# Patient Record
Sex: Female | Born: 1965 | Race: White | Hispanic: No | Marital: Married | Smoking: Never smoker
Health system: Southern US, Community
[De-identification: ages and names within clinical notes are randomized; demographics above are authoritative.]

## PROBLEM LIST (undated history)

## (undated) DIAGNOSIS — R55 Syncope and collapse: Secondary | ICD-10-CM

## (undated) DIAGNOSIS — J309 Allergic rhinitis, unspecified: Secondary | ICD-10-CM

## (undated) DIAGNOSIS — I10 Essential (primary) hypertension: Secondary | ICD-10-CM

## (undated) DIAGNOSIS — I5189 Other ill-defined heart diseases: Secondary | ICD-10-CM

## (undated) DIAGNOSIS — E119 Type 2 diabetes mellitus without complications: Secondary | ICD-10-CM

## (undated) DIAGNOSIS — K219 Gastro-esophageal reflux disease without esophagitis: Secondary | ICD-10-CM

## (undated) DIAGNOSIS — Z86718 Personal history of other venous thrombosis and embolism: Secondary | ICD-10-CM

## (undated) DIAGNOSIS — F419 Anxiety disorder, unspecified: Secondary | ICD-10-CM

## (undated) DIAGNOSIS — M4800 Spinal stenosis, site unspecified: Secondary | ICD-10-CM

## (undated) DIAGNOSIS — E890 Postprocedural hypothyroidism: Secondary | ICD-10-CM

## (undated) DIAGNOSIS — E0590 Thyrotoxicosis, unspecified without thyrotoxic crisis or storm: Secondary | ICD-10-CM

## (undated) DIAGNOSIS — R600 Localized edema: Secondary | ICD-10-CM

## (undated) DIAGNOSIS — M51379 Other intervertebral disc degeneration, lumbosacral region without mention of lumbar back pain or lower extremity pain: Secondary | ICD-10-CM

## (undated) DIAGNOSIS — E042 Nontoxic multinodular goiter: Secondary | ICD-10-CM

## (undated) DIAGNOSIS — M5137 Other intervertebral disc degeneration, lumbosacral region: Secondary | ICD-10-CM

## (undated) DIAGNOSIS — E7800 Pure hypercholesterolemia, unspecified: Secondary | ICD-10-CM

## (undated) DIAGNOSIS — W57XXXA Bitten or stung by nonvenomous insect and other nonvenomous arthropods, initial encounter: Secondary | ICD-10-CM

## (undated) DIAGNOSIS — M19019 Primary osteoarthritis, unspecified shoulder: Secondary | ICD-10-CM

## (undated) DIAGNOSIS — T7840XA Allergy, unspecified, initial encounter: Secondary | ICD-10-CM

## (undated) HISTORY — PX: EXPLORATORY LAPAROTOMY: SUR591

## (undated) HISTORY — PX: ROTATOR CUFF REPAIR: SHX139

## (undated) HISTORY — PX: BONE MARROW BIOPSY: SHX199

## (undated) HISTORY — PX: ENDOMETRIAL ABLATION: SHX621

## (undated) HISTORY — PX: CHOLECYSTECTOMY: SHX55

## (undated) HISTORY — DX: Allergy, unspecified, initial encounter: T78.40XA

## (undated) HISTORY — PX: BACK SURGERY: SHX140

## (undated) HISTORY — PX: MOUTH SURGERY: SHX715

## (undated) HISTORY — PX: DILATION AND CURETTAGE OF UTERUS: SHX78

## (undated) HISTORY — PX: ABDOMINAL HYSTERECTOMY: SHX81

---

## 1998-11-18 ENCOUNTER — Other Ambulatory Visit: Admission: RE | Admit: 1998-11-18 | Discharge: 1998-11-18 | Payer: Self-pay | Admitting: Obstetrics and Gynecology

## 1999-01-27 ENCOUNTER — Ambulatory Visit (HOSPITAL_BASED_OUTPATIENT_CLINIC_OR_DEPARTMENT_OTHER): Admission: RE | Admit: 1999-01-27 | Discharge: 1999-01-27 | Payer: Self-pay | Admitting: Oral Surgery

## 1999-03-16 ENCOUNTER — Encounter: Payer: Self-pay | Admitting: Family Medicine

## 1999-03-16 ENCOUNTER — Ambulatory Visit (HOSPITAL_COMMUNITY): Admission: RE | Admit: 1999-03-16 | Discharge: 1999-03-16 | Payer: Self-pay | Admitting: Family Medicine

## 1999-05-27 ENCOUNTER — Encounter: Admission: RE | Admit: 1999-05-27 | Discharge: 1999-08-25 | Payer: Self-pay | Admitting: Obstetrics and Gynecology

## 1999-11-30 ENCOUNTER — Inpatient Hospital Stay (HOSPITAL_COMMUNITY): Admission: AD | Admit: 1999-11-30 | Discharge: 1999-12-02 | Payer: Self-pay | Admitting: Obstetrics and Gynecology

## 1999-12-28 ENCOUNTER — Other Ambulatory Visit: Admission: RE | Admit: 1999-12-28 | Discharge: 1999-12-28 | Payer: Self-pay | Admitting: Gynecology

## 2001-03-26 ENCOUNTER — Other Ambulatory Visit: Admission: RE | Admit: 2001-03-26 | Discharge: 2001-03-26 | Payer: Self-pay | Admitting: Obstetrics and Gynecology

## 2001-09-02 ENCOUNTER — Encounter: Payer: Self-pay | Admitting: Family Medicine

## 2001-09-02 ENCOUNTER — Ambulatory Visit (HOSPITAL_COMMUNITY): Admission: RE | Admit: 2001-09-02 | Discharge: 2001-09-02 | Payer: Self-pay | Admitting: Family Medicine

## 2002-07-04 ENCOUNTER — Encounter: Payer: Self-pay | Admitting: Family Medicine

## 2002-07-04 ENCOUNTER — Ambulatory Visit (HOSPITAL_COMMUNITY): Admission: RE | Admit: 2002-07-04 | Discharge: 2002-07-04 | Payer: Self-pay | Admitting: Family Medicine

## 2002-07-11 ENCOUNTER — Other Ambulatory Visit: Admission: RE | Admit: 2002-07-11 | Discharge: 2002-07-11 | Payer: Self-pay | Admitting: Obstetrics and Gynecology

## 2003-03-10 ENCOUNTER — Inpatient Hospital Stay (HOSPITAL_COMMUNITY): Admission: RE | Admit: 2003-03-10 | Discharge: 2003-03-13 | Payer: Self-pay | Admitting: Obstetrics and Gynecology

## 2003-03-10 ENCOUNTER — Encounter (INDEPENDENT_AMBULATORY_CARE_PROVIDER_SITE_OTHER): Payer: Self-pay | Admitting: *Deleted

## 2003-03-15 ENCOUNTER — Inpatient Hospital Stay (HOSPITAL_COMMUNITY): Admission: AD | Admit: 2003-03-15 | Discharge: 2003-03-15 | Payer: Self-pay | Admitting: Obstetrics and Gynecology

## 2003-03-18 ENCOUNTER — Inpatient Hospital Stay (HOSPITAL_COMMUNITY): Admission: AD | Admit: 2003-03-18 | Discharge: 2003-03-20 | Payer: Self-pay | Admitting: Obstetrics and Gynecology

## 2003-03-22 ENCOUNTER — Inpatient Hospital Stay (HOSPITAL_COMMUNITY): Admission: AD | Admit: 2003-03-22 | Discharge: 2003-03-24 | Payer: Self-pay | Admitting: Obstetrics and Gynecology

## 2003-03-23 ENCOUNTER — Encounter: Payer: Self-pay | Admitting: Obstetrics and Gynecology

## 2003-04-05 ENCOUNTER — Inpatient Hospital Stay (HOSPITAL_COMMUNITY): Admission: AD | Admit: 2003-04-05 | Discharge: 2003-04-05 | Payer: Self-pay | Admitting: *Deleted

## 2003-04-06 ENCOUNTER — Ambulatory Visit (HOSPITAL_COMMUNITY): Admission: RE | Admit: 2003-04-06 | Discharge: 2003-04-06 | Payer: Self-pay | Admitting: Obstetrics and Gynecology

## 2003-04-06 ENCOUNTER — Encounter: Payer: Self-pay | Admitting: Obstetrics and Gynecology

## 2003-04-07 ENCOUNTER — Encounter: Payer: Self-pay | Admitting: Obstetrics and Gynecology

## 2003-04-07 ENCOUNTER — Ambulatory Visit (HOSPITAL_COMMUNITY): Admission: RE | Admit: 2003-04-07 | Discharge: 2003-04-07 | Payer: Self-pay | Admitting: Obstetrics and Gynecology

## 2003-04-08 ENCOUNTER — Encounter: Payer: Self-pay | Admitting: Gastroenterology

## 2003-04-08 ENCOUNTER — Ambulatory Visit (HOSPITAL_COMMUNITY): Admission: RE | Admit: 2003-04-08 | Discharge: 2003-04-08 | Payer: Self-pay | Admitting: Gastroenterology

## 2003-04-28 ENCOUNTER — Observation Stay (HOSPITAL_COMMUNITY): Admission: RE | Admit: 2003-04-28 | Discharge: 2003-04-29 | Payer: Self-pay | Admitting: General Surgery

## 2003-04-28 ENCOUNTER — Encounter: Payer: Self-pay | Admitting: General Surgery

## 2003-04-28 ENCOUNTER — Encounter (INDEPENDENT_AMBULATORY_CARE_PROVIDER_SITE_OTHER): Payer: Self-pay

## 2003-07-06 ENCOUNTER — Ambulatory Visit (HOSPITAL_COMMUNITY): Admission: RE | Admit: 2003-07-06 | Discharge: 2003-07-06 | Payer: Self-pay | Admitting: Oncology

## 2003-07-06 ENCOUNTER — Encounter (INDEPENDENT_AMBULATORY_CARE_PROVIDER_SITE_OTHER): Payer: Self-pay | Admitting: *Deleted

## 2003-09-02 ENCOUNTER — Encounter: Admission: RE | Admit: 2003-09-02 | Discharge: 2003-09-02 | Payer: Self-pay | Admitting: Infectious Diseases

## 2003-10-02 ENCOUNTER — Ambulatory Visit (HOSPITAL_COMMUNITY): Admission: RE | Admit: 2003-10-02 | Discharge: 2003-10-02 | Payer: Self-pay | Admitting: Obstetrics and Gynecology

## 2003-10-28 ENCOUNTER — Encounter (INDEPENDENT_AMBULATORY_CARE_PROVIDER_SITE_OTHER): Payer: Self-pay | Admitting: Specialist

## 2003-10-28 ENCOUNTER — Inpatient Hospital Stay (HOSPITAL_COMMUNITY): Admission: AD | Admit: 2003-10-28 | Discharge: 2003-11-02 | Payer: Self-pay | Admitting: Obstetrics and Gynecology

## 2003-11-10 ENCOUNTER — Inpatient Hospital Stay (HOSPITAL_COMMUNITY): Admission: AD | Admit: 2003-11-10 | Discharge: 2003-11-10 | Payer: Self-pay | Admitting: Obstetrics and Gynecology

## 2003-11-19 ENCOUNTER — Ambulatory Visit (HOSPITAL_COMMUNITY): Admission: RE | Admit: 2003-11-19 | Discharge: 2003-11-19 | Payer: Self-pay | Admitting: Obstetrics and Gynecology

## 2003-12-03 ENCOUNTER — Ambulatory Visit (HOSPITAL_COMMUNITY): Admission: RE | Admit: 2003-12-03 | Discharge: 2003-12-03 | Payer: Self-pay | Admitting: Obstetrics and Gynecology

## 2004-03-22 ENCOUNTER — Other Ambulatory Visit: Admission: RE | Admit: 2004-03-22 | Discharge: 2004-03-22 | Payer: Self-pay | Admitting: Obstetrics and Gynecology

## 2005-05-11 ENCOUNTER — Other Ambulatory Visit: Admission: RE | Admit: 2005-05-11 | Discharge: 2005-05-11 | Payer: Self-pay | Admitting: Obstetrics and Gynecology

## 2005-09-24 ENCOUNTER — Emergency Department (HOSPITAL_COMMUNITY): Admission: EM | Admit: 2005-09-24 | Discharge: 2005-09-24 | Payer: Self-pay | Admitting: Emergency Medicine

## 2007-06-07 ENCOUNTER — Ambulatory Visit (HOSPITAL_COMMUNITY): Admission: RE | Admit: 2007-06-07 | Discharge: 2007-06-07 | Payer: Self-pay | Admitting: Obstetrics and Gynecology

## 2008-12-03 ENCOUNTER — Encounter: Admission: RE | Admit: 2008-12-03 | Discharge: 2008-12-03 | Payer: Self-pay | Admitting: Gastroenterology

## 2009-12-02 ENCOUNTER — Encounter: Admission: RE | Admit: 2009-12-02 | Discharge: 2009-12-02 | Payer: Self-pay | Admitting: Obstetrics and Gynecology

## 2010-03-16 ENCOUNTER — Encounter (INDEPENDENT_AMBULATORY_CARE_PROVIDER_SITE_OTHER): Payer: Self-pay | Admitting: Specialist

## 2010-03-17 ENCOUNTER — Inpatient Hospital Stay (HOSPITAL_COMMUNITY): Admission: RE | Admit: 2010-03-17 | Discharge: 2010-03-20 | Payer: Self-pay | Admitting: Specialist

## 2010-03-23 ENCOUNTER — Ambulatory Visit (HOSPITAL_COMMUNITY): Admission: RE | Admit: 2010-03-23 | Discharge: 2010-03-23 | Payer: Self-pay | Admitting: Specialist

## 2010-03-23 ENCOUNTER — Ambulatory Visit: Payer: Self-pay | Admitting: Surgery

## 2010-03-23 ENCOUNTER — Encounter (INDEPENDENT_AMBULATORY_CARE_PROVIDER_SITE_OTHER): Payer: Self-pay | Admitting: Specialist

## 2010-10-02 ENCOUNTER — Encounter: Payer: Self-pay | Admitting: Obstetrics and Gynecology

## 2010-11-27 LAB — URINALYSIS, ROUTINE W REFLEX MICROSCOPIC
Bilirubin Urine: NEGATIVE
Glucose, UA: >1000 mg/dL — AB
Hgb urine dipstick: NEGATIVE
Ketones, ur: NEGATIVE mg/dL
Nitrite: NEGATIVE
Protein, ur: NEGATIVE mg/dL
Specific Gravity, Urine: 1.027 (ref 1.005–1.030)
Urobilinogen, UA: 1 mg/dL (ref 0.0–1.0)
pH: 5.5 (ref 5.0–8.0)

## 2010-11-27 LAB — GLUCOSE, CAPILLARY
Glucose-Capillary: 117 mg/dL — ABNORMAL HIGH (ref 70–99)
Glucose-Capillary: 132 mg/dL — ABNORMAL HIGH (ref 70–99)
Glucose-Capillary: 135 mg/dL — ABNORMAL HIGH (ref 70–99)
Glucose-Capillary: 141 mg/dL — ABNORMAL HIGH (ref 70–99)
Glucose-Capillary: 151 mg/dL — ABNORMAL HIGH (ref 70–99)
Glucose-Capillary: 158 mg/dL — ABNORMAL HIGH (ref 70–99)
Glucose-Capillary: 165 mg/dL — ABNORMAL HIGH (ref 70–99)
Glucose-Capillary: 167 mg/dL — ABNORMAL HIGH (ref 70–99)
Glucose-Capillary: 172 mg/dL — ABNORMAL HIGH (ref 70–99)
Glucose-Capillary: 179 mg/dL — ABNORMAL HIGH (ref 70–99)
Glucose-Capillary: 180 mg/dL — ABNORMAL HIGH (ref 70–99)
Glucose-Capillary: 183 mg/dL — ABNORMAL HIGH (ref 70–99)
Glucose-Capillary: 215 mg/dL — ABNORMAL HIGH (ref 70–99)
Glucose-Capillary: 226 mg/dL — ABNORMAL HIGH (ref 70–99)
Glucose-Capillary: 228 mg/dL — ABNORMAL HIGH (ref 70–99)
Glucose-Capillary: 236 mg/dL — ABNORMAL HIGH (ref 70–99)
Glucose-Capillary: 243 mg/dL — ABNORMAL HIGH (ref 70–99)
Glucose-Capillary: 253 mg/dL — ABNORMAL HIGH (ref 70–99)
Glucose-Capillary: 253 mg/dL — ABNORMAL HIGH (ref 70–99)

## 2010-11-27 LAB — PROTIME-INR
INR: 0.92 (ref 0.00–1.49)
Prothrombin Time: 12.3 seconds (ref 11.6–15.2)

## 2010-11-27 LAB — COMPREHENSIVE METABOLIC PANEL
ALT: 35 U/L (ref 0–35)
AST: 20 U/L (ref 0–37)
Albumin: 3.9 g/dL (ref 3.5–5.2)
Alkaline Phosphatase: 112 U/L (ref 39–117)
BUN: 8 mg/dL (ref 6–23)
CO2: 34 mEq/L — ABNORMAL HIGH (ref 19–32)
Calcium: 9.3 mg/dL (ref 8.4–10.5)
Chloride: 99 mEq/L (ref 96–112)
Creatinine, Ser: 0.67 mg/dL (ref 0.4–1.2)
GFR calc Af Amer: >60 mL/min (ref >60)
GFR calc non Af Amer: >60 mL/min (ref >60)
Glucose, Bld: 269 mg/dL — ABNORMAL HIGH (ref 70–99)
Potassium: 3.4 mEq/L — ABNORMAL LOW (ref 3.5–5.1)
Sodium: 140 mEq/L (ref 135–145)
Total Bilirubin: 0.5 mg/dL (ref 0.3–1.2)
Total Protein: 6.9 g/dL (ref 6.0–8.3)

## 2010-11-27 LAB — CBC
HCT: 41.2 % (ref 36.0–46.0)
Hemoglobin: 14.6 g/dL (ref 12.0–15.0)
MCH: 30.8 pg (ref 26.0–34.0)
MCHC: 35.5 g/dL (ref 30.0–36.0)
MCV: 86.8 fL (ref 78.0–100.0)
Platelets: 233 10*3/uL (ref 150–400)
RBC: 4.74 MIL/uL (ref 3.87–5.11)
RDW: 12.6 % (ref 11.5–15.5)
WBC: 7 10*3/uL (ref 4.0–10.5)

## 2010-11-27 LAB — APTT: aPTT: 29 seconds (ref 24–37)

## 2010-11-27 LAB — HEMOGLOBIN A1C
Hgb A1c MFr Bld: 11.7 % — ABNORMAL HIGH (ref <5.7)
Mean Plasma Glucose: 289 mg/dL — ABNORMAL HIGH (ref <117)

## 2010-11-27 LAB — URINE MICROSCOPIC-ADD ON

## 2010-11-27 LAB — SURGICAL PCR SCREEN

## 2010-11-27 LAB — MRSA CULTURE

## 2011-01-27 NOTE — Op Note (Signed)
NAME:  Nicole Crane, Nicole Crane                      ACCOUNT NO.:  [id number]   MEDICAL RECORD NO.:  [id number]                   PATIENT TYPE:  AMB   LOCATION:  DAY                                  FACILITY:  [facility]   PHYSICIAN:  Timothy E. Earlene Plater, M.D.              DATE OF BIRTH:  Jul 23, 1966   DATE OF PROCEDURE:  04/28/2003  DATE OF DISCHARGE:                                 OPERATIVE REPORT   PREOPERATIVE DIAGNOSIS:  Acute and chronic acalculus cholecystitis.   POSTOPERATIVE DIAGNOSIS:  Acute and chronic acalculus cholecystitis.   PROCEDURE:  Laparoscopic cholecystectomy and operative cholangiogram.   SURGEON:  Timothy E. Earlene Plater, M.D.   ASSISTANT:  Lebron Conners, M.D.   ANESTHESIA:  General.   INDICATIONS:  Ms. Padgett is 67, has acute onset of right upper quadrant  pain (food related), with radiation to the back.  She had been in the  emergency room, has visited her primary care physician, her  gastroenterologist and the gynecologist, including me -- we all agree that  cholecystectomy is in order.  She is in agreement and is anxious to proceed.  The procedure has been carefully explained to her and her husband, as the  expectations and potential complications.  She was identified and the permit  signed.   DESCRIPTION OF PROCEDURE:  She was taken to the operating room, placed  supine, general endotracheal anesthesia administered.  The abdomen was  scrubbed, prepped and draped in the usual fashion.  A supraumbilical  vertical incision was made.  The fascia was very deep in the subcutaneous  fat; it was clearly identified, opened in the midline, grasped with  Kocher's, drawn up and the peritoneum entered without complication.  A #1  Vicryl stitch was placed across the fascia.  The Hasson catheter placed  through the suture, and it was tied.  The abdomen was insufflated.  The  patient was placed in the head-down position.  There was some inflammatory  change to the right lower  quadrant, but nothing acute or complicating.  One  picture was made.  She was then placed in the routine head-up position.  A  10 mm trocar placed in the mid epigastrium under direct vision.  Two 5 mm  trocars placed in the right upper quadrant under direct vision.  The  gallbladder was tense; had a few omental adhesions, and was at least 50%  intrahepatic.  It was grasped, carefully placed on tension.  Very careful  dissection at the base of gallbladder revealed an anterior cystic artery,  which was triply clipped and divided.  We then identified the cystic duct,  which was inferior.  This was very carefully dissected out and a complete  window was made posteriorly.  No other structures were seen, except a second  small artery.  A clip was placed at the junction of the cystic duct and  gallbladder.  A small incision was made in the cystic  duct and with a  percutaneously placed Reddick catheter, this was placed into the cystic duct  remnant; real-time fluoroscopy was used to complete a cholangiogram, which  showed smooth and rapid filling of the biliary system into the duodenum,  with further dye and in the head-down position the hepatic radicals also  filled.  They all seemed normal and there was no abnormality.  With this,  the clip was removed.  The cystic duct catheter removed, and the short  cystic duct remnant carefully doubly clipped.  It was fully divided.  The  artery was dissected posterior to this, and was triply clipped and divided.  The gallbladder was then removed from the gallbladder bed, with some  difficulty.  It was very intensely imbedded in the liver.  The gallbladder  was opened in two locations, with minor spillage of bile.  The gallbladder  was removed without further difficulty.  The gallbladder bed was carefully  inspected and was dry.  Copious irrigation was used.  The gallbladder was  placed in an Endo catch bag, and further irrigation carried out both in the   pelvis and in the upper abdomen.  The gallbladder was removed through the  supraumbilical incision, in the Endo catch bag, and submitted to pathology.  Further irrigation was carried out until completely clear.  Then, under  direct vision, all irrigation, CO2, instruments and trocars were removed.  Counts were correct.  She tolerated it well.  The skin incisions were  inspected and then closed with  4-0 Monocryl sutures.  Steri-Strips and dry sterile dressing applied.  Final  counts were correct.  She was removed to the recovery room in good  condition, after extubation.                                               Timothy E. Earlene Plater, M.D.    TED/MEDQ  D:  04/28/2003  T:  04/28/2003  Job:  [id number]   cc:   Marcelino Duster L. Vincente Poli, M.D.  [address]  [place]  [place] [address]  Fax: [phone]   Anselmo Rod, M.D.  [address]  [address]  [place]  [place] [address]  Fax: [phone]   Holley Bouche, M.D.  [address]  [place], [place] [address]  Fax: [phone]

## 2011-01-27 NOTE — Discharge Summary (Signed)
NAME:  Nicole Crane, Nicole Crane                      ACCOUNT NO.:  [id number]   MEDICAL RECORD NO.:  [id number]                   PATIENT TYPE:  INP   LOCATION:  9309                                 FACILITY:  [facility]   PHYSICIAN:  Michelle L. Vincente Poli, M.D.            DATE OF BIRTH:  [birth year]   DATE OF ADMISSION:  10/28/2003  DATE OF DISCHARGE:  11/02/2003                                 DISCHARGE SUMMARY   ADMISSION DIAGNOSIS:  Complex right adnexal mass.   DISCHARGE DIAGNOSIS:  Complex right adnexal mass.   HOSPITAL COURSE:  The patient is a 45 year old female who was admitted for  exploratory laparotomy for an adnexal mass.  The day of the surgery the  patient underwent exploratory laparotomy with BSO, extensive lysis of  adhesions, and pelvic exploration.  Because of the bowel adhesions involved  we did have Dr. Lurene Shadow scrub in to investigate the bowel and to examine the  appendix.  EBL was approximately 300 mL.  On the day after surgery the  patient's white count was 16.2, her hemoglobin was 11.2.  She was doing very  well, complaining only of some burning around her incision.  We slowly  advanced her diet because of the extensive lysis of adhesions.  She did  develop some thrush because of the antibiotics that she was on  postoperatively, which is not unusual for this patient, and this was treated  with Diflucan and Magic Mouthwash.  The patient did have some problems in  postoperative day #3 and postoperative day  #4 where she felt very anxious, especially at night.  She did have a small  temperature on postoperative day #3 in the evening of 101.7.  Her white  blood cell count was 11.2 on postoperative day #4 and hemoglobin was 8.4.  We continued the IV Cipro and watched her for an additional day.  She  remained afebrile for an additional 24 hours.  She was discharged home in  good condition on postoperative day #5.  She was given Cipro to take 250  b.i.d., Magic Mouthwash, and  estrogen patch.  She was advised to call if she  has any temperature greater than 100.1, nausea, vomiting, abdominal pain, or  any discomfort.  She was advised no driving for 1 week and she will follow  up on Wednesday to remove staples.                                               Michelle L. Vincente Poli, M.D.    Florestine Avers  D:  11/25/2003  T:  11/27/2003  Job:  [id number]

## 2011-01-27 NOTE — Op Note (Signed)
NAME:  Nicole Crane, Nicole Crane                      ACCOUNT NO.:  [id number]   MEDICAL RECORD NO.:  [id number]                   PATIENT TYPE:  INP   LOCATION:  9309                                 FACILITY:  [facility]   PHYSICIAN:  Leonie Man, M.D.                DATE OF BIRTH:  [birth year]   DATE OF PROCEDURE:  10/28/2003  DATE OF DISCHARGE:                                 OPERATIVE REPORT   PREOPERATIVE DIAGNOSES:  Pelvic adhesions and tubo-ovarian abscess with  right pelvic pain.   POSTOPERATIVE DIAGNOSES:  Pelvic adhesions and tubo-ovarian abscess with  right pelvic pain.   SURGEON:  Leonie Man, M.D.   REASON FOR CONSULT/PROCEDURE:  Note the patient is a 45 year old woman with  a history of chronic pelvic pain primarily on the right side who was being  operated on by Dr. Marcelle Overlie for this.  At the time I entered the  operating room she had already done a left salpingo-oophorectomy and was in  the process of doing a right salpingo-oophorectomy.  Dr. Vic Blackbird  and Dr. Phyllis Ginger were already at the operating room table.  Dr. Vic Blackbird was called in for better positive identification of the right  ureter which was scarred down into the retroperitoneum and around what  looked to be a chronic, but sterile tubo-ovarian abscess.  The distal ileum  and portions of the right ovary and tube were densely adherent to the  pelvis.  I assisted in identification of the right ureter and as well freed  adhesions in the pelvis that were adhering the distal ileum.  This was freed  and the distal ileum was run for a distance of approximately one and a half  to two feet.  There were not noted to be any abnormalities in the bowel or  in the mesentery.  Having assured myself that there was not any other  pathology, I left the operative suite and Dr. Vincente Poli and Dr. Rosalia Hammers continued  in the closure of the patient.                                               Leonie Man,  M.D.    PB/MEDQ  D:  10/28/2003  T:  10/28/2003  Job:  [id number]   cc:   Marcelino Duster L. Vincente Poli, M.D.  [address], [address] [place]  [place] [address]  Fax: [phone]

## 2011-01-27 NOTE — H&P (Signed)
   NAME:  Nicole Crane, Nicole Crane                      ACCOUNT NO.:  [id number]   MEDICAL RECORD NO.:  [id number]                   PATIENT TYPE:  INP   LOCATION:  9139                                 [facility]ACILITY[facility]   PHYSICIAN:  Michelle L. Vincente Poli, M.D.            DATE OF BIRTH:  10-May-1966   DATE OF ADMISSION:  03/18/2003  DATE OF DISCHARGE:                                HISTORY & PHYSICAL   HISTORY OF PRESENT ILLNESS:  This patient is a 45 year old gravida 4, para  3, status post LAVH on March 10, 2003 who presents for admission for cuff  cellulitis.  She reports developing over the past few days a low-grade  temperature with some chills and some nausea and lower abdominal pain.  Last  night she noticed a copious amount of brownish discharge per vagina.  She  was seen in the emergency room on Sunday, was diagnosed with a urinary tract  infection, had a normal white count and a hemoglobin and was discharged home  with Keflex 500 mg b.i.d.  She reports she has been taking her Keflex.   ALLERGIES:  1. PENICILLIN.  2. ALCOHOL.  3. ANTIHISTAMINES.  4. PREDNISONE.   MEDICATIONS:  Include HCTZ.  She is also on Keflex and Tylox and ibuprofen.   REVIEW OF SYSTEMS:  As above.   PHYSICAL EXAMINATION:  VITAL SIGNS:  On admission temperature is 100.8, BP  is 126/80.  GENERAL:  She is alert and oriented in moderate distress.  LUNGS:  Clear to auscultation bilaterally.  CARDIAC:  Regular, rate and rhythm.  ABDOMEN:  Soft.  She has some tenderness, mild, minimal tenderness in the  lower abdomen but no rebound or guarding.  Incisions are clean, dry and  intact.  PELVIC:  Speculum exam is performed no discharge is noted but she does have  a moderate amount of pelvic tenderness with no masses.   IMPRESSION:  Status post laparoscopic-assisted vaginal hysterectomy with  probable cuff cellulitis.   PLAN:  Admit to the [facility] for IV fluids, IV gentamicin and  clindamycin and Toradol and  will follow temperature curve closely and we  will obtain a urine culture.                                                Michelle L. Vincente Poli, M.D.    Florestine Avers  D:  03/18/2003  T:  03/18/2003  Job:  [id number]

## 2011-01-27 NOTE — Discharge Summary (Signed)
   NAME:  Nicole Crane, Nicole Crane                      ACCOUNT NO.:  [id number]   MEDICAL RECORD NO.:  [id number]                   PATIENT TYPE:  INP   LOCATION:  9139                                 [facility]ILITY[facility]   PHYSICIAN:  Michelle L. Vincente Poli, M.D.            DATE OF BIRTH:  Jan 16, 1966   DATE OF ADMISSION:  03/18/2003  DATE OF DISCHARGE:  03/20/2003                                 DISCHARGE SUMMARY   ADMISSION DIAGNOSES:  Status post vaginal hysterectomy with cuff cellulitis.   DISCHARGE DIAGNOSES:  Status post vaginal hysterectomy with cuff cellulitis.   HOSPITAL COURSE:  The patient is a 45 year old gravida 4, para 3 who  underwent a vaginal hysterectomy on June 29.  The patient presents with a  low grade temperature with some chills, some nausea, and lower abdominal  pain.  The patient also has had a urinary tract infection.  Was currently on  Keflex 500 mg t.i.d.  On admission VITAL SIGNS:  Temperature 100.8, blood  pressure 126/80.  White blood cell count is normal at 9.5.  Hemoglobin is  10.9.  She is noted to have a large amount of hemoglobin and a moderate  amount of leukocyte esterase in her urine.  The patient is admitted and  placed on IV antibiotics as well as Toradol.  The patient's temperature is  followed closely and by hospital day #3 is doing much better.  She has  remained afebrile with stable vital signs and she is discharged home with  ciprofloxacin for seven days and to follow up in the office and to call if  she has any temperature greater than 100.5, nausea, vomiting, severe  abdominal pain, or vaginal drainage.                                               Michelle L. Vincente Poli, M.D.    Florestine Avers  D:  04/13/2003  T:  04/13/2003  Job:  [id number]

## 2011-01-27 NOTE — Discharge Summary (Signed)
   NAME:  Nicole Crane, ARPIN                      ACCOUNT NO.:  [id number]   MEDICAL RECORD NO.:  [id number]                   PATIENT TYPE:  INP   LOCATION:  9302                                 [facility]ILITY[facility]   PHYSICIAN:  Michelle L. Vincente Poli, M.D.            DATE OF BIRTH:  1966/01/16   DATE OF ADMISSION:  03/10/2003  DATE OF DISCHARGE:  03/13/2003                                 DISCHARGE SUMMARY   ADMISSION DIAGNOSES:  1. Menorrhagia.  2. Pelvic pain.   DISCHARGE DIAGNOSES:  1. Menorrhagia.  2. Pelvic pain.   HOSPITAL COURSE:  A 45 year old female who is admitted for a total vaginal  hysterectomy.  At the time of surgery EBL 200 mL and surgery was  uncomplicated.  The patient did very well postoperatively.  She had some  lower abdominal pain and cramping postoperative day one.  Her white blood  cell count was 12.3, hemoglobin 10.9.  She remained afebrile with stable  vital signs.  She was discharged home on postoperative day #3 in good  condition.  She was given Tylox and ibuprofen to take as needed for pain.  She was advised to call if she has any fever greater than 100.5, nausea,  vomiting, severe abdominal pain.  She was also given a Z-pack.  She had some  nasal stuffiness afterwards as well and she will follow up with me in the  office in one week.                                               Michelle L. Vincente Poli, M.D.    Florestine Avers  D:  04/22/2003  T:  04/22/2003  Job:  [id number]

## 2011-01-27 NOTE — Op Note (Signed)
NAME:  Nicole Crane, SLAVICK                      ACCOUNT NO.:  [id number]   MEDICAL RECORD NO.:  [id number]                   PATIENT TYPE:  INP   LOCATION:  9309                                 [facility]ACILITY[facility]   PHYSICIAN:  Rozanna Boer., M.D.      DATE OF BIRTH:  07/28/1966   DATE OF PROCEDURE:  10/28/2003  DATE OF DISCHARGE:                                 OPERATIVE REPORT   PREOPERATIVE DIAGNOSIS:  Tubo-ovarian abscess, right.   POSTOPERATIVE DIAGNOSIS:  Tubo-ovarian abscess, right.   OPERATION PERFORMED:  Exploration of the right retroperitoneum.   SURGEON:  Courtney Paris, M.D.   ASSISTANT:  Stann Mainland. Vincente Poli, M.D.   ANESTHESIA:  General.   INDICATIONS FOR PROCEDURE:  This 45 year old patient was being explored by  Dr. Vincente Poli for tubo-ovarian abscess about a year post hysterectomy.  The  right tubo-ovarian abscess seemed to be quite inflammatory and possibly  wrapped around her ureter and Dr. Vincente Poli had a difficult time finding this.  I was called to the operating room for intraoperative consult.   DESCRIPTION OF PROCEDURE:  The patient's belly was opened and retractors  were in place.  The cecum was retracted anteriorly and the retroperitoneum  over the iliac vessel was then incised.  Sharp and blunt dissection revealed  the ureter just below this well away from the tubo-ovarian abscess. Dr.  Vincente Poli was then able to complete the resection of this TOA without fear of  injuring the right ureter.  The patient had previously been given some  indigo carmine intravenously.  She did have a blue colored urine.  The  ureter did seem to be slightly dilated but looked to be normal in appearance  otherwise.  The retroperitoneum was fairly inflammatory and edematous as  well.  The rest of the procedure will be dictated by Dr. Vincente Poli separately.                                               Rozanna Boer., M.D.    HMK/MEDQ  D:  10/28/2003  T:  10/29/2003   Job:  [id number]   cc:   Marcelino Duster L. Vincente Poli, M.D.  [address], [address] [place]  [place] [address]  Fax: [phone]

## 2011-01-27 NOTE — Op Note (Signed)
NAME:  Nicole Crane, Nicole Crane                      ACCOUNT NO.:  [id number]   MEDICAL RECORD NO.:  [id number]                   PATIENT TYPE:  INP   LOCATION:  9302                                 [facility]   PHYSICIAN:  Michelle L. Vincente Poli, M.D.            DATE OF BIRTH:  [birth year]   DATE OF PROCEDURE:  03/10/2003  DATE OF DISCHARGE:  03/13/2003                                 OPERATIVE REPORT   PREOPERATIVE DIAGNOSES:  1. Menorrhagia.  2. Pelvic pain.   POSTOPERATIVE DIAGNOSES:  1. Menorrhagia.  2. Pelvic pain.   PROCEDURE:  Total vaginal hysterectomy.   SURGEON:  Michelle L. Vincente Poli, M.D.   ASSISTANT:  Raynald Kemp, M.D.   ANESTHESIA:  General.   DESCRIPTION OF PROCEDURE:  The patient was taken to the operating room after  informed consent was obtained.  She was intubated in standard fashion.  We  had originally consented for laparoscopically-assisted vaginal hysterectomy.  The patient was prepped and draped in the usual sterile fashion and uterine  manipulator was inserted into the uterus.  A Foley catheter was inserted  into the bladder after sterile drapes were applied.  A small infraumbilical  incision was made and the patient was noted to have significant ventral  obesity, and the Veress needle was not long enough to reach the peritoneal  cavity.  I tried two times to insert the Veress needle but was not able to  get intraperitoneal placement.  At this point we decided to just proceed  with vaginal hysterectomy and the Veress needle was removed and the incision  was closed with 3-0 Vicryl interrupted.  The patient's legs were placed in  high lithotomy, the cervix was grasped with a tenaculum, and circumferential  incision was made of the cervix and overlying vaginal epithelium was  dissected free anteriorly, posteriorly, and laterally.  The posterior cul-de-  sac was entered without difficulty using Mayo scissors, and a weighted  speculum was inserted into the  cul-de-sac.  In a likewise fashion, the  anterior cul-de-sac was then entered sharply, and there were noted to be no  adhesions either anteriorly or posteriorly.  Using curved Heaney clamps, the  uterosacral ligaments were clamped on either side.  Each pedicle was cut and  suture ligated using 0 Vicryl suture.  We worked out way up to the uterus  along the broad ligaments using curved Heaney clamps with good hemostasis,  and each pedicle was cut and suture ligated using 0 Vicryl suture.  Once we  reached the level of the triple pedicle, the uterus was then retroflexed and  curved Heaney clamps were placed across each triple pedicle.  The specimen  was removed using Mayo scissors and the pedicles were then secured using  both a suture ligature of 0 Vicryl suture and a free tie of 0 Vicryl suture.  All pedicles were inspected, hemostasis was noted.  The ovaries were  inspected and noted  to be normal.  A cul-de-sac stitch was then placed using  0 Vicryl in standard fashion and the posterior cuff was closed in a  continuous running locked stitch using 0 Vicryl.  The remainder of the cuff  was closed from anterior to posterior in a continuous running locked stitch  using 2-0 Vicryl in a continuous running locked stitch.  At the end of the  procedure no vaginal bleeding was noted.  All sponge, lap, and instrument  counts were correct x2.  The patient tolerated the procedure well and went  to the recovery room in stable condition.                                               Michelle L. Vincente Poli, M.D.    Florestine Avers  D:  04/13/2003  T:  04/13/2003  Job:  [id number]

## 2011-01-27 NOTE — Op Note (Signed)
NAME:  Nicole Crane, Nicole Crane                      ACCOUNT NO.:  [id number]   MEDICAL RECORD NO.:  [id number]                   PATIENT TYPE:  INP   LOCATION:  9309                                 FACILITY:  [facility]   PHYSICIAN:  Michelle L. Vincente Poli, M.D.            DATE OF BIRTH:  24-Nov-1965   DATE OF PROCEDURE:  10/28/2003  DATE OF DISCHARGE:                                 OPERATIVE REPORT   PREOPERATIVE DIAGNOSIS:  Complex thickened adnexal mass.   POSTOPERATIVE DIAGNOSIS:  Complex thickened adnexal mass.   PROCEDURE:  1. Exploratory laparotomy.  2. Bilateral salpingo-oophorectomy.  3. Extensive lysis of adhesions.  4. Pelvic exploration.   SURGEON:  Michelle L. Vincente Poli, M.D.   ASSISTANT:  Raynald Kemp, M.D.   INTRAOPERATIVE CONSULTS:  Courtney Paris, M.D.  Leonie Man, M.D.   ESTIMATED BLOOD LOSS:  300 cc.   DESCRIPTION OF PROCEDURE:  The patient was taken to the operating room and  she was then sedated.  She was prepped and draped in the usual sterile  fashion.  A Foley catheter was entered into the bladder.  A sterile drape  was applied.  A low transverse incision was made and carried down to the  fascia.  The fascia was scored in the midline and extended laterally.  A  Pfannenstiel incision was developed.  The rectus muscles were separated in  the midline and the peritoneum was entered bluntly.  The peritoneal incision  was then stretched.  The self-retaining retractor was placed in the  abdominal cavity, and exploration of the pelvis revealed the following:  Normal left tube and ovary.  Multiple adhesions involving the bowel along  the right pelvic sidewall.   We then packed the bowel as best we could into the upper abdomen, and then  inspected the pelvis.  The left tip of the ovary was easily visualized and  it was grasped with a Babcock.  It was slightly adherent to the sidewall,  but it was released with blunt dissection.  We placed a Heaney clamp across  the  IP ligament.  The specimen was removed and suture-ligated using 0 Vicryl  suture; then further secured using a free-tie of 0 Vicryl. It was  hemostatic.   Attention was then turned to the right side.  There were multiple adhesions  involving the cecum, the ileus and the small bowel to the right adnexa.  We  tried blunt dissection to release as many adhesions as possible.  Upon  release of some of the adhesions, we noted that there was a large, what  appeared to be, TOA deep in the pelvis.  We then drained part of the TOA,  and it appeared that part of it has fibrium in the middle. There was also a  sterile pocket of pus up around the cecum.  At that point, we decided to  proceed with a general surgical consultation to inspect the bowel as well as  the appendix.  We then lifted the TOA up as best we could, and attempted to  visualize the ureter.  However, the peritoneum was extremely thickened and  inflamed around this, and we were unable to visualize the ureter completely,  despite giving indigo carmine IV.  There was no spillage of blue dye noted.   We then called Dr. Aldean Ast of urology to come in.  At this point he and  Dr. Lurene Shadow both arrived at the same time and scrubbed in.  Dr. Aldean Ast  then dissected the ureter and identified it well below our TOA.  Part of the  surgery will be dictated by him.  In his presence, I then placed Heaney  clamps just beneath the TOA, and ________ we had identified the ureter.  Heaney clamps were placed across each pedicle, cut and suture ligated using  0 Vicryl suture.   After the [facility] was removed and Dr. Aldean Ast scrubbed out, then Dr. Lurene Shadow  proceeded with a bowel exploration.  This will be dictated by Dr. Lurene Shadow.  Briefly, he inspected the appendix and found it was retrocecal and found the  cecum to be normal.  At this point irrigation was performed.  Hemostasis was  noted.  We placed an Interceed along the right pelvic sidewall where the TOA   was removed.  All instruments and laparotomy pads removed from the abdominal  cavity.  The peritoneum was closed using 0 Vicryl continuous running stitch.  The rectus muscles were reapproximated using the same 0 Vicryl.  The fascia  was closed using #0 PDS suture x2, using a continuous running stitch.  The  subcutaneous tissue was noted to be hemostatic.  It was closed using  interrupted plain gut suture.  The skin was closed with staples.  All  sponge, lap and instrument counts were correct x2.  The patient tolerated  the procedure well and returned to the recovery room in stable condition.                                               Michelle L. Vincente Poli, M.D.    Florestine Avers  D:  10/28/2003  T:  10/29/2003  Job:  [id number]

## 2011-05-16 IMAGING — CR DG OR PORTABLE SPINE
1 series · 1 of 1 positions shown · non-contrast
Comparison: 03/16/2010

CLINICAL DATA: L5 S1 herniated disc, intraoperative localization

PORTABLE SPINE

[series 1]
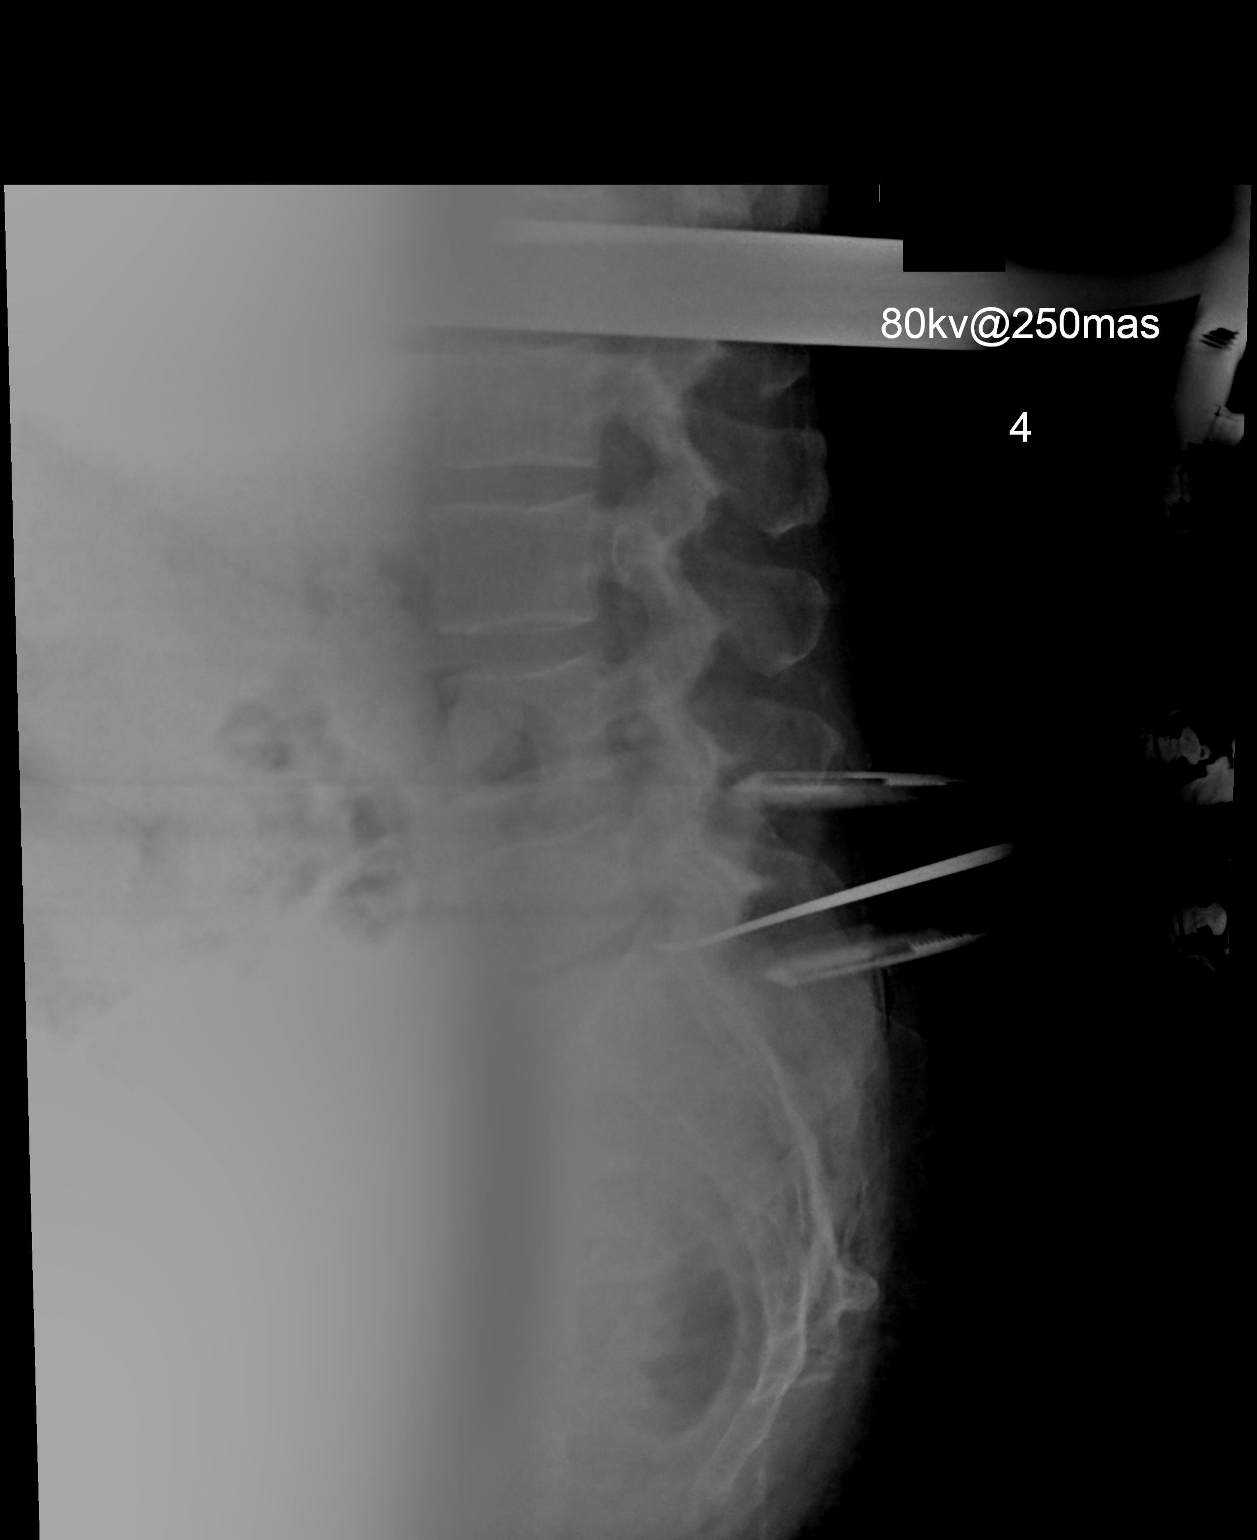

[1 of 1 positions shown; findings below may reference images not displayed]

FINDINGS: Portable intraoperative lateral lumbar spine obtained
03/16/2010 at 6989 hours demonstrates a radiopaque localizer
posterior to the L5 S1 disc space.  Degenerative disc disease noted
at L5 S1.
IMPRESSION: Intraoperative L5 S1 localization.

## 2012-03-20 ENCOUNTER — Other Ambulatory Visit: Payer: Self-pay | Admitting: Obstetrics and Gynecology

## 2012-06-20 ENCOUNTER — Other Ambulatory Visit (HOSPITAL_COMMUNITY): Payer: Self-pay | Admitting: Neurosurgery

## 2012-06-20 DIAGNOSIS — M545 Low back pain, unspecified: Secondary | ICD-10-CM

## 2012-06-20 DIAGNOSIS — M5410 Radiculopathy, site unspecified: Secondary | ICD-10-CM

## 2012-06-24 ENCOUNTER — Other Ambulatory Visit (HOSPITAL_COMMUNITY): Payer: Self-pay

## 2012-06-26 ENCOUNTER — Ambulatory Visit (HOSPITAL_COMMUNITY)
Admission: RE | Admit: 2012-06-26 | Discharge: 2012-06-26 | Disposition: A | Discharge status: Home / Self Care | Payer: Worker's Compensation | Source: Ambulatory Visit | Attending: Neurosurgery | Admitting: Neurosurgery

## 2012-06-26 DIAGNOSIS — M545 Low back pain, unspecified: Secondary | ICD-10-CM

## 2012-06-26 DIAGNOSIS — M5410 Radiculopathy, site unspecified: Secondary | ICD-10-CM

## 2013-04-09 ENCOUNTER — Other Ambulatory Visit: Payer: Self-pay | Admitting: Obstetrics and Gynecology

## 2014-04-22 ENCOUNTER — Other Ambulatory Visit: Payer: Self-pay | Admitting: Obstetrics and Gynecology

## 2014-04-23 LAB — CYTOLOGY - PAP

## 2015-02-20 ENCOUNTER — Encounter (HOSPITAL_COMMUNITY): Payer: Self-pay

## 2015-02-20 ENCOUNTER — Emergency Department (INDEPENDENT_AMBULATORY_CARE_PROVIDER_SITE_OTHER): Payer: Worker's Compensation

## 2015-02-20 ENCOUNTER — Emergency Department (INDEPENDENT_AMBULATORY_CARE_PROVIDER_SITE_OTHER)
Admission: EM | Admit: 2015-02-20 | Discharge: 2015-02-20 | Disposition: A | Discharge status: Home / Self Care | Payer: Worker's Compensation | Source: Home / Self Care | Attending: Family Medicine | Admitting: Family Medicine

## 2015-02-20 DIAGNOSIS — S39012A Strain of muscle, fascia and tendon of lower back, initial encounter: Secondary | ICD-10-CM

## 2015-02-20 MED ORDER — BACLOFEN 10 MG PO TABS: 10.0000 mg | ORAL_TABLET | Freq: Three times a day (TID) | ORAL | Status: DC

## 2015-02-20 NOTE — Discharge Instructions (Signed)
Thank you for coming in today. Come back or go to the emergency room if you notice new weakness new numbness problems walking or bowel or bladder problems. Follow up with Dr. Jillyn Hidden.    Back Exercises These exercises may help you when beginning to rehabilitate your injury. Your symptoms may resolve with or without further involvement from your physician, physical therapist or athletic trainer. While completing these exercises, remember:   Restoring tissue flexibility helps normal motion to return to the joints. This allows healthier, less painful movement and activity.  An effective stretch should be held for at least 30 seconds.  A stretch should never be painful. You should only feel a gentle lengthening or release in the stretched tissue. STRETCH - Extension, Prone on Elbows   Lie on your stomach on the floor, a bed will be too soft. Place your palms about shoulder width apart and at the height of your head.  Place your elbows under your shoulders. If this is too painful, stack pillows under your chest.  Allow your body to relax so that your hips drop lower and make contact more completely with the floor.  Hold this position for __________ seconds.  Slowly return to lying flat on the floor. Repeat __________ times. Complete this exercise __________ times per day.  RANGE OF MOTION - Extension, Prone Press Ups   Lie on your stomach on the floor, a bed will be too soft. Place your palms about shoulder width apart and at the height of your head.  Keeping your back as relaxed as possible, slowly straighten your elbows while keeping your hips on the floor. You may adjust the placement of your hands to maximize your comfort. As you gain motion, your hands will come more underneath your shoulders.  Hold this position __________ seconds.  Slowly return to lying flat on the floor. Repeat __________ times. Complete this exercise __________ times per day.  RANGE OF MOTION- Quadruped, Neutral  Spine   Assume a hands and knees position on a firm surface. Keep your hands under your shoulders and your knees under your hips. You may place padding under your knees for comfort.  Drop your head and point your tail bone toward the ground below you. This will round out your low back like an angry cat. Hold this position for __________ seconds.  Slowly lift your head and release your tail bone so that your back sags into a large arch, like an old horse.  Hold this position for __________ seconds.  Repeat this until you feel limber in your low back.  Now, find your "sweet spot." This will be the most comfortable position somewhere between the two previous positions. This is your neutral spine. Once you have found this position, tense your stomach muscles to support your low back.  Hold this position for __________ seconds. Repeat __________ times. Complete this exercise __________ times per day.  STRETCH - Flexion, Single Knee to Chest   Lie on a firm bed or floor with both legs extended in front of you.  Keeping one leg in contact with the floor, bring your opposite knee to your chest. Hold your leg in place by either grabbing behind your thigh or at your knee.  Pull until you feel a gentle stretch in your low back. Hold __________ seconds.  Slowly release your grasp and repeat the exercise with the opposite side. Repeat __________ times. Complete this exercise __________ times per day.  STRETCH - Hamstrings, Standing  Stand or sit  and extend your right / left leg, placing your foot on a chair or foot stool  Keeping a slight arch in your low back and your hips straight forward.  Lead with your chest and lean forward at the waist until you feel a gentle stretch in the back of your right / left knee or thigh. (When done correctly, this exercise requires leaning only a small distance.)  Hold this position for __________ seconds. Repeat __________ times. Complete this stretch  __________ times per day. STRENGTHENING - Deep Abdominals, Pelvic Tilt   Lie on a firm bed or floor. Keeping your legs in front of you, bend your knees so they are both pointed toward the ceiling and your feet are flat on the floor.  Tense your lower abdominal muscles to press your low back into the floor. This motion will rotate your pelvis so that your tail bone is scooping upwards rather than pointing at your feet or into the floor.  With a gentle tension and even breathing, hold this position for __________ seconds. Repeat __________ times. Complete this exercise __________ times per day.  STRENGTHENING - Abdominals, Crunches   Lie on a firm bed or floor. Keeping your legs in front of you, bend your knees so they are both pointed toward the ceiling and your feet are flat on the floor. Cross your arms over your chest.  Slightly tip your chin down without bending your neck.  Tense your abdominals and slowly lift your trunk high enough to just clear your shoulder blades. Lifting higher can put excessive stress on the low back and does not further strengthen your abdominal muscles.  Control your return to the starting position. Repeat __________ times. Complete this exercise __________ times per day.  STRENGTHENING - Quadruped, Opposite UE/LE Lift   Assume a hands and knees position on a firm surface. Keep your hands under your shoulders and your knees under your hips. You may place padding under your knees for comfort.  Find your neutral spine and gently tense your abdominal muscles so that you can maintain this position. Your shoulders and hips should form a rectangle that is parallel with the floor and is not twisted.  Keeping your trunk steady, lift your right hand no higher than your shoulder and then your left leg no higher than your hip. Make sure you are not holding your breath. Hold this position __________ seconds.  Continuing to keep your abdominal muscles tense and your back  steady, slowly return to your starting position. Repeat with the opposite arm and leg. Repeat __________ times. Complete this exercise __________ times per day. Document Released: 09/15/2005 Document Revised: 11/20/2011 Document Reviewed: 12/10/2008 [facility] Patient Information 2015 [place], [place]. This information is not intended to replace advice given to you by your health care provider. Make sure you discuss any questions you have with your health care provider.

## 2015-02-20 NOTE — ED Notes (Addendum)
Back pain and numbness in legs. Pt stepped off step ladder x 3 days ago and hurt back

## 2015-02-20 NOTE — ED Provider Notes (Signed)
Nicole Crane is a 49 y.o. female who presents to Urgent Care today for back pain. Patient was in her normal state of health at work on Wednesday June 8. She stepped down from a step ladder and fell against a shelving unit. She notes worsening low back pain. She notes pain intermittently radiating to both legs. She describes muscle spasms in her bilateral thighs and calves occurring at night since the injury. She denies any new weakness or numbness bowel bladder dysfunction difficulty walking. She's tried ibuprofen which helps a bit. She has a history of previous back pain and surgery. She had a ruptured disc and what seems to be a microdiscectomy at L5-S1. She notes she has chronic mild left leg weakness. She feels well otherwise.   History reviewed. No pertinent past medical history. History reviewed. No pertinent past surgical history. History  Substance Use Topics  . Smoking status: Not on file  . Smokeless tobacco: Not on file  . Alcohol Use: Not on file   ROS as above Medications: No current facility-administered medications for this encounter.   Current Outpatient Prescriptions  Medication Sig Dispense Refill  . baclofen (LIORESAL) 10 MG tablet Take 1 tablet (10 mg total) by mouth 3 (three) times daily. 30 each 0   Allergies  Allergen Reactions  . Antihistamines, Diphenhydramine-Type      Exam:  BP 95/60 mmHg  Pulse 83  Temp(Src) 99.1 F (37.3 C) (Oral)  Resp 16  SpO2 100% Gen: Well NAD HEENT: EOMI,  MMM Lungs: Normal work of breathing. CTABL Heart: RRR no MRG Abd: NABS, Soft. Nondistended, Nontender Exts: Brisk capillary refill, warm and well perfused.  Back: Nontender to midline. Normal back range of motion. Reflexes are intact bilateral lower extremities. Normal gait. Sensation is intact throughout.  No results found for this or any previous visit (from the past 24 hour(s)). Dg Lumbar Spine Complete  02/20/2015   CLINICAL DATA:  Initial encounter for fall off  ladder. Pain. Surgery 4 years ago for ruptured disc.  EXAM: LUMBAR SPINE - COMPLETE 4+ VIEW  COMPARISON:  MRI 05/30/2012.  Lumbar spine plain films 05/07/2012  FINDINGS: Five lumbar type vertebral bodies. Sacroiliac joints are symmetric. Maintenance of vertebral body height and alignment. Advanced degenerative disc disease at the lumbosacral junction, slightly progressive. Facet arthropathy at this level.  IMPRESSION: Progressive degenerative disc disease at the lumbosacral junction. No acute osseous abnormality.   Electronically Signed   By: Jeronimo Greaves M.D.   On: 02/20/2015 17:03    Assessment and Plan: 49 y.o. female with lumbar strain versus contusion. Patient may have some sciatica type symptoms. X-rays are relatively normal appearing with the exception of DDD at L5-S1. Discussed options. Plan for baclofen for muscle spasms. Continue ibuprofen. Follow-up with orthopedics. Discussed warning signs or symptoms. Please see discharge instructions. Patient expresses understanding.     Rodolph Bong, MD 02/20/15 [phone]

## 2015-05-27 ENCOUNTER — Other Ambulatory Visit: Payer: Self-pay | Admitting: Obstetrics and Gynecology

## 2015-05-28 LAB — CYTOLOGY - PAP

## 2016-03-27 ENCOUNTER — Ambulatory Visit: Payer: Self-pay

## 2016-03-27 ENCOUNTER — Other Ambulatory Visit: Payer: Self-pay | Admitting: Occupational Medicine

## 2016-03-27 DIAGNOSIS — M25512 Pain in left shoulder: Secondary | ICD-10-CM

## 2017-01-13 ENCOUNTER — Emergency Department (HOSPITAL_COMMUNITY)
Admission: EM | Admit: 2017-01-13 | Discharge: 2017-01-13 | Disposition: A | Discharge status: Home / Self Care | Payer: BC Managed Care – PPO | Attending: Emergency Medicine | Admitting: Emergency Medicine

## 2017-01-13 ENCOUNTER — Encounter (HOSPITAL_COMMUNITY): Payer: Self-pay | Admitting: *Deleted

## 2017-01-13 DIAGNOSIS — I10 Essential (primary) hypertension: Secondary | ICD-10-CM | POA: Diagnosis not present

## 2017-01-13 DIAGNOSIS — Y999 Unspecified external cause status: Secondary | ICD-10-CM | POA: Insufficient documentation

## 2017-01-13 DIAGNOSIS — W57XXXA Bitten or stung by nonvenomous insect and other nonvenomous arthropods, initial encounter: Secondary | ICD-10-CM | POA: Diagnosis not present

## 2017-01-13 DIAGNOSIS — E119 Type 2 diabetes mellitus without complications: Secondary | ICD-10-CM | POA: Diagnosis not present

## 2017-01-13 DIAGNOSIS — Y929 Unspecified place or not applicable: Secondary | ICD-10-CM | POA: Diagnosis not present

## 2017-01-13 DIAGNOSIS — Y939 Activity, unspecified: Secondary | ICD-10-CM | POA: Diagnosis not present

## 2017-01-13 DIAGNOSIS — S70361A Insect bite (nonvenomous), right thigh, initial encounter: Secondary | ICD-10-CM | POA: Insufficient documentation

## 2017-01-13 HISTORY — DX: Pure hypercholesterolemia, unspecified: E78.00

## 2017-01-13 HISTORY — DX: Type 2 diabetes mellitus without complications: E11.9

## 2017-01-13 HISTORY — DX: Essential (primary) hypertension: I10

## 2017-01-13 MED ORDER — DOXYCYCLINE HYCLATE 100 MG PO TABS
100.0000 mg | ORAL_TABLET | Freq: Once | ORAL | Status: AC
  Administered 2017-01-13: 100 mg via ORAL
  Filled 2017-01-13: qty 1

## 2017-01-13 MED ORDER — DOXYCYCLINE HYCLATE 100 MG PO CAPS: 100.0000 mg | ORAL_CAPSULE | Freq: Two times a day (BID) | ORAL | 0 refills | Status: DC

## 2017-01-13 MED ORDER — PREDNISONE 10 MG PO TABS: 40.0000 mg | ORAL_TABLET | Freq: Every day | ORAL | 0 refills | Status: DC

## 2017-01-13 MED ORDER — PREDNISONE 50 MG PO TABS
60.0000 mg | ORAL_TABLET | Freq: Once | ORAL | Status: AC
  Administered 2017-01-13: 60 mg via ORAL
  Filled 2017-01-13: qty 1

## 2017-01-13 NOTE — ED Triage Notes (Signed)
Pt reports pulling off a tick in her right inner thigh region. Pt reports redness, swelling and burning to the area.

## 2017-01-13 NOTE — Discharge Instructions (Signed)
Take the prednisone as directed for the next 5 days. Take the doxycycline antibiotic for the next 10 days. Return for any new or worse symptoms. Her follow-up with your doctor.

## 2017-01-13 NOTE — ED Provider Notes (Signed)
[facility]-EMERGENCY DEPT Provider Note   CSN: [id number] Arrival date & time: 01/13/17  2016     History   Chief Complaint Chief Complaint  Patient presents with  . Insect Bite    HPI Nicole Crane is a 51 y.o. female.  Patient status post tick bite to right inner thigh yesterday. Patient thinks it may have only been on for 6 hours. It was a small tic. Patient with redness and swelling to the inner thigh and itching. No trouble breathing no tongue swelling no lip swelling. No other rash.      Past Medical History:  Diagnosis Date  . Diabetes mellitus without complication (HCC)   . High cholesterol   . Hypertension     There are no active problems to display for this patient.   Past Surgical History:  Procedure Laterality Date  . ABDOMINAL HYSTERECTOMY     complete  . BACK SURGERY    . CHOLECYSTECTOMY    . ROTATOR CUFF REPAIR      OB History    No data available       Home Medications    Prior to Admission medications   Medication Sig Start Date End Date Taking? Authorizing Provider  baclofen (LIORESAL) 10 MG tablet Take 1 tablet (10 mg total) by mouth 3 (three) times daily. 02/20/15   Rodolph Bongorey, Evan S, MD  doxycycline (VIBRAMYCIN) 100 MG capsule Take 1 capsule (100 mg total) by mouth 2 (two) times daily. 01/13/17   Vanetta MuldersZackowski, Bryndan Bilyk, MD  predniSONE (DELTASONE) 10 MG tablet Take 4 tablets (40 mg total) by mouth daily. 01/13/17   Vanetta MuldersZackowski, Kaitlynd Phillips, MD    Family History History reviewed. No pertinent family history.  Social History Social History  Substance Use Topics  . Smoking status: Never Smoker  . Smokeless tobacco: Never Used  . Alcohol use No     Allergies   Antihistamines, diphenhydramine-type; Avelox [moxifloxacin hcl in nacl]; Dilaudid [hydromorphone hcl]; Levofloxacin; Morphine and related; Other; Penicillins; Sulfa antibiotics; and Victoza [liraglutide]   Review of Systems Review of Systems  Constitutional: Negative for fever.  HENT:  Negative for congestion and facial swelling.   Eyes: Negative for redness.  Respiratory: Negative for shortness of breath.   Cardiovascular: Negative for chest pain.  Gastrointestinal: Negative for abdominal pain.  Genitourinary: Negative for dysuria.  Musculoskeletal: Negative for arthralgias.  Skin: Positive for rash.  Neurological: Negative for headaches.  Hematological: Does not bruise/bleed easily.  Psychiatric/Behavioral: Negative for confusion.     Physical Exam Updated Vital Signs BP 133/68 (BP Location: Left Arm)   Pulse (!) 101   Temp 98.6 F (37 C) (Oral)   Resp 18   Ht 5\' 5"  (1.651 m)   Wt 88 kg   SpO2 100%   BMI 32.28 kg/m   Physical Exam  Constitutional: She is oriented to person, place, and time. She appears well-developed and well-nourished. No distress.  HENT:  Head: Normocephalic and atraumatic.  Mouth/Throat: Oropharynx is clear and moist.  Eyes: Conjunctivae and EOM are normal. Pupils are equal, round, and reactive to light.  Neck: Normal range of motion. Neck supple.  Cardiovascular: Normal rate, regular rhythm and normal heart sounds.   Pulmonary/Chest: Effort normal and breath sounds normal.  Abdominal: Bowel sounds are normal. There is no tenderness.  Musculoskeletal: Normal range of motion. She exhibits edema.  Right inner thigh region with large area of erythema measuring about 10 x 15 cm. Raised. Does blanch no blistering. In the center this is  what appears to be a 3 mm skin lesion suggestive of site of insect bite. No purulent discharge no induration around this area.  Neurological: She is alert and oriented to person, place, and time. No cranial nerve deficit or sensory deficit. She exhibits normal muscle tone. Coordination normal.  Skin: Skin is warm. There is erythema.  Nursing note and vitals reviewed.    ED Treatments / Results  Labs (all labs ordered are listed, but only abnormal results are displayed) Labs Reviewed - No data to  display  EKG  EKG Interpretation None       Radiology No results found.  Procedures Procedures (including critical care time)  Medications Ordered in ED Medications  doxycycline (VIBRA-TABS) tablet 100 mg (100 mg Oral Given 01/13/17 2108)  predniSONE (DELTASONE) tablet 60 mg (60 mg Oral Given 01/13/17 2108)     Initial Impression / Assessment and Plan / ED Course  I have reviewed the triage vital signs and the nursing notes.  Pertinent labs & imaging results that were available during my care of the patient were reviewed by me and considered in my medical decision making (see chart for details).     Patient status post tick bite believes it was only on for 6 hours certainly less than 24 hours. Making unlikely to be present long enough to transmit any disease [facility] spotted fever or Lyme's disease however patient is not certain of duration so will treat prophylactically with doxycycline. Otherwise the redness seems to be more consistent perhaps with a localized allergic response. Certainly no systemic allergic response. Patient has a history of allergies to Benadryl and an antihistamine blockers.  Patient states she is able to tolerate prednisone. But sometimes it gives her chest discomfort. No problem with doxycycline.  Patient nontoxic no acute distress. Will discharge home on prednisone first dose given here and first dose of doxycycline given here will continue doxycycline for the next 10 days.  Patient will return for any new or worse symptoms or follow-up with her doctor.  Final Clinical Impressions(s) / ED Diagnoses   Final diagnoses:  Tick bite, initial encounter    New Prescriptions New Prescriptions   DOXYCYCLINE (VIBRAMYCIN) 100 MG CAPSULE    Take 1 capsule (100 mg total) by mouth 2 (two) times daily.   PREDNISONE (DELTASONE) 10 MG TABLET    Take 4 tablets (40 mg total) by mouth daily.     Vanetta Mulders, MD 01/13/17 2144

## 2017-10-15 ENCOUNTER — Ambulatory Visit
Admission: RE | Admit: 2017-10-15 | Discharge: 2017-10-15 | Disposition: A | Discharge status: Home / Self Care | Payer: BC Managed Care – PPO | Source: Ambulatory Visit | Attending: Family Medicine | Admitting: Family Medicine

## 2017-10-15 ENCOUNTER — Other Ambulatory Visit: Payer: Self-pay | Admitting: Family Medicine

## 2017-10-15 DIAGNOSIS — J40 Bronchitis, not specified as acute or chronic: Secondary | ICD-10-CM

## 2017-10-31 ENCOUNTER — Other Ambulatory Visit (HOSPITAL_COMMUNITY): Payer: Self-pay | Admitting: Internal Medicine

## 2017-10-31 DIAGNOSIS — E0590 Thyrotoxicosis, unspecified without thyrotoxic crisis or storm: Secondary | ICD-10-CM

## 2017-11-12 ENCOUNTER — Encounter (HOSPITAL_COMMUNITY): Payer: BC Managed Care – PPO

## 2017-11-12 ENCOUNTER — Encounter (HOSPITAL_COMMUNITY)
Admission: RE | Admit: 2017-11-12 | Discharge: 2017-11-12 | Disposition: A | Discharge status: Home / Self Care | Payer: BC Managed Care – PPO | Source: Ambulatory Visit | Attending: Internal Medicine | Admitting: Internal Medicine

## 2017-11-12 DIAGNOSIS — E0590 Thyrotoxicosis, unspecified without thyrotoxic crisis or storm: Secondary | ICD-10-CM

## 2017-11-12 MED ORDER — SODIUM IODIDE I 131 CAPSULE
15.0000 | Freq: Once | INTRAVENOUS | Status: AC | PRN
  Administered 2017-11-12: 15 via ORAL

## 2017-11-13 ENCOUNTER — Encounter (HOSPITAL_COMMUNITY): Admission: RE | Admit: 2017-11-13 | Payer: BC Managed Care – PPO | Source: Ambulatory Visit

## 2017-11-13 ENCOUNTER — Encounter (HOSPITAL_COMMUNITY): Payer: Self-pay

## 2017-11-13 MED ORDER — SODIUM PERTECHNETATE TC 99M INJECTION
10.8000 | Freq: Once | INTRAVENOUS | Status: AC | PRN
  Administered 2017-11-13: 10.8 via INTRAVENOUS

## 2017-11-22 ENCOUNTER — Other Ambulatory Visit: Payer: Self-pay | Admitting: Internal Medicine

## 2017-11-22 ENCOUNTER — Other Ambulatory Visit: Payer: Self-pay | Admitting: Orthopedic Surgery

## 2017-11-22 DIAGNOSIS — E0520 Thyrotoxicosis with toxic multinodular goiter without thyrotoxic crisis or storm: Secondary | ICD-10-CM

## 2017-11-22 DIAGNOSIS — E0590 Thyrotoxicosis, unspecified without thyrotoxic crisis or storm: Secondary | ICD-10-CM

## 2017-11-22 DIAGNOSIS — Z96652 Presence of left artificial knee joint: Secondary | ICD-10-CM

## 2017-11-23 ENCOUNTER — Ambulatory Visit
Admission: RE | Admit: 2017-11-23 | Discharge: 2017-11-23 | Disposition: A | Discharge status: Home / Self Care | Payer: BC Managed Care – PPO | Source: Ambulatory Visit | Attending: Internal Medicine | Admitting: Internal Medicine

## 2017-11-23 DIAGNOSIS — E0590 Thyrotoxicosis, unspecified without thyrotoxic crisis or storm: Secondary | ICD-10-CM

## 2017-11-23 DIAGNOSIS — E0520 Thyrotoxicosis with toxic multinodular goiter without thyrotoxic crisis or storm: Secondary | ICD-10-CM

## 2017-11-27 ENCOUNTER — Other Ambulatory Visit: Payer: Self-pay | Admitting: Internal Medicine

## 2017-11-27 DIAGNOSIS — E0520 Thyrotoxicosis with toxic multinodular goiter without thyrotoxic crisis or storm: Secondary | ICD-10-CM

## 2017-11-27 DIAGNOSIS — E0590 Thyrotoxicosis, unspecified without thyrotoxic crisis or storm: Secondary | ICD-10-CM

## 2017-12-21 ENCOUNTER — Ambulatory Visit
Admission: RE | Admit: 2017-12-21 | Discharge: 2017-12-21 | Disposition: A | Discharge status: Home / Self Care | Payer: BC Managed Care – PPO | Source: Ambulatory Visit | Attending: Internal Medicine | Admitting: Internal Medicine

## 2017-12-21 ENCOUNTER — Other Ambulatory Visit (HOSPITAL_COMMUNITY)
Admission: RE | Admit: 2017-12-21 | Discharge: 2017-12-21 | Disposition: A | Discharge status: Home / Self Care | Payer: BC Managed Care – PPO | Source: Ambulatory Visit | Attending: Student | Admitting: Student

## 2017-12-21 DIAGNOSIS — E0520 Thyrotoxicosis with toxic multinodular goiter without thyrotoxic crisis or storm: Secondary | ICD-10-CM

## 2017-12-21 DIAGNOSIS — E0590 Thyrotoxicosis, unspecified without thyrotoxic crisis or storm: Secondary | ICD-10-CM

## 2017-12-21 DIAGNOSIS — E041 Nontoxic single thyroid nodule: Secondary | ICD-10-CM | POA: Insufficient documentation

## 2017-12-21 NOTE — Progress Notes (Signed)
Patient underwent thyroid biopsy today.  She was able to tolerate 5 passes on the right superior nodule, but could not tolerate 5 FNA passes needed to obtained sample to send for Afirma on the second right inferior nodule.   All collected sample sent to pathology.  Loyce DysKacie Matthews, MS RD PA-C 1:56 PM

## 2018-01-09 ENCOUNTER — Other Ambulatory Visit (HOSPITAL_COMMUNITY): Payer: Self-pay | Admitting: Internal Medicine

## 2018-01-09 DIAGNOSIS — E0590 Thyrotoxicosis, unspecified without thyrotoxic crisis or storm: Secondary | ICD-10-CM

## 2018-01-09 DIAGNOSIS — E0520 Thyrotoxicosis with toxic multinodular goiter without thyrotoxic crisis or storm: Secondary | ICD-10-CM

## 2018-01-11 ENCOUNTER — Ambulatory Visit: Payer: Self-pay | Admitting: Surgery

## 2018-02-14 DIAGNOSIS — W57XXXA Bitten or stung by nonvenomous insect and other nonvenomous arthropods, initial encounter: Secondary | ICD-10-CM

## 2018-02-14 HISTORY — DX: Bitten or stung by nonvenomous insect and other nonvenomous arthropods, initial encounter: W57.XXXA

## 2018-02-20 ENCOUNTER — Encounter (HOSPITAL_COMMUNITY): Payer: Self-pay

## 2018-02-20 NOTE — Pre-Procedure Instructions (Signed)
Last office visit note Dr. Tiburcio PeaHarris 11/29/2017 on chart.

## 2018-02-20 NOTE — Patient Instructions (Addendum)
Your procedure is scheduled on: Tuesday, February 26, 2018   Surgery Time:  7:30AM-9:30AM   Report to [facility] Main  Entrance    Report to admitting at 5:30 AM   Call this number if you have problems the morning of surgery [phone]   Do not eat food or drink liquids :After Midnight.   Do NOT smoke after Midnight   Take these medicines the morning of surgery with A SIP OF WATER: Methimazole   DO NOT TAKE ANY DIABETIC MEDICATIONS DAY OF YOUR SURGERY   THE DAY BEFORE SURGERY ONLY TAKE BREAKFAST AND/OR LUNCH DOSE OF GLIMEPIRIDE   BRING ASTHMA INHALER DAY OF SURGERY                               You may not have any metal on your body including hair pins, jewelry, and body piercings             Do not wear make-up, lotions, powders, perfumes/cologne, or deodorant             Do not wear nail polish.  Do not shave  48 hours prior to surgery.                Do not bring valuables to the hospital. [facility] IS NOT             RESPONSIBLE   FOR VALUABLES.   Contacts, dentures or bridgework may not be worn into surgery.   Leave suitcase in the car. After surgery it may be brought to your room.   Special Instructions: Bring a copy of your healthcare power of attorney and living will documents         the day of surgery if you haven't scanned them in before.              Please read over the following fact sheets you were given:  [facility] - Preparing for [facility] Before surgery, you can play an important role.  Because skin is not sterile, your skin needs to be as free of germs as possible.  You can reduce the number of germs on your skin by washing with CHG (chlorahexidine gluconate) soap before surgery.  CHG is an antiseptic cleaner which kills germs and bonds with the skin to continue killing germs even after washing. Please DO NOT use if you have an allergy to CHG or antibacterial soaps.  If your skin becomes reddened/irritated stop using the CHG and inform your  nurse when you arrive at Short Stay. Do not shave (including legs and underarms) for at least 48 hours prior to the first CHG shower.  You may shave your face/neck.  Please follow these instructions carefully:  1.  Shower with CHG Soap the night before surgery and the  morning of surgery.  2.  If you choose to wash your hair, wash your hair first as usual with your normal  shampoo.  3.  After you shampoo, rinse your hair and body thoroughly to remove the shampoo.                             4.  Use CHG as you would any other liquid soap.  You can apply chg directly to the skin and wash.  Gently with a scrungie or clean washcloth.  5.  Apply the CHG Soap to your  body ONLY FROM THE NECK DOWN.   Do   not use on face/ open                           Wound or open sores. Avoid contact with eyes, ears mouth and   genitals (private parts).                       Wash face,  Genitals (private parts) with your normal soap.             6.  Wash thoroughly, paying special attention to the area where your    surgery  will be performed.  7.  Thoroughly rinse your body with warm water from the neck down.  8.  DO NOT shower/wash with your normal soap after using and rinsing off the CHG Soap.                9.  Pat yourself dry with a clean towel.            10.  Wear clean pajamas.            11.  Place clean sheets on your bed the night of your first shower and do not  sleep with pets. Day of Surgery : Do not apply any lotions/deodorants the morning of surgery.  Please wear clean clothes to the hospital/surgery center.  FAILURE TO FOLLOW THESE INSTRUCTIONS MAY RESULT IN THE CANCELLATION OF YOUR SURGERY  PATIENT SIGNATURE_________________________________  NURSE SIGNATURE__________________________________  ________________________________________________________________________

## 2018-02-22 ENCOUNTER — Encounter (HOSPITAL_COMMUNITY)
Admission: RE | Admit: 2018-02-22 | Discharge: 2018-02-22 | Disposition: A | Discharge status: Home / Self Care | Payer: BC Managed Care – PPO | Source: Ambulatory Visit | Attending: Surgery | Admitting: Surgery

## 2018-02-22 ENCOUNTER — Ambulatory Visit (HOSPITAL_COMMUNITY)
Admission: RE | Admit: 2018-02-22 | Discharge: 2018-02-22 | Disposition: A | Discharge status: Home / Self Care | Payer: BC Managed Care – PPO | Source: Ambulatory Visit | Attending: Anesthesiology | Admitting: Anesthesiology

## 2018-02-22 ENCOUNTER — Encounter (HOSPITAL_COMMUNITY): Payer: Self-pay

## 2018-02-22 ENCOUNTER — Other Ambulatory Visit: Payer: Self-pay

## 2018-02-22 DIAGNOSIS — Z01810 Encounter for preprocedural cardiovascular examination: Secondary | ICD-10-CM | POA: Insufficient documentation

## 2018-02-22 DIAGNOSIS — Z01818 Encounter for other preprocedural examination: Secondary | ICD-10-CM

## 2018-02-22 DIAGNOSIS — Z01812 Encounter for preprocedural laboratory examination: Secondary | ICD-10-CM | POA: Insufficient documentation

## 2018-02-22 HISTORY — DX: Localized edema: R60.0

## 2018-02-22 HISTORY — DX: Anxiety disorder, unspecified: F41.9

## 2018-02-22 HISTORY — DX: Gastro-esophageal reflux disease without esophagitis: K21.9

## 2018-02-22 HISTORY — DX: Thyrotoxicosis, unspecified without thyrotoxic crisis or storm: E05.90

## 2018-02-22 HISTORY — DX: Nontoxic multinodular goiter: E04.2

## 2018-02-22 HISTORY — DX: Other intervertebral disc degeneration, lumbosacral region: M51.37

## 2018-02-22 HISTORY — DX: Other intervertebral disc degeneration, lumbosacral region without mention of lumbar back pain or lower extremity pain: M51.379

## 2018-02-22 HISTORY — DX: Bitten or stung by nonvenomous insect and other nonvenomous arthropods, initial encounter: W57.XXXA

## 2018-02-22 HISTORY — DX: Allergic rhinitis, unspecified: J30.9

## 2018-02-22 HISTORY — DX: Personal history of other venous thrombosis and embolism: Z86.718

## 2018-02-22 HISTORY — DX: Primary osteoarthritis, unspecified shoulder: M19.019

## 2018-02-22 HISTORY — DX: Spinal stenosis, site unspecified: M48.00

## 2018-02-22 LAB — BASIC METABOLIC PANEL
Anion gap: 8 (ref 5–15)
BUN: 20 mg/dL (ref 6–20)
CO2: 28 mmol/L (ref 22–32)
Calcium: 9.6 mg/dL (ref 8.9–10.3)
Chloride: 100 mmol/L — ABNORMAL LOW (ref 101–111)
Creatinine, Ser: 0.99 mg/dL (ref 0.44–1.00)
GFR calc Af Amer: >60 mL/min (ref >60)
GFR calc non Af Amer: >60 mL/min (ref >60)
Glucose, Bld: 242 mg/dL — ABNORMAL HIGH (ref 65–99)
Potassium: 4.2 mmol/L (ref 3.5–5.1)
Sodium: 136 mmol/L (ref 135–145)

## 2018-02-22 LAB — CBC
HCT: 36.3 % (ref 36.0–46.0)
Hemoglobin: 12.2 g/dL (ref 12.0–15.0)
MCH: 28.9 pg (ref 26.0–34.0)
MCHC: 33.6 g/dL (ref 30.0–36.0)
MCV: 86 fL (ref 78.0–100.0)
Platelets: 296 10*3/uL (ref 150–400)
RBC: 4.22 MIL/uL (ref 3.87–5.11)
RDW: 12.7 % (ref 11.5–15.5)
WBC: 9.2 10*3/uL (ref 4.0–10.5)

## 2018-02-22 LAB — HEMOGLOBIN A1C
Hgb A1c MFr Bld: 7.9 % — ABNORMAL HIGH (ref 4.8–5.6)
Mean Plasma Glucose: 180.03 mg/dL

## 2018-02-22 LAB — GLUCOSE, CAPILLARY: Glucose-Capillary: 254 mg/dL — ABNORMAL HIGH (ref 65–99)

## 2018-02-22 NOTE — Pre-Procedure Instructions (Signed)
Armen  at [facility] office made aware that Ms. Dulac started on February 14, 2018 taking Doxycycline 100mg  PO BID for 7 days due to a Long star tick bite.  Armen stated she would make Dr. Gerrit FriendsGerkin aware.

## 2018-02-22 NOTE — Pre-Procedure Instructions (Signed)
Bmet result 02/22/2018 from PAT visit faxed to Dr. Gerrit FriendsGerkin

## 2018-02-22 NOTE — Pre-Procedure Instructions (Signed)
EKG and Hgb A1C 02/22/2018 results still pending, left chart with Jasmine DecemberSharon, RN to follow-up.

## 2018-02-25 ENCOUNTER — Encounter (HOSPITAL_COMMUNITY): Payer: Self-pay | Admitting: Surgery

## 2018-02-25 ENCOUNTER — Ambulatory Visit (HOSPITAL_COMMUNITY): Payer: BC Managed Care – PPO

## 2018-02-25 DIAGNOSIS — E042 Nontoxic multinodular goiter: Secondary | ICD-10-CM | POA: Diagnosis present

## 2018-02-25 DIAGNOSIS — E0590 Thyrotoxicosis, unspecified without thyrotoxic crisis or storm: Secondary | ICD-10-CM | POA: Diagnosis present

## 2018-02-25 NOTE — H&P (Signed)
General Surgery [facility]  Halford DecampBeverly A Antonio DOB: 12/29/1965 Married / Language: English / Race: White Female   History of Present Illness  The patient is a 52 year old female who presents with a thyroid nodule.  CHIEF COMPLAINT: Hyperthyroidism, multiple thyroid nodules  Patient is referred by Dr. Carmon SailsMadhavi Avernini and Dr. Johny BlamerWilliam Harris for surgical evaluation and management of multiple thyroid nodules with hyperthyroidism. Patient had been relatively asymptomatic when she was noted on routine laboratory studies to have a markedly suppressed TSH level. Patient had noted fatigue. She had been having palpitations at night. She did have a mild tremor in the hand. Laboratory studies showed TSH level of 0.01. Patient has been started on methimazole by her endocrinologist. She underwent a nuclear medicine scan on November 13, 2017. This showed asymmetric activity in the right thyroid lobe greater than the left. Subsequent ultrasound examination was performed showing an enlarged right thyroid lobe measuring 6.5 cm. It contained a dominant nodule measuring 2.6 cm and a second inferior nodule measuring 1.8 cm. There was a 1.1 cm nodule present in the left thyroid lobe. Patient subsequently underwent fine-needle aspiration of the 2 nodules in the right thyroid lobe. This demonstrated benign cytopathology. Patient is now referred for consideration for thyroidectomy for management of multiple thyroid nodules and hyperthyroidism. Patient has also discussed radioactive iodine treatment with her endocrinologist as an alternative to surgery. Patient has had no prior head or neck surgery. She has never been on thyroid medication until this time when she started on anti-thyroid medication. 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   Past Surgical History Cesarean Section - Multiple  Gallbladder Surgery - Laparoscopic  Hysterectomy (not due to cancer) - Complete   Oral Surgery  Shoulder Surgery  Left.  Diagnostic Studies History Colonoscopy  never Mammogram  within last year Pap Smear  1-5 years ago  Allergies Penicillins  Morphine Derivatives  Lyrica *ANTICONVULSANTS*  Codeine/Codeine Derivatives  Ethanol *NUTRIENTS*  Simvastatin *ANTIHYPERLIPIDEMICS*  Naproxen Sodium *ANALGESICS - ANTI-INFLAMMATORY*  Oseltamivir Phosphate *ANTIVIRALS*  Liraglutide *ANTIDIABETICS*  Erythromycin *DERMATOLOGICALS*  LevoFLOXacin *CHEMICALS*  Pravastatin Sodium *ANTIHYPERLIPIDEMICS*  Moxifloxacin HCl *CHEMICALS*  Diphenydramine-PSE-APAP *COUGH/COLD/ALLERGY*  Amoxicillin *PENICILLINS*  Allergies Reconciled   Medication History MethIMAzole (10MG  Tablet, Oral) Active. Atorvastatin Calcium (40MG  Tablet, Oral) Active. ALPRAZolam (0.5MG  Tablet, Oral) Active. HydroCHLOROthiazide (25MG  Tablet, Oral) Active. Lisinopril (20MG  Tablet, Oral) Active. Pantoprazole Sodium (40MG  Tablet DR, Oral) Active. Hydrocodone-Homatropine (5-1.5MG /5ML Syrup, Oral) Active. Janumet XR (50-1000MG  Tablet ER 24HR, Oral) Active. Glimepiride (4MG  Tablet, Oral) Active. IBU (800MG  Tablet, Oral) Active. Doxycycline Hyclate (100MG  Tablet, Oral) Active. Flonase (50MCG/ACT Suspension, Nasal) Active. Medications Reconciled  Social History Caffeine use  Coffee. No alcohol use  No drug use  Tobacco use  Never smoker.  Family History Breast Cancer  Mother. Cancer  Father, Mother. Diabetes Mellitus  Brother. Heart Disease  Brother. Heart disease in female family member before age 53  Heart disease in female family member before age 53  Hypertension  Brother, Son. Respiratory Condition  Father, Mother.  Pregnancy / Birth History  Age at menarche  8 years. Age of menopause  <44 Contraceptive History  Oral contraceptives. Gravida  4 Length (months) of breastfeeding  7-12 Maternal age  53-30 Para  3  Other Problems Diabetes  Mellitus  Gastroesophageal Reflux Disease  High blood pressure  Oophorectomy  Bilateral. Thyroid Disease   Review of Systems General Present- Fatigue and Night Sweats. Not Present- Appetite Loss, Chills, Fever, Weight Gain and Weight Loss. HEENT  Present- Hoarseness and Sore Throat. Not Present- Earache, Hearing Loss, Nose Bleed, Oral Ulcers, Ringing in the Ears, Seasonal Allergies, Sinus Pain, Visual Disturbances, Wears glasses/contact lenses and Yellow Eyes. Respiratory Not Present- Bloody sputum, Chronic Cough, Difficulty Breathing, Snoring and Wheezing. Breast Not Present- Breast Mass, Breast Pain, Nipple Discharge and Skin Changes. Cardiovascular Present- Leg Cramps, Palpitations and Rapid Heart Rate. Not Present- Chest Pain, Difficulty Breathing Lying Down, Shortness of Breath and Swelling of Extremities. Gastrointestinal Present- Difficulty Swallowing. Not Present- Abdominal Pain, Bloating, Bloody Stool, Change in Bowel Habits, Chronic diarrhea, Constipation, Excessive gas, Gets full quickly at meals, Hemorrhoids, Indigestion, Nausea, Rectal Pain and Vomiting. Female Genitourinary Not Present- Frequency, Nocturia, Painful Urination, Pelvic Pain and Urgency. Musculoskeletal Not Present- Back Pain, Joint Pain, Joint Stiffness, Muscle Pain, Muscle Weakness and Swelling of Extremities. Neurological Not Present- Decreased Memory, Fainting, Headaches, Numbness, Seizures, Tingling, Tremor, Trouble walking and Weakness. Psychiatric Not Present- Anxiety, Bipolar, Change in Sleep Pattern, Depression, Fearful and Frequent crying. Endocrine Not Present- Cold Intolerance, Excessive Hunger, Hair Changes, Heat Intolerance, Hot flashes and New Diabetes. Hematology Not Present- Blood Thinners, Easy Bruising, Excessive bleeding, Gland problems, HIV and Persistent Infections.  Vitals Weight: 208.13 lb Height: 65in Body Surface Area: 2.01 m Body Mass Index: 34.63 kg/m  Temp.: 98.43F(Oral)   Pulse: 111 (Regular)  BP: 112/66 (Sitting, Right Arm, Standard)  Physical Exam   See vital signs recorded above  GENERAL APPEARANCE Development: normal Nutritional status: normal Gross deformities: none  SKIN Rash, lesions, ulcers: none Induration, erythema: none Nodules: none palpable  EYES Conjunctiva and lids: normal Pupils: equal and reactive Iris: normal bilaterally  EARS, NOSE, MOUTH, THROAT External ears: no lesion or deformity External nose: no lesion or deformity Hearing: grossly normal Lips: no lesion or deformity Dentition: normal for age Oral mucosa: moist  NECK Symmetric: yes Trachea: midline Thyroid: no palpable nodules in the thyroid bed; no dominant masses; right lobe does appear to be slightly larger than the left on examination.  CHEST Respiratory effort: normal Retraction or accessory muscle use: no Breath sounds: normal bilaterally Rales, rhonchi, wheeze: none  CARDIOVASCULAR Auscultation: regular rhythm, normal rate Murmurs: none Pulses: carotid and radial pulse 2+ palpable Lower extremity edema: none Lower extremity varicosities: none  MUSCULOSKELETAL Station and gait: normal Digits and nails: no clubbing or cyanosis Muscle strength: grossly normal all extremities Range of motion: grossly normal all extremities Deformity: none  LYMPHATIC Cervical: none palpable Supraclavicular: none palpable  PSYCHIATRIC Oriented to person, place, and time: yes Mood and affect: normal for situation Judgment and insight: appropriate for situation    Assessment & Plan  HYPERTHYROIDISM (E05.90) MULTIPLE THYROID NODULES (E04.2)  The patient is referred by her endocrinologist and primary care physician for evaluation of multiple thyroid nodules and hyperthyroidism. She is provided with written literature on thyroid surgery to review at home.  Patient has bilateral thyroid nodules. There is enlargement of the right thyroid lobe. Laboratory  studies show a markedly suppressed TSH level. Patient has been started on methimazole.  Patient has discussed the options for management with her endocrinologist. These include radioactive iodine ablation versus having thyroid surgery for removal of the thyroid gland. Today we discussed total thyroidectomy in detail. We discussed the location of the surgical incision. We discussed potential complications including recurrent laryngeal nerve injury and injury to parathyroid glands. We discussed the hospital stay to be anticipated. We discussed time out of work. We discussed the need for lifelong thyroid hormone replacement.  Patient understands the options for management. Patient  prefers to proceed with thyroidectomy. She does not want to continue to have ultrasound examinations or the possibility of fine-needle aspiration biopsy or surgery in the future.  The risks and benefits of the procedure have been discussed at length with the patient. The patient understands the proposed procedure, potential alternative treatments, and the course of recovery to be expected. All of the patient's questions have been answered at this time. The patient wishes to proceed with surgery.   Darnell Level, MD [facility] Office: [phone]

## 2018-02-26 ENCOUNTER — Ambulatory Visit (HOSPITAL_COMMUNITY): Payer: BC Managed Care – PPO | Admitting: Anesthesiology

## 2018-02-26 ENCOUNTER — Encounter (HOSPITAL_COMMUNITY): Payer: Self-pay | Admitting: Emergency Medicine

## 2018-02-26 ENCOUNTER — Encounter (HOSPITAL_COMMUNITY)
Admission: RE | Disposition: A | Discharge status: Home / Self Care | Payer: Self-pay | Source: Ambulatory Visit | Attending: Surgery

## 2018-02-26 ENCOUNTER — Observation Stay (HOSPITAL_COMMUNITY)
Admission: RE | Admit: 2018-02-26 | Discharge: 2018-02-27 | Disposition: A | Discharge status: Home / Self Care | Payer: BC Managed Care – PPO | Source: Ambulatory Visit | Attending: Surgery | Admitting: Surgery

## 2018-02-26 ENCOUNTER — Other Ambulatory Visit: Payer: Self-pay

## 2018-02-26 DIAGNOSIS — E0520 Thyrotoxicosis with toxic multinodular goiter without thyrotoxic crisis or storm: Principal | ICD-10-CM | POA: Insufficient documentation

## 2018-02-26 DIAGNOSIS — K219 Gastro-esophageal reflux disease without esophagitis: Secondary | ICD-10-CM | POA: Diagnosis not present

## 2018-02-26 DIAGNOSIS — E119 Type 2 diabetes mellitus without complications: Secondary | ICD-10-CM | POA: Insufficient documentation

## 2018-02-26 DIAGNOSIS — Z79899 Other long term (current) drug therapy: Secondary | ICD-10-CM | POA: Diagnosis not present

## 2018-02-26 DIAGNOSIS — Z7951 Long term (current) use of inhaled steroids: Secondary | ICD-10-CM | POA: Diagnosis not present

## 2018-02-26 DIAGNOSIS — I10 Essential (primary) hypertension: Secondary | ICD-10-CM | POA: Insufficient documentation

## 2018-02-26 DIAGNOSIS — Z7984 Long term (current) use of oral hypoglycemic drugs: Secondary | ICD-10-CM | POA: Diagnosis not present

## 2018-02-26 DIAGNOSIS — E0590 Thyrotoxicosis, unspecified without thyrotoxic crisis or storm: Secondary | ICD-10-CM | POA: Diagnosis present

## 2018-02-26 DIAGNOSIS — E042 Nontoxic multinodular goiter: Secondary | ICD-10-CM | POA: Diagnosis present

## 2018-02-26 HISTORY — PX: THYROIDECTOMY: SHX17

## 2018-02-26 LAB — GLUCOSE, CAPILLARY
Glucose-Capillary: 156 mg/dL — ABNORMAL HIGH (ref 65–99)
Glucose-Capillary: 232 mg/dL — ABNORMAL HIGH (ref 65–99)
Glucose-Capillary: 283 mg/dL — ABNORMAL HIGH (ref 65–99)
Glucose-Capillary: 206 mg/dL — ABNORMAL HIGH (ref 65–99)
Glucose-Capillary: 182 mg/dL — ABNORMAL HIGH (ref 65–99)

## 2018-02-26 SURGERY — THYROIDECTOMY
Anesthesia: General

## 2018-02-26 MED ORDER — ONDANSETRON HCL 4 MG/2ML IJ SOLN
INTRAMUSCULAR | Status: DC | PRN
  Administered 2018-02-26: 4 mg via INTRAVENOUS

## 2018-02-26 MED ORDER — MIDAZOLAM HCL 2 MG/2ML IJ SOLN
INTRAMUSCULAR | Status: AC
  Filled 2018-02-26: qty 2

## 2018-02-26 MED ORDER — KCL IN DEXTROSE-NACL 20-5-0.45 MEQ/L-%-% IV SOLN
INTRAVENOUS | Status: DC
  Administered 2018-02-26: 21:00:00 via INTRAVENOUS
  Filled 2018-02-26: qty 1000

## 2018-02-26 MED ORDER — PHENYLEPHRINE 40 MCG/ML (10ML) SYRINGE FOR IV PUSH (FOR BLOOD PRESSURE SUPPORT)
PREFILLED_SYRINGE | INTRAVENOUS | Status: DC | PRN
  Administered 2018-02-26 (×2): 80 ug via INTRAVENOUS

## 2018-02-26 MED ORDER — LINAGLIPTIN 5 MG PO TABS
5.0000 mg | ORAL_TABLET | Freq: Every day | ORAL | Status: DC
  Filled 2018-02-26: qty 1

## 2018-02-26 MED ORDER — PANTOPRAZOLE SODIUM 40 MG PO TBEC
40.0000 mg | DELAYED_RELEASE_TABLET | Freq: Every day | ORAL | Status: DC
  Administered 2018-02-26: 40 mg via ORAL
  Filled 2018-02-26: qty 1

## 2018-02-26 MED ORDER — TRAMADOL HCL 50 MG PO TABS
50.0000 mg | ORAL_TABLET | Freq: Four times a day (QID) | ORAL | Status: DC | PRN
  Administered 2018-02-26 – 2018-02-27 (×3): 50 mg via ORAL
  Filled 2018-02-26 (×3): qty 1

## 2018-02-26 MED ORDER — FENTANYL CITRATE (PF) 100 MCG/2ML IJ SOLN
INTRAMUSCULAR | Status: DC | PRN
  Administered 2018-02-26 (×2): 25 ug via INTRAVENOUS
  Administered 2018-02-26 (×2): 50 ug via INTRAVENOUS
  Administered 2018-02-26: 25 ug via INTRAVENOUS

## 2018-02-26 MED ORDER — FENTANYL CITRATE (PF) 100 MCG/2ML IJ SOLN
INTRAMUSCULAR | Status: AC
  Administered 2018-02-26: 25 ug via INTRAVENOUS
  Filled 2018-02-26: qty 2

## 2018-02-26 MED ORDER — SITAGLIPTIN PHOS-METFORMIN HCL 50-1000 MG PO TABS: 1.0000 | ORAL_TABLET | Freq: Two times a day (BID) | ORAL | Status: DC

## 2018-02-26 MED ORDER — ALPRAZOLAM 0.5 MG PO TABS
0.5000 mg | ORAL_TABLET | Freq: Every day | ORAL | Status: DC
  Administered 2018-02-26: 0.5 mg via ORAL
  Filled 2018-02-26: qty 1

## 2018-02-26 MED ORDER — FENTANYL CITRATE (PF) 100 MCG/2ML IJ SOLN: 25.0000 ug | INTRAMUSCULAR | Status: DC | PRN

## 2018-02-26 MED ORDER — 0.9 % SODIUM CHLORIDE (POUR BTL) OPTIME
TOPICAL | Status: DC | PRN
  Administered 2018-02-26: 1000 mL

## 2018-02-26 MED ORDER — PHENYLEPHRINE 40 MCG/ML (10ML) SYRINGE FOR IV PUSH (FOR BLOOD PRESSURE SUPPORT)
PREFILLED_SYRINGE | INTRAVENOUS | Status: AC
  Filled 2018-02-26: qty 10

## 2018-02-26 MED ORDER — VANCOMYCIN HCL IN DEXTROSE 1-5 GM/200ML-% IV SOLN
INTRAVENOUS | Status: AC
  Filled 2018-02-26: qty 200

## 2018-02-26 MED ORDER — PROPOFOL 10 MG/ML IV BOLUS
INTRAVENOUS | Status: DC | PRN
  Administered 2018-02-26: 150 mg via INTRAVENOUS

## 2018-02-26 MED ORDER — LISINOPRIL 20 MG PO TABS
20.0000 mg | ORAL_TABLET | Freq: Every day | ORAL | Status: DC
  Administered 2018-02-26 – 2018-02-27 (×2): 20 mg via ORAL
  Filled 2018-02-26 (×2): qty 1

## 2018-02-26 MED ORDER — PROPOFOL 10 MG/ML IV BOLUS
INTRAVENOUS | Status: AC
  Filled 2018-02-26: qty 20

## 2018-02-26 MED ORDER — INSULIN ASPART 100 UNIT/ML ~~LOC~~ SOLN
3.0000 [IU] | Freq: Once | SUBCUTANEOUS | Status: AC
  Administered 2018-02-26: 3 [IU] via SUBCUTANEOUS

## 2018-02-26 MED ORDER — HYDROCHLOROTHIAZIDE 25 MG PO TABS
25.0000 mg | ORAL_TABLET | Freq: Every morning | ORAL | Status: DC
  Administered 2018-02-26: 25 mg via ORAL
  Filled 2018-02-26 (×2): qty 1

## 2018-02-26 MED ORDER — ONDANSETRON 4 MG PO TBDP: 4.0000 mg | ORAL_TABLET | Freq: Four times a day (QID) | ORAL | Status: DC | PRN

## 2018-02-26 MED ORDER — FENTANYL CITRATE (PF) 100 MCG/2ML IJ SOLN
25.0000 ug | INTRAMUSCULAR | Status: DC | PRN
  Administered 2018-02-26: 25 ug via INTRAVENOUS
  Administered 2018-02-26: 50 ug via INTRAVENOUS
  Administered 2018-02-26: 25 ug via INTRAVENOUS
  Administered 2018-02-26: 50 ug via INTRAVENOUS

## 2018-02-26 MED ORDER — ROCURONIUM BROMIDE 10 MG/ML (PF) SYRINGE
PREFILLED_SYRINGE | INTRAVENOUS | Status: DC | PRN
  Administered 2018-02-26: 50 mg via INTRAVENOUS

## 2018-02-26 MED ORDER — INSULIN ASPART 100 UNIT/ML ~~LOC~~ SOLN
SUBCUTANEOUS | Status: AC
  Administered 2018-02-26: 3 [IU] via SUBCUTANEOUS
  Filled 2018-02-26: qty 1

## 2018-02-26 MED ORDER — FENTANYL CITRATE (PF) 250 MCG/5ML IJ SOLN
INTRAMUSCULAR | Status: AC
  Filled 2018-02-26: qty 5

## 2018-02-26 MED ORDER — CHLORHEXIDINE GLUCONATE CLOTH 2 % EX PADS: 6.0000 | MEDICATED_PAD | Freq: Once | CUTANEOUS | Status: DC

## 2018-02-26 MED ORDER — ONDANSETRON HCL 4 MG/2ML IJ SOLN
4.0000 mg | Freq: Four times a day (QID) | INTRAMUSCULAR | Status: DC | PRN
  Administered 2018-02-26: 4 mg via INTRAVENOUS
  Filled 2018-02-26: qty 2

## 2018-02-26 MED ORDER — LIDOCAINE 2% (20 MG/ML) 5 ML SYRINGE
INTRAMUSCULAR | Status: DC | PRN
  Administered 2018-02-26: 80 mg via INTRAVENOUS

## 2018-02-26 MED ORDER — HYDROCODONE-ACETAMINOPHEN 5-325 MG PO TABS
1.0000 | ORAL_TABLET | ORAL | Status: DC | PRN
  Filled 2018-02-26: qty 2

## 2018-02-26 MED ORDER — LACTATED RINGERS IV SOLN
INTRAVENOUS | Status: DC
  Administered 2018-02-26: 09:00:00 via INTRAVENOUS

## 2018-02-26 MED ORDER — LACTATED RINGERS IV SOLN
INTRAVENOUS | Status: DC | PRN
  Administered 2018-02-26: 07:00:00 via INTRAVENOUS

## 2018-02-26 MED ORDER — LIDOCAINE 2% (20 MG/ML) 5 ML SYRINGE
INTRAMUSCULAR | Status: AC
  Filled 2018-02-26: qty 5

## 2018-02-26 MED ORDER — SUGAMMADEX SODIUM 200 MG/2ML IV SOLN
INTRAVENOUS | Status: DC | PRN
Start: 2018-02-26 — End: 2018-02-26
  Administered 2018-02-26: 200 mg via INTRAVENOUS

## 2018-02-26 MED ORDER — ROCURONIUM BROMIDE 100 MG/10ML IV SOLN
INTRAVENOUS | Status: AC
Start: 2018-02-26 — End: ?
  Filled 2018-02-26: qty 1

## 2018-02-26 MED ORDER — VANCOMYCIN HCL IN DEXTROSE 1-5 GM/200ML-% IV SOLN
1000.0000 mg | INTRAVENOUS | Status: AC
  Administered 2018-02-26: 1000 mg via INTRAVENOUS

## 2018-02-26 MED ORDER — ACETAMINOPHEN 325 MG PO TABS
650.0000 mg | ORAL_TABLET | Freq: Four times a day (QID) | ORAL | Status: DC | PRN
  Administered 2018-02-26 – 2018-02-27 (×3): 650 mg via ORAL
  Filled 2018-02-26 (×3): qty 2

## 2018-02-26 MED ORDER — CALCIUM CARBONATE 1250 (500 CA) MG PO TABS
2.0000 | ORAL_TABLET | Freq: Three times a day (TID) | ORAL | Status: DC
  Administered 2018-02-26 – 2018-02-27 (×3): 1000 mg via ORAL
  Filled 2018-02-26 (×5): qty 1

## 2018-02-26 MED ORDER — MIDAZOLAM HCL 5 MG/5ML IJ SOLN
INTRAMUSCULAR | Status: DC | PRN
  Administered 2018-02-26: 2 mg via INTRAVENOUS

## 2018-02-26 MED ORDER — ONDANSETRON HCL 4 MG/2ML IJ SOLN: 4.0000 mg | Freq: Once | INTRAMUSCULAR | Status: DC | PRN

## 2018-02-26 MED ORDER — ALBUTEROL SULFATE (2.5 MG/3ML) 0.083% IN NEBU: 2.5000 mg | INHALATION_SOLUTION | Freq: Four times a day (QID) | RESPIRATORY_TRACT | Status: DC | PRN

## 2018-02-26 MED ORDER — ONDANSETRON HCL 4 MG/2ML IJ SOLN
INTRAMUSCULAR | Status: AC
  Filled 2018-02-26: qty 2

## 2018-02-26 MED ORDER — METFORMIN HCL 500 MG PO TABS
1000.0000 mg | ORAL_TABLET | Freq: Two times a day (BID) | ORAL | Status: DC
  Filled 2018-02-26 (×2): qty 2

## 2018-02-26 MED ORDER — FENTANYL CITRATE (PF) 100 MCG/2ML IJ SOLN
INTRAMUSCULAR | Status: AC
Start: 2018-02-26 — End: 2018-02-26
  Filled 2018-02-26: qty 2

## 2018-02-26 MED ORDER — INSULIN ASPART 100 UNIT/ML ~~LOC~~ SOLN
SUBCUTANEOUS | Status: AC
  Filled 2018-02-26: qty 1

## 2018-02-26 MED ORDER — ACETAMINOPHEN 650 MG RE SUPP: 650.0000 mg | Freq: Four times a day (QID) | RECTAL | Status: DC | PRN

## 2018-02-26 MED ORDER — GLIMEPIRIDE 4 MG PO TABS
4.0000 mg | ORAL_TABLET | Freq: Two times a day (BID) | ORAL | Status: DC
  Administered 2018-02-26 – 2018-02-27 (×2): 4 mg via ORAL
  Filled 2018-02-26 (×3): qty 1

## 2018-02-26 SURGICAL SUPPLY — 42 items
ATTRACTOMAT 16X20 MAGNETIC DRP (DRAPES) ×2 IMPLANT
BLADE SURG 15 STRL LF DISP TIS (BLADE) ×1 IMPLANT
BLADE SURG 15 STRL SS (BLADE) ×1
CHLORAPREP W/TINT 26ML (MISCELLANEOUS) ×4 IMPLANT
CLIP VESOCCLUDE MED 6/CT (CLIP) ×4 IMPLANT
CLIP VESOCCLUDE SM WIDE 6/CT (CLIP) ×6 IMPLANT
COVER SURGICAL LIGHT HANDLE (MISCELLANEOUS) ×2 IMPLANT
DISSECTOR ROUND CHERRY 3/8 STR (MISCELLANEOUS) IMPLANT
DRAPE LAPAROTOMY T 98X78 PEDS (DRAPES) ×2 IMPLANT
ELECT PENCIL ROCKER SW 15FT (MISCELLANEOUS) ×2 IMPLANT
ELECT REM PT RETURN 15FT ADLT (MISCELLANEOUS) ×2 IMPLANT
GAUZE 4X4 16PLY RFD (DISPOSABLE) ×2 IMPLANT
GAUZE SPONGE 4X4 12PLY STRL (GAUZE/BANDAGES/DRESSINGS) ×2 IMPLANT
GLOVE BIOGEL PI IND STRL 6.5 (GLOVE) ×1 IMPLANT
GLOVE BIOGEL PI IND STRL 7.0 (GLOVE) ×2 IMPLANT
GLOVE BIOGEL PI INDICATOR 6.5 (GLOVE) ×1
GLOVE BIOGEL PI INDICATOR 7.0 (GLOVE) ×2
GLOVE ECLIPSE 7.0 STRL STRAW (GLOVE) ×2 IMPLANT
GLOVE SURG ORTHO 8.0 STRL STRW (GLOVE) ×2 IMPLANT
GOWN STRL REUS W/ TWL LRG LVL3 (GOWN DISPOSABLE) ×2 IMPLANT
GOWN STRL REUS W/TWL LRG LVL3 (GOWN DISPOSABLE) ×2
GOWN STRL REUS W/TWL XL LVL3 (GOWN DISPOSABLE) ×2 IMPLANT
HEMOSTAT SURGICEL 2X4 FIBR (HEMOSTASIS) IMPLANT
ILLUMINATOR WAVEGUIDE N/F (MISCELLANEOUS) ×2 IMPLANT
KIT BASIN OR (CUSTOM PROCEDURE TRAY) ×2 IMPLANT
LIGHT WAVEGUIDE WIDE FLAT (MISCELLANEOUS) IMPLANT
PACK BASIC VI WITH GOWN DISP (CUSTOM PROCEDURE TRAY) ×2 IMPLANT
POWDER SURGICEL 3.0 GRAM (HEMOSTASIS) ×2 IMPLANT
SHEARS HARMONIC 9CM CVD (BLADE) ×2 IMPLANT
STAPLER VISISTAT 35W (STAPLE) IMPLANT
STRIP CLOSURE SKIN 1/2X4 (GAUZE/BANDAGES/DRESSINGS) ×2 IMPLANT
SUT MNCRL AB 4-0 PS2 18 (SUTURE) ×2 IMPLANT
SUT SILK 2 0 (SUTURE)
SUT SILK 2-0 18XBRD TIE 12 (SUTURE) IMPLANT
SUT SILK 3 0 (SUTURE)
SUT SILK 3-0 18XBRD TIE 12 (SUTURE) IMPLANT
SUT VIC AB 3-0 SH 18 (SUTURE) ×4 IMPLANT
SYR BULB IRRIGATION 50ML (SYRINGE) ×2 IMPLANT
TAPE PAPER 3X10 WHT MICROPORE (GAUZE/BANDAGES/DRESSINGS) ×2 IMPLANT
TOWEL OR 17X26 10 PK STRL BLUE (TOWEL DISPOSABLE) ×2 IMPLANT
TOWEL OR NON WOVEN STRL DISP B (DISPOSABLE) ×2 IMPLANT
YANKAUER SUCT BULB TIP 10FT TU (MISCELLANEOUS) ×2 IMPLANT

## 2018-02-26 NOTE — Transfer of Care (Signed)
Immediate Anesthesia Transfer of Care Note  Patient: Nicole HillockBeverly G Crane  Procedure(s) Performed: TOTAL THYROIDECTOMY (N/A )  Patient Location: PACU  Anesthesia Type:General  Level of Consciousness: awake, alert , oriented and patient cooperative  Airway & Oxygen Therapy: Patient Spontanous Breathing and Patient connected to face mask oxygen  Post-op Assessment: Report given to RN, Post -op Vital signs reviewed and stable and Patient moving all extremities  Post vital signs: Reviewed and stable  Last Vitals:  Vitals Value Taken Time  BP 148/67 02/26/2018  9:05 AM  Temp    Pulse 94 02/26/2018  9:07 AM  Resp 17 02/26/2018  9:07 AM  SpO2 100 % 02/26/2018  9:07 AM  Vitals shown include unvalidated device data.  Last Pain:  Vitals:   02/26/18 0635  TempSrc:   PainSc: 2       Patients Stated Pain Goal: 4 (02/26/18 [id number])  Complications: No apparent anesthesia complications

## 2018-02-26 NOTE — Anesthesia Postprocedure Evaluation (Signed)
Anesthesia Post Note  Patient: Nicole BradleyBeverly G Crane  Procedure(s) Performed: TOTAL THYROIDECTOMY (N/A )     Patient location during evaluation: PACU Anesthesia Type: General Level of consciousness: awake and alert Pain management: pain level controlled Vital Signs Assessment: post-procedure vital signs reviewed and stable Respiratory status: spontaneous breathing, nonlabored ventilation, respiratory function stable and patient connected to nasal cannula oxygen Cardiovascular status: blood pressure returned to baseline and stable Postop Assessment: no apparent nausea or vomiting Anesthetic complications: no    Last Vitals:  Vitals:   02/26/18 1219 02/26/18 1313  BP: (!) 150/77 (!) 143/74  Pulse: 87 88  Resp: 17 18  Temp: 37 C 36.6 C  SpO2: 100% 99%    Last Pain:  Vitals:   02/26/18 1313  TempSrc: Oral  PainSc:                  Ailanie Ruttan COKER

## 2018-02-26 NOTE — Op Note (Signed)
Procedure Note  Pre-operative Diagnosis:  Multiple thyroid nodules, hyperthyroidism  Post-operative Diagnosis:  same  Surgeon:  Darnell Levelodd Penda Venturi, MD  Assistant:  none   Procedure:  Total thyroidectomy  Anesthesia:  General  Estimated Blood Loss:  minimal  Drains: none         Specimen: thyroid to pathology  Indications:  Patient is referred by Dr. Carmon SailsMadhavi Avernini and Dr. Johny BlamerWilliam Harris for surgical evaluation and management of multiple thyroid nodules with hyperthyroidism. Patient had been relatively asymptomatic when she was noted on routine laboratory studies to have a markedly suppressed TSH level. Patient had noted fatigue. She had been having palpitations at night. She did have a mild tremor in the hand. Laboratory studies showed TSH level of 0.01. Patient has been started on methimazole by her endocrinologist. She underwent a nuclear medicine scan on November 13, 2017. This showed asymmetric activity in the right thyroid lobe greater than the left. Subsequent ultrasound examination was performed showing an enlarged right thyroid lobe measuring 6.5 cm. It contained a dominant nodule measuring 2.6 cm and a second inferior nodule measuring 1.8 cm. There was a 1.1 cm nodule present in the left thyroid lobe. Patient subsequently underwent fine-needle aspiration of the 2 nodules in the right thyroid lobe. This demonstrated benign cytopathology. Patient is now referred for consideration for thyroidectomy for management of multiple thyroid nodules and hyperthyroidism.   Procedure Details: Procedure [facility].  The patient was brought to the operating room and placed in a supine position on the operating room table.  Following administration of general anesthesia, the patient was positioned and then prepped and draped in the usual aseptic fashion.  After ascertaining that an adequate level of anesthesia had been achieved, a Kocher incision was made with  #15 blade.  Dissection was carried through subcutaneous tissues and platysma. Hemostasis was achieved with the electrocautery.  Skin flaps were elevated cephalad and caudad from the thyroid notch to the sternal notch.  The Mahorner self-retaining retractor was placed for exposure.  Strap muscles were incised in the midline and dissection was begun on the left side.  Strap muscles were reflected laterally.  Left thyroid lobe was normal in size with multiple small nodules.  The left lobe was gently mobilized with blunt dissection.  Superior pole vessels were dissected out and divided individually between small and medium Ligaclips with the Harmonic scalpel.  The thyroid lobe was rolled anteriorly.  Branches of the inferior thyroid artery were divided between small Ligaclips with the Harmonic scalpel.  Inferior venous tributaries were divided between Ligaclips.  Both the superior and inferior parathyroid glands were identified and preserved on their vascular pedicles.  The recurrent laryngeal nerve was identified and preserved along its course.  The ligament of Allyson SabalBerry was released with the electrocautery and the gland was mobilized onto the anterior trachea. Isthmus was mobilized across the midline.  There was an elongated pyramidal lobe present which was dissected off the thyroid cartilage and resected with the isthmus.  Dry pack was placed in the left neck.  Next, the right thyroid lobe was gently mobilized with blunt dissection.  Right thyroid lobe was moderately enlarged with a dominant central mass and an inferior nodule somewhat separate from the gland.  Superior pole vessels were dissected out and divided between small and medium Ligaclips with the Harmonic scalpel.  Superior parathyroid was identified and preserved.  Inferior venous tributaries were divided between medium Ligaclips with the Harmonic scalpel.  The right thyroid lobe was rolled anteriorly and the branches of the inferior thyroid artery divided  between small Ligaclips.  The right recurrent laryngeal nerve was identified and preserved along its course.  The ligament of Allyson Sabal was released with the electrocautery.  The right thyroid lobe was mobilized onto the anterior trachea and the remainder of the thyroid was dissected off the anterior trachea and the thyroid was completely excised.  A suture was used to mark the right lobe. The entire thyroid gland was submitted to pathology for review.  The neck was irrigated with warm saline.  Fibrillar was placed throughout the operative field.  Strap muscles were reapproximated in the midline with interrupted 3-0 Vicryl sutures.  Platysma was closed with interrupted 3-0 Vicryl sutures.  Skin was closed with a running 4-0 Monocryl subcuticular suture.  Wound was washed and dried and steri-strips were applied.  Dry gauze dressing was placed.  The patient was awakened from anesthesia and brought to the recovery room.  The patient tolerated the procedure well.   Darnell Level, MD [facility] Office: [phone]

## 2018-02-26 NOTE — Anesthesia Procedure Notes (Signed)

## 2018-02-26 NOTE — Anesthesia Preprocedure Evaluation (Addendum)
Anesthesia Evaluation  Patient identified by MRN, date of birth, ID band Patient awake    Reviewed: Allergy & Precautions, NPO status , Patient's Chart, lab work & pertinent test results  Airway Mallampati: II  TM Distance: >3 FB     Dental  (+) Teeth Intact, Implants Upper permanent retainer:   Pulmonary neg pulmonary ROS,    breath sounds clear to auscultation       Cardiovascular hypertension,  Rhythm:Regular Rate:Normal     Neuro/Psych    GI/Hepatic Neg liver ROS, GERD  ,  Endo/Other  diabetes  Renal/GU negative Renal ROS     Musculoskeletal   Abdominal   Peds  Hematology   Anesthesia Other Findings   Reproductive/Obstetrics                           Anesthesia Physical Anesthesia Plan  ASA: III  Anesthesia Plan: General   Post-op Pain Management:    Induction: Intravenous  PONV Risk Score and Plan: Ondansetron, Midazolam and Dexamethasone  Airway Management Planned: Oral ETT  Additional Equipment:   Intra-op Plan:   Post-operative Plan: Possible Post-op intubation/ventilation  Informed Consent: I have reviewed the patients History and Physical, chart, labs and discussed the procedure including the risks, benefits and alternatives for the proposed anesthesia with the patient or authorized representative who has indicated his/her understanding and acceptance.   Dental advisory given  Plan Discussed with: CRNA and Anesthesiologist  Anesthesia Plan Comments:        Anesthesia Quick Evaluation

## 2018-02-26 NOTE — Interval H&P Note (Signed)
History and Physical Interval Note:  02/26/2018 7:06 AM  Nicole Crane  has presented today for surgery, with the diagnosis of multiple thyroid nodules, hyperthyroidism.  The various methods of treatment have been discussed with the patient and family. After consideration of risks, benefits and other options for treatment, the patient has consented to    Procedure(s): TOTAL THYROIDECTOMY (N/A) as a surgical intervention .    The patient's history has been reviewed, patient examined, no change in status, stable for surgery.  I have reviewed the patient's chart and labs.  Questions were answered to the patient's satisfaction.    Darnell Levelodd Alayha Babineaux, MD [facility] Office: [phone]    Nicole Crane

## 2018-02-27 ENCOUNTER — Encounter (HOSPITAL_COMMUNITY): Payer: Self-pay | Admitting: Surgery

## 2018-02-27 DIAGNOSIS — E0520 Thyrotoxicosis with toxic multinodular goiter without thyrotoxic crisis or storm: Secondary | ICD-10-CM | POA: Diagnosis not present

## 2018-02-27 LAB — BASIC METABOLIC PANEL
Anion gap: 8 (ref 5–15)
BUN: 11 mg/dL (ref 6–20)
CO2: 29 mmol/L (ref 22–32)
Calcium: 9.7 mg/dL (ref 8.9–10.3)
Chloride: 96 mmol/L — ABNORMAL LOW (ref 101–111)
Creatinine, Ser: 0.9 mg/dL (ref 0.44–1.00)
GFR calc Af Amer: >60 mL/min (ref >60)
GFR calc non Af Amer: >60 mL/min (ref >60)
Glucose, Bld: 160 mg/dL — ABNORMAL HIGH (ref 65–99)
Potassium: 3.7 mmol/L (ref 3.5–5.1)
Sodium: 133 mmol/L — ABNORMAL LOW (ref 135–145)

## 2018-02-27 LAB — GLUCOSE, CAPILLARY: Glucose-Capillary: 230 mg/dL — ABNORMAL HIGH (ref 65–99)

## 2018-02-27 MED ORDER — TRAMADOL HCL 50 MG PO TABS: 50.0000 mg | ORAL_TABLET | Freq: Four times a day (QID) | ORAL | 0 refills | Status: DC | PRN

## 2018-02-27 MED ORDER — LEVOTHYROXINE SODIUM 88 MCG PO TABS
88.0000 ug | ORAL_TABLET | Freq: Every day | ORAL | 3 refills | Status: DC
Start: 2018-02-27 — End: 2018-04-24

## 2018-02-27 MED ORDER — CALCIUM CARBONATE ANTACID 500 MG PO CHEW: 2.0000 | CHEWABLE_TABLET | Freq: Two times a day (BID) | ORAL | 1 refills | Status: DC

## 2018-02-27 NOTE — Discharge Summary (Signed)
Physician Discharge Summary [facility]  Patient ID: Nicole Crane MRN: [id number] DOB/AGE: [birth year] 52 y.o.  Admit date: 02/26/2018 Discharge date: 02/27/2018  Admission Diagnoses:  Multiple thyroid nodules, hyperthyroidism  Discharge Diagnoses:  Principal Problem:   Hyperthyroidism Active Problems:   Multiple thyroid nodules   Discharged Condition: good  Hospital Course: Patient was admitted for observation following thyroid surgery.  Post op course was uncomplicated.  Pain was well controlled.  Tolerated diet.  Post op calcium level on morning following surgery was 9.7 mg/dl.  Patient was prepared for discharge home on POD#1.  Consults: None  Treatments: surgery: total thyroidectomy  Discharge Exam: Blood pressure 120/67, pulse 83, temperature 98.2 F (36.8 C), temperature source Oral, resp. rate 16, height 5\' 5"  (1.651 m), weight 92.1 kg (203 lb), SpO2 100 %. HEENT - clear Neck - wound dry and intact; minimal STS; voice normal Chest - clear bilaterally Cor - RRR  Disposition: Home  Discharge Instructions    Diet - low sodium heart healthy   Complete by:  As directed    Discharge instructions   Complete by:  As directed    [facility]  THYROID & PARATHYROID SURGERY:  POST-OP INSTRUCTIONS  Always review your discharge instruction sheet from the facility where your surgery was performed.  A prescription for pain medication may be given to you upon discharge.  Take your pain medication as prescribed.  If narcotic pain medicine is not needed, then you may take acetaminophen (Tylenol) or ibuprofen (Advil) as needed.  Take your usually prescribed medications unless otherwise directed.  If you need a refill on your pain medication, please contact our office during regular business hours.  Prescriptions cannot be processed by our office after 5 pm or on weekends.  Start with a light diet upon arrival home, such as soup and  crackers or toast.  Be sure to drink plenty of fluids daily.  Resume your normal diet the day after surgery.  Most patients will experience some swelling and bruising on the chest and neck area.  Ice packs will help.  Swelling and bruising can take several days to resolve.   It is common to experience some constipation after surgery.  Increasing fluid intake and taking a stool softener (Colace) will usually help or prevent this problem.  A mild laxative (Milk of Magnesia or Miralax) should be taken according to package directions if there has been no bowel movement after 48 hours.  You have steri-strips and a gauze dressing over your incision.  You may remove the gauze bandage on the second day after surgery, and you may shower at that time.  Leave your steri-strips (small skin tapes) in place directly over the incision.  These strips should remain on the skin for 5-7 days and then be removed.  You may get them wet in the shower and pat them dry.  You may resume regular (light) daily activities beginning the next day (such as daily self-care, walking, climbing stairs) gradually increasing activities as tolerated.  You may have sexual intercourse when it is comfortable.  Refrain from any heavy lifting or straining until approved by your doctor.  You may drive when you no longer are taking prescription pain medication, you can comfortably wear a seatbelt, and you can safely maneuver your car and apply brakes.  You should see your doctor in the office for a follow-up appointment approximately three weeks after your surgery.  Make sure that you call  for this appointment within a day or two after you arrive home to insure a convenient appointment time.  WHEN TO CALL YOUR DOCTOR: -- Fever greater than 101.5 -- Inability to urinate -- Nausea and/or vomiting - persistent -- Extreme swelling or bruising -- Continued bleeding from incision -- Increased pain, redness, or drainage from the incision --  Difficulty swallowing or breathing -- Muscle cramping or spasms -- Numbness or tingling in hands or around lips  The clinic staff is available to answer your questions during regular business hours.  Please don't hesitate to call and ask to speak to one of the nurses if you have concerns.  Darnell Level, MD [facility] Office: [phone]   Remove dressing in 24 hours   Complete by:  As directed      Allergies as of 02/27/2018      Reactions   Alcohol Hives, Shortness Of Breath, Swelling   Antihistamines, Diphenhydramine-type Hives   Avelox [moxifloxacin Hcl In Nacl] Other (See Comments)   hallucinations.   Dilaudid [hydromorphone Hcl] Hives, Itching   Erythromycin Base Hives   Kiwi Extract Itching   Lips itch   Levofloxacin Other (See Comments)   Hallucinations.   Morphine And Related Hives, Itching   Naproxen Other (See Comments)   Severe acid reflux--possible ulcers   Other    Anything with alcohol that she has to injest.   Penicillins Swelling, Other (See Comments)   Welts Has patient had a PCN reaction causing immediate rash, facial/tongue/throat swelling, SOB or lightheadedness with hypotension: Yes--lips swelling Has patient had a PCN reaction causing severe rash involving mucus membranes or skin necrosis: No Has patient had a PCN reaction that required hospitalization: No Has patient had a PCN reaction occurring within the last 10 years: No If all of the above answers are "NO", then may proceed with Cephalosporin use.   Prednisone Other (See Comments)   Heart races with large doses/high dose.   Pregabalin Swelling, Other (See Comments)   Fluid retention caused weight gained 50 LBS of fluid in ~ 4 days   Statins Other (See Comments)   Muscle/joint aches & pains   Sulfa Antibiotics Nausea And Vomiting   Victoza [liraglutide] Other (See Comments)   Caused visual disturbances (visual impairments)      Medication List    STOP taking these  medications   doxycycline 100 MG capsule Commonly known as:  VIBRAMYCIN     TAKE these medications   albuterol 108 (90 Base) MCG/ACT inhaler Commonly known as:  PROVENTIL HFA;VENTOLIN HFA Inhale 1-2 puffs into the lungs every 6 (six) hours as needed for wheezing or shortness of breath.   ALPRAZolam 0.5 MG tablet Commonly known as:  XANAX Take 0.5 mg by mouth at bedtime.   atorvastatin 40 MG tablet Commonly known as:  LIPITOR Take 40 mg by mouth every evening.   calcium carbonate 500 MG chewable tablet Commonly known as:  TUMS Chew 2 tablets (400 mg of elemental calcium total) by mouth 2 (two) times daily.   glimepiride 4 MG tablet Commonly known as:  AMARYL Take 4 mg by mouth 2 (two) times daily.   hydrochlorothiazide 25 MG tablet Commonly known as:  HYDRODIURIL Take 25 mg by mouth every morning.   ibuprofen 800 MG tablet Commonly known as:  ADVIL,MOTRIN Take 800 mg by mouth at bedtime.   levothyroxine 88 MCG tablet Commonly known as:  SYNTHROID Take 1 tablet (88 mcg total) by mouth daily before breakfast.   lisinopril  20 MG tablet Commonly known as:  PRINIVIL,ZESTRIL Take 20 mg by mouth daily.   pantoprazole 40 MG tablet Commonly known as:  PROTONIX Take 40 mg by mouth every evening.   sitaGLIPtin-metformin 50-1000 MG tablet Commonly known as:  JANUMET Take 1 tablet by mouth 2 (two) times daily.   traMADol 50 MG tablet Commonly known as:  ULTRAM Take 1-2 tablets (50-100 mg total) by mouth every 6 (six) hours as needed for moderate pain (mild pain not relieved wtih acetaminophen).      Follow-up Information    Darnell LevelGerkin, Hazyl Marseille, MD. Schedule an appointment as soon as possible for a visit in 3 week(s).   Specialty:  General Surgery Contact information: [address] [place] [address] [phone]        Marlene LardAverneni, Madhavi, MD. Schedule an appointment as soon as possible for a visit in 3 week(s).   Specialty:  Endocrinology Contact  information: [address] [place] [address] [phone]           Velora Hecklerodd M. Alexandro Line, MD, [facility] Office: [phone]   Signed: Velora HecklerGERKIN,Orie Baxendale M 02/27/2018, 8:19 AM

## 2018-02-28 NOTE — Progress Notes (Signed)
Please contact patient and notify of benign pathology results.  Thomson Herbers M. Gussie Murton, MD, FACS [facility] Office: [phone]   

## 2018-04-24 ENCOUNTER — Ambulatory Visit (INDEPENDENT_AMBULATORY_CARE_PROVIDER_SITE_OTHER): Payer: BC Managed Care – PPO | Admitting: Endocrinology

## 2018-04-24 ENCOUNTER — Encounter: Payer: Self-pay | Admitting: Endocrinology

## 2018-04-24 VITALS — BP 122/78 | HR 89 | Ht 64.57 in | Wt 212.0 lb

## 2018-04-24 DIAGNOSIS — Z8639 Personal history of other endocrine, nutritional and metabolic disease: Secondary | ICD-10-CM | POA: Diagnosis not present

## 2018-04-24 DIAGNOSIS — E890 Postprocedural hypothyroidism: Secondary | ICD-10-CM | POA: Diagnosis not present

## 2018-04-24 NOTE — Progress Notes (Signed)
Patient ID: Nicole Crane, female   DOB: 1966-03-17, 52 y.o.   MRN: [id number]            Referring Physician: Shirline Frees  Reason for Appointment:  Hypothyroidism, new visit    History of Present Illness:   Hypothyroidism was first diagnosed  after her subtotal thyroidectomy done on 02/26/2018  At the time of discharge she was prescribed 88 mcg of levothyroxine However subsequently over the next few weeks she started having progressively increasing fatigue, sleepiness and lethargy as well as some symptoms of depression and weight gain She was also having excessive cold intolerance especially in the evenings           She was seen by her endocrinologist about 2 weeks ago and felt to be significantly hypothyroid, however lab results are not available for review Her levothyroxine was increased up to 150 mcg daily  She is taking this consistently about 45 minutes before eating breakfast or drinking any coffee In the last few days patient has started feeling more energetic and not as cold but still feels somewhat tired  Records from previous endocrinologist are not available         Patient's weight history is as follows:  Wt Readings from Last 3 Encounters:  04/24/18 212 lb (96.2 kg)  02/26/18 203 lb (92.1 kg)  02/22/18 203 lb 6 oz (92.3 kg)    Thyroid function results have been as follows:  No results found for: TSH, FREET4, T3FREE  PRIOR history of hyperthyroidism and multinodular goiter:  Patient had presented to her PCP in 2/19 for general exam and routine thyroid testing showed an undetectable TSH level and free T4 of 1.97, normal <1.12.  At that time she did not complain of any palpitations or heat intolerance  She was seen by an endocrinologist to felt she was hyperthyroid and likely gave her methimazole Thyroid scan done on 11/12/2017 showed possible large hyperfunctional right thyroid mass and uptake 29% Ultrasound had shown nodules and she had 2 biopsies done of  the right superior and right inferior nodules  She was also seen by a general surgeon who proceeded with subtotal thyroidectomy This showed benign nodular hyperplasia and also had a pyramidal lobe removed   Past Medical History:  Diagnosis Date  . Allergic rhinitis   . Anxiety   . Bilateral leg edema   . DDD (degenerative disc disease), lumbosacral   . Diabetes mellitus without complication ([place])   . GERD (gastroesophageal reflux disease)   . High cholesterol   . Hx of blood clots    post op in abdomen after BSO  . Hypertension   . Hyperthyroidism   . Multiple thyroid nodules   . OA (osteoarthritis) of shoulder   . Spinal stenosis   . Tick bite 02/14/2018   [place] tick    Past Surgical History:  Procedure Laterality Date  . ABDOMINAL HYSTERECTOMY     complete  . BACK SURGERY    . BONE MARROW BIOPSY     after BSO to check clotting  . CHOLECYSTECTOMY    . DILATION AND CURETTAGE OF UTERUS    . ENDOMETRIAL ABLATION    . EXPLORATORY LAPAROTOMY     BSO  . MOUTH SURGERY    . ROTATOR CUFF REPAIR    . THYROIDECTOMY N/A 02/26/2018   Procedure: TOTAL THYROIDECTOMY;  Surgeon: Armandina Gemma, MD;  Location: [facility];  Service: General;  Laterality: N/A;    No family history on file.  Social History:  reports that she has never smoked. She has never used smokeless tobacco. She reports that she does not drink alcohol or use drugs.  Allergies:  Allergies  Allergen Reactions  . Alcohol Hives, Shortness Of Breath and Swelling  . Antihistamines, Diphenhydramine-Type Hives  . Avelox [Moxifloxacin Hcl In Nacl] Other (See Comments)    hallucinations.  . Dilaudid [Hydromorphone Hcl] Hives and Itching  . Erythromycin Base Hives  . Kiwi Extract Itching    Lips itch  . Levofloxacin Other (See Comments)    Hallucinations.  . Morphine And Related Hives and Itching  . Naproxen Other (See Comments)    Severe acid reflux--possible ulcers  . Other     Anything with alcohol that she has  to injest.  . Penicillins Swelling and Other (See Comments)    Welts Has patient had a PCN reaction causing immediate rash, facial/tongue/throat swelling, SOB or lightheadedness with hypotension: Yes--lips swelling Has patient had a PCN reaction causing severe rash involving mucus membranes or skin necrosis: No Has patient had a PCN reaction that required hospitalization: No Has patient had a PCN reaction occurring within the last 10 years: No If all of the above answers are "NO", then may proceed with Cephalosporin use.   . Prednisone Other (See Comments)    Heart races with large doses/high dose.  . Pregabalin Swelling and Other (See Comments)    Fluid retention caused weight gained 50 LBS of fluid in ~ 4 days  . Statins Other (See Comments)    Muscle/joint aches & pains  . Sulfa Antibiotics Nausea And Vomiting  . Victoza [Liraglutide] Other (See Comments)    Caused visual disturbances (visual impairments)    Allergies as of 04/24/2018      Reactions   Alcohol Hives, Shortness Of Breath, Swelling   Antihistamines, Diphenhydramine-type Hives   Avelox [moxifloxacin Hcl In Nacl] Other (See Comments)   hallucinations.   Dilaudid [hydromorphone Hcl] Hives, Itching   Erythromycin Base Hives   Kiwi Extract Itching   Lips itch   Levofloxacin Other (See Comments)   Hallucinations.   Morphine And Related Hives, Itching   Naproxen Other (See Comments)   Severe acid reflux--possible ulcers   Other    Anything with alcohol that she has to injest.   Penicillins Swelling, Other (See Comments)   Welts Has patient had a PCN reaction causing immediate rash, facial/tongue/throat swelling, SOB or lightheadedness with hypotension: Yes--lips swelling Has patient had a PCN reaction causing severe rash involving mucus membranes or skin necrosis: No Has patient had a PCN reaction that required hospitalization: No Has patient had a PCN reaction occurring within the last 10 years: No If all of the  above answers are "NO", then may proceed with Cephalosporin use.   Prednisone Other (See Comments)   Heart races with large doses/high dose.   Pregabalin Swelling, Other (See Comments)   Fluid retention caused weight gained 50 LBS of fluid in ~ 4 days   Statins Other (See Comments)   Muscle/joint aches & pains   Sulfa Antibiotics Nausea And Vomiting   Victoza [liraglutide] Other (See Comments)   Caused visual disturbances (visual impairments)      Medication List        Accurate as of 04/24/18  4:14 PM. Always use your most recent med list.          albuterol 108 (90 Base) MCG/ACT inhaler Commonly known as:  PROVENTIL HFA;VENTOLIN HFA Inhale 1-2 puffs into the lungs every 6 (  six) hours as needed for wheezing or shortness of breath.   ALPRAZolam 0.5 MG tablet Commonly known as:  XANAX Take 0.5 mg by mouth at bedtime.   atorvastatin 40 MG tablet Commonly known as:  LIPITOR Take 40 mg by mouth every evening.   glimepiride 4 MG tablet Commonly known as:  AMARYL Take 4 mg by mouth 2 (two) times daily.   hydrochlorothiazide 25 MG tablet Commonly known as:  HYDRODIURIL Take 25 mg by mouth every morning.   ibuprofen 800 MG tablet Commonly known as:  ADVIL,MOTRIN Take 800 mg by mouth at bedtime.   levothyroxine 150 MCG tablet Commonly known as:  SYNTHROID, LEVOTHROID Take 150 mcg by mouth daily before breakfast.   lisinopril 20 MG tablet Commonly known as:  PRINIVIL,ZESTRIL Take 20 mg by mouth daily.   pantoprazole 40 MG tablet Commonly known as:  PROTONIX Take 40 mg by mouth every evening.   sitaGLIPtin-metformin 50-1000 MG tablet Commonly known as:  JANUMET Take 1 tablet by mouth 2 (two) times daily.   traMADol 50 MG tablet Commonly known as:  ULTRAM Take 1-2 tablets (50-100 mg total) by mouth every 6 (six) hours as needed for moderate pain (mild pain not relieved wtih acetaminophen).          Review of Systems  Constitutional: Positive for weight gain.    HENT: Negative for trouble swallowing.        She may occasionally get hoarse, this is since her surgery.  Occasionally may feel a slight catch in her throat but no difficulty swallowing  Eyes: Negative for blurred vision.  Respiratory: Positive for shortness of breath.   Cardiovascular: Negative for leg swelling.  Gastrointestinal: Negative for constipation.  Endocrine: Positive for fatigue.  Musculoskeletal: Negative for muscle cramps.  Neurological: Negative for numbness and tingling.  Psychiatric/Behavioral: Negative for insomnia.          DIABETES: She appears to have poorly controlled diabetes with A1c consistently over 7% over the last 2 years He has not had an A1c since 2/19 when it was 8.8 However she thinks that her blood sugar recently with improving her diet and reducing stress as well controlled with fasting readings 120-150 and after meals up to 190 She reportedly had intolerance to Victoza and also to Bydureon at some point She does not want to review her diabetes management today and is scheduled to follow-up with her PCP next month        Examination:    BP 122/78   Pulse 89   Ht 5' 4.57" (1.64 m)   Wt 212 lb (96.2 kg)   SpO2 99%   BMI 35.75 kg/m   GENERAL:  Average build.  Mild generalized obesity present  No pallor, clubbing, cervical lymphadenopathy or edema.  Skin:  no rash or pigmentation.  EYES:  No prominence of the eyes or swelling of the eyelids  ENT: Oral mucosa and tongue normal.  THYROID:  Not palpable.  Scar of thyroidectomy present  HEART:  Normal  S1 and S2; no murmur or click.  CHEST:    Lungs: Vescicular breath sounds heard equally.  No crepitations/ wheeze.  ABDOMEN:  No distention.  Liver and spleen not palpable.  No other mass or tenderness.  NEUROLOGICAL: Reflexes are bilaterally normal at biceps.  JOINTS:  Normal peripheral joints.   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, treated surgically She did have benign pathology of her thyroid tissue removed at surgery Currently doing well and has recovered from her surgery without significant sequelae and no resulting hypoparathyroidism also  PLAN:   Since she is subjectively doing well will need to wait at least another 4 weeks to recheck her thyroid levels to assess her dosage requirement for levothyroxine However she can call and be seen sooner if she starts getting hyperthyroid symptoms or increasing fatigue again She will be called regarding her lab results next month  Today have requested records from the previous treating endocrinologist for review  Follow-up in 3 months with repeat labs  For her diabetes she will follow-up with PCP and discussed that if she is continues to have difficulty with control or persistently high A1c over 7% may need to review her management   Elayne Snare 04/24/2018, 4:14 PM   Consultation note copy sent to the PCP  Note: This office note was prepared with Dragon voice recognition system technology. Any transcriptional errors that result from this process are unintentional.

## 2018-05-22 ENCOUNTER — Other Ambulatory Visit (INDEPENDENT_AMBULATORY_CARE_PROVIDER_SITE_OTHER): Payer: BC Managed Care – PPO

## 2018-05-22 ENCOUNTER — Other Ambulatory Visit: Payer: Self-pay | Admitting: Endocrinology

## 2018-05-22 DIAGNOSIS — E890 Postprocedural hypothyroidism: Secondary | ICD-10-CM

## 2018-05-22 LAB — T4, FREE: Free T4: 1.64 ng/dL — ABNORMAL HIGH (ref 0.60–1.60)

## 2018-05-22 LAB — T3, FREE: T3, Free: 3.3 pg/mL (ref 2.3–4.2)

## 2018-05-22 LAB — TSH: TSH: 0.15 u[IU]/mL — ABNORMAL LOW (ref 0.35–4.50)

## 2018-05-22 MED ORDER — LEVOTHYROXINE SODIUM 125 MCG PO TABS: 125.0000 ug | ORAL_TABLET | Freq: Every day | ORAL | 2 refills | Status: DC

## 2018-05-27 ENCOUNTER — Telehealth: Payer: Self-pay

## 2018-05-27 NOTE — Telephone Encounter (Signed)
Pt stated that she has been on this medication before and that she does not want to take it again due to how tired it made her. (Levothyroxine) pt also thought she had an appt this week Wed. I informed pt she has appts scheduled for November and she refused to take medication until she can be seen, please advise

## 2018-05-27 NOTE — Telephone Encounter (Signed)
If she wants to discuss in the office we can give her the first available appointment.  However she needs to know that she had fatigue from levothyroxine before because she was getting very low thyroid levels on the 88 mcg.  The correct dose is 125 mcg and this should not cause fatigue

## 2018-05-28 ENCOUNTER — Encounter: Payer: Self-pay | Admitting: Endocrinology

## 2018-05-28 ENCOUNTER — Ambulatory Visit (INDEPENDENT_AMBULATORY_CARE_PROVIDER_SITE_OTHER): Payer: BC Managed Care – PPO | Admitting: Endocrinology

## 2018-05-28 ENCOUNTER — Telehealth: Payer: Self-pay | Admitting: *Deleted

## 2018-05-28 VITALS — BP 122/82 | HR 90 | Resp 16 | Wt 214.0 lb

## 2018-05-28 DIAGNOSIS — E890 Postprocedural hypothyroidism: Secondary | ICD-10-CM

## 2018-05-28 MED ORDER — LEVOTHYROXINE SODIUM 137 MCG PO TABS: 137.0000 ug | ORAL_TABLET | Freq: Every day | ORAL | 2 refills | Status: DC

## 2018-05-28 NOTE — Telephone Encounter (Signed)
Spoke with [facility] for levothyroxine dose / refill history as follows:  02/27/18 88 mcg once a day 04/10/18 150 mcg once a day 05/09/18 150 mcg once a day 05/23/18 125 mcg once a day.

## 2018-05-28 NOTE — Progress Notes (Signed)
Patient ID: Nicole Crane, female   DOB: 1966-06-24, 52 y.o.   MRN: [id number]            Referring Physician: Shirline Frees  Reason for Appointment:  Hypothyroidism, new visit    History of Present Illness:   Hypothyroidism was first diagnosed  after her subtotal thyroidectomy done on 02/26/2018  At the time of discharge she was prescribed 88 mcg of levothyroxine However subsequently over the next few weeks she started having progressively increasing fatigue, sleepiness and lethargy as well as some symptoms of depression and weight gain She was also having excessive cold intolerance especially in the evenings           She was seen by her endocrinologist at the end of July and felt to be significantly hypothyroid, however lab results are not available for review, patient thinks her TSH was about 24 Her levothyroxine was increased up to 150 mcg daily  She is taking this consistently about 45 minutes before eating breakfast or drinking any coffee  More recently had not feel unusually fatigued or lethargic, no heat or cold intolerance Her weight is about the same  Recent labs show that her TSH is now low. She was recommended 125 mcg of levothyroxine instead of 150 she is taking but patient is adamant that she was feeling hypothyroid when she was taking 125 in the past for which there is no documentation available  Records from previous endocrinologist are not available but levothyroxine history from pharmacy is as follows:  02/27/18 88 mcg once a day 04/10/18 150 mcg once a day 05/09/18 150 mcg once a day   Patient's weight history is as follows:  Wt Readings from Last 3 Encounters:  05/28/18 214 lb (97.1 kg)  04/24/18 212 lb (96.2 kg)  02/26/18 203 lb (92.1 kg)    Thyroid function results have been as follows:  Lab Results  Component Value Date   TSH 0.15 (L) 05/22/2018   FREET4 1.64 (H) 05/22/2018   T3FREE 3.3 05/22/2018    PRIOR history of hyperthyroidism and  multinodular goiter:  Patient had presented to her PCP in 2/19 for general exam and routine thyroid testing showed an undetectable TSH level and free T4 of 1.97, normal <1.12.  At that time she did not complain of any palpitations or heat intolerance  She was seen by an endocrinologist to felt she was hyperthyroid and likely gave her methimazole Thyroid scan done on 11/12/2017 showed possible large hyperfunctional right thyroid mass and uptake 29% Ultrasound had shown nodules and she had 2 biopsies done of the right superior and right inferior nodules  She was also seen by a general surgeon who proceeded with subtotal thyroidectomy This showed benign nodular hyperplasia and also had a pyramidal lobe removed   Past Medical History:  Diagnosis Date  . Allergic rhinitis   . Anxiety   . Bilateral leg edema   . DDD (degenerative disc disease), lumbosacral   . Diabetes mellitus without complication ([place])   . GERD (gastroesophageal reflux disease)   . High cholesterol   . Hx of blood clots    post op in abdomen after BSO  . Hypertension   . Hyperthyroidism   . Multiple thyroid nodules   . OA (osteoarthritis) of shoulder   . Spinal stenosis   . Tick bite 02/14/2018   [place] tick    Past Surgical History:  Procedure Laterality Date  . ABDOMINAL HYSTERECTOMY     complete  . BACK  SURGERY    . BONE MARROW BIOPSY     after BSO to check clotting  . CHOLECYSTECTOMY    . DILATION AND CURETTAGE OF UTERUS    . ENDOMETRIAL ABLATION    . EXPLORATORY LAPAROTOMY     BSO  . MOUTH SURGERY    . ROTATOR CUFF REPAIR    . THYROIDECTOMY N/A 02/26/2018   Procedure: TOTAL THYROIDECTOMY;  Surgeon: Armandina Gemma, MD;  Location: [facility];  Service: General;  Laterality: N/A;    Family History  Problem Relation Age of Onset  . Thyroid disease Neg Hx     Social History:  reports that she has never smoked. She has never used smokeless tobacco. She reports that she does not drink alcohol or use  drugs.  Allergies:  Allergies  Allergen Reactions  . Alcohol Hives, Shortness Of Breath and Swelling  . Antihistamines, Diphenhydramine-Type Hives  . Avelox [Moxifloxacin Hcl In Nacl] Other (See Comments)    hallucinations.  . Dilaudid [Hydromorphone Hcl] Hives and Itching  . Erythromycin Base Hives  . Kiwi Extract Itching    Lips itch  . Levofloxacin Other (See Comments)    Hallucinations.  . Morphine And Related Hives and Itching  . Naproxen Other (See Comments)    Severe acid reflux--possible ulcers  . Other     Anything with alcohol that she has to injest.  . Penicillins Swelling and Other (See Comments)    Welts Has patient had a PCN reaction causing immediate rash, facial/tongue/throat swelling, SOB or lightheadedness with hypotension: Yes--lips swelling Has patient had a PCN reaction causing severe rash involving mucus membranes or skin necrosis: No Has patient had a PCN reaction that required hospitalization: No Has patient had a PCN reaction occurring within the last 10 years: No If all of the above answers are "NO", then may proceed with Cephalosporin use.   . Prednisone Other (See Comments)    Heart races with large doses/high dose.  . Pregabalin Swelling and Other (See Comments)    Fluid retention caused weight gained 50 LBS of fluid in ~ 4 days  . Statins Other (See Comments)    Muscle/joint aches & pains  . Sulfa Antibiotics Nausea And Vomiting  . Victoza [Liraglutide] Other (See Comments)    Caused visual disturbances (visual impairments)    Allergies as of 05/28/2018      Reactions   Alcohol Hives, Shortness Of Breath, Swelling   Antihistamines, Diphenhydramine-type Hives   Avelox [moxifloxacin Hcl In Nacl] Other (See Comments)   hallucinations.   Dilaudid [hydromorphone Hcl] Hives, Itching   Erythromycin Base Hives   Kiwi Extract Itching   Lips itch   Levofloxacin Other (See Comments)   Hallucinations.   Morphine And Related Hives, Itching    Naproxen Other (See Comments)   Severe acid reflux--possible ulcers   Other    Anything with alcohol that she has to injest.   Penicillins Swelling, Other (See Comments)   Welts Has patient had a PCN reaction causing immediate rash, facial/tongue/throat swelling, SOB or lightheadedness with hypotension: Yes--lips swelling Has patient had a PCN reaction causing severe rash involving mucus membranes or skin necrosis: No Has patient had a PCN reaction that required hospitalization: No Has patient had a PCN reaction occurring within the last 10 years: No If all of the above answers are "NO", then may proceed with Cephalosporin use.   Prednisone Other (See Comments)   Heart races with large doses/high dose.   Pregabalin Swelling, Other (See Comments)  Fluid retention caused weight gained 50 LBS of fluid in ~ 4 days   Statins Other (See Comments)   Muscle/joint aches & pains   Sulfa Antibiotics Nausea And Vomiting   Victoza [liraglutide] Other (See Comments)   Caused visual disturbances (visual impairments)      Medication List        Accurate as of 05/28/18  3:57 PM. Always use your most recent med list.          albuterol 108 (90 Base) MCG/ACT inhaler Commonly known as:  PROVENTIL HFA;VENTOLIN HFA Inhale 1-2 puffs into the lungs every 6 (six) hours as needed for wheezing or shortness of breath.   ALPRAZolam 0.5 MG tablet Commonly known as:  XANAX Take 0.5 mg by mouth at bedtime.   atorvastatin 40 MG tablet Commonly known as:  LIPITOR Take 40 mg by mouth every evening.   glimepiride 4 MG tablet Commonly known as:  AMARYL Take 4 mg by mouth 2 (two) times daily.   hydrochlorothiazide 25 MG tablet Commonly known as:  HYDRODIURIL Take 25 mg by mouth every morning.   ibuprofen 800 MG tablet Commonly known as:  ADVIL,MOTRIN Take 800 mg by mouth at bedtime.   levothyroxine 125 MCG tablet Commonly known as:  SYNTHROID, LEVOTHROID Take 1 tablet (125 mcg total) by mouth daily  before breakfast.   lisinopril 20 MG tablet Commonly known as:  PRINIVIL,ZESTRIL Take 20 mg by mouth daily.   pantoprazole 40 MG tablet Commonly known as:  PROTONIX Take 40 mg by mouth every evening.   sitaGLIPtin-metformin 50-1000 MG tablet Commonly known as:  JANUMET Take 1 tablet by mouth 2 (two) times daily.   traMADol 50 MG tablet Commonly known as:  ULTRAM Take 1-2 tablets (50-100 mg total) by mouth every 6 (six) hours as needed for moderate pain (mild pain not relieved wtih acetaminophen).          Review of Systems          DIABETES: This appears to be poorly controlled with A1c consistently over 7% over the last 2 years He has not had an A1c since 2/19 when it was 8.8 However she thinks that her blood sugar recently with improving her diet and reducing stress as well controlled with fasting readings 120-150 and after meals up to 190 She reportedly had intolerance to Victoza and also to Bydureon at some point Followed by PCP        Examination:    BP 122/82 (BP Location: Left Arm, Cuff Size: Large)   Pulse 90   Resp 16   Wt 214 lb (97.1 kg)   BMI 36.09 kg/m      Assessment:  HYPOTHYROIDISM, 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, treated surgically  Currently the patient is on 150 mcg of levothyroxine for about 6 weeks Although with this she has had much better results subjectively and previously had significant hypothyroid symptoms on the 88 mcg her TSH is now low  Today's discussion was primarily to make the patient understand the interpretation of TSH levels, target TSH levels and type of thyroid hormones available for treatment  Since her TSH is significantly low along with a high free T4 it was recommended that she try 125 mcg levothyroxine instead of 150 but she is reluctant to go down on the dose for fear of having hypothyroid symptoms  PLAN:   She will try the 137 mcg levothyroxine daily  until her next visit about  middle of November Continue to take this before breakfast Discussed that if her TSH is again low she will need to go down to 125 Will get history of her thyroid prescriptions since the patient thinks that she has taken 125 mcg before when she was feeling hypothyroid  Elayne Snare 05/28/2018, 3:57 PM   Consultation note copy sent to the PCP  Note: This office note was prepared with Dragon voice recognition system technology. Any transcriptional errors that result from this process are unintentional.

## 2018-05-28 NOTE — Telephone Encounter (Signed)
Pt has appt today

## 2018-07-29 ENCOUNTER — Other Ambulatory Visit (INDEPENDENT_AMBULATORY_CARE_PROVIDER_SITE_OTHER): Payer: BC Managed Care – PPO

## 2018-07-29 DIAGNOSIS — E890 Postprocedural hypothyroidism: Secondary | ICD-10-CM | POA: Diagnosis not present

## 2018-07-29 LAB — T4, FREE: Free T4: 1.71 ng/dL — ABNORMAL HIGH (ref 0.60–1.60)

## 2018-07-29 LAB — TSH: TSH: 0.13 u[IU]/mL — ABNORMAL LOW (ref 0.35–4.50)

## 2018-07-31 ENCOUNTER — Ambulatory Visit (INDEPENDENT_AMBULATORY_CARE_PROVIDER_SITE_OTHER): Payer: PRIVATE HEALTH INSURANCE | Admitting: Endocrinology

## 2018-07-31 ENCOUNTER — Encounter: Payer: Self-pay | Admitting: Endocrinology

## 2018-07-31 VITALS — BP 130/78 | HR 78 | Ht 64.5 in | Wt 213.0 lb

## 2018-07-31 DIAGNOSIS — E890 Postprocedural hypothyroidism: Secondary | ICD-10-CM | POA: Diagnosis not present

## 2018-07-31 MED ORDER — LEVOTHYROXINE SODIUM 112 MCG PO TABS: 112.0000 ug | ORAL_TABLET | Freq: Every day | ORAL | 2 refills | Status: DC

## 2018-07-31 NOTE — Progress Notes (Signed)
Patient ID: Nicole Crane, female   DOB: 1966-07-31, 52 y.o.   MRN: [id number]            Referring Physician: Shirline Frees  Reason for Appointment:  Hypothyroidism, new visit    History of Present Illness:   Hypothyroidism was first diagnosed  after her subtotal thyroidectomy done on 02/26/2018 for toxic nodular goiter  At the time of discharge she was prescribed 88 mcg of levothyroxine However subsequently over the next few weeks she started having progressively increasing fatigue, sleepiness and lethargy as well as some symptoms of depression and weight gain She was also having excessive cold intolerance especially in the evenings           She was seen by her endocrinologist at the end of July and felt to be significantly hypothyroid, however lab results are not available for review, patient thinks her TSH was about 24 Her levothyroxine was increased up to 150 mcg daily  RECENT history: She has been recommended lower doses of levothyroxine when her TSH was low the last 2 visits She was recommended 125 mcg of levothyroxine instead of 150 but she refused to cut it down and only agreed to reduce it to 137  She is taking thyroid supplement consistently about 45 minutes before eating breakfast or drinking any coffee   No complaints of feeling fatigued or lethargic, no heat or cold intolerance; no palpitations Her weight is about the same  Recent labs show that her TSH is again low and free T4 slightly higher.  The levothyroxine history from pharmacy is as follows:  02/27/18 88 mcg once a day 04/10/18 150 mcg once a day 05/09/18 150 mcg once a day   Patient's weight history is as follows:  Wt Readings from Last 3 Encounters:  07/31/18 213 lb (96.6 kg)  05/28/18 214 lb (97.1 kg)  04/24/18 212 lb (96.2 kg)    Thyroid function results have been as follows:  Lab Results  Component Value Date   TSH 0.13 (L) 07/29/2018   TSH 0.15 (L) 05/22/2018   FREET4 1.71 (H)  07/29/2018   FREET4 1.64 (H) 05/22/2018   T3FREE 3.3 05/22/2018    PRIOR history of hyperthyroidism and multinodular goiter:  Patient had presented to her PCP in 2/19 for general exam and routine thyroid testing showed an undetectable TSH level and free T4 of 1.97, normal <1.12.  At that time she did not complain of any palpitations or heat intolerance  She was seen by an endocrinologist to felt she was hyperthyroid and likely gave her methimazole Thyroid scan done on 11/12/2017 showed possible large hyperfunctional right thyroid mass and uptake 29% Ultrasound had shown nodules and she had 2 biopsies done of the right superior and right inferior nodules  She was also seen by a general surgeon who proceeded with subtotal thyroidectomy This showed benign nodular hyperplasia and also had a pyramidal lobe removed   Past Medical History:  Diagnosis Date  . Allergic rhinitis   . Anxiety   . Bilateral leg edema   . DDD (degenerative disc disease), lumbosacral   . Diabetes mellitus without complication ([place])   . GERD (gastroesophageal reflux disease)   . High cholesterol   . Hx of blood clots    post op in abdomen after BSO  . Hypertension   . Hyperthyroidism   . Multiple thyroid nodules   . OA (osteoarthritis) of shoulder   . Spinal stenosis   . Tick bite 02/14/2018   [place] tick    Past Surgical History:  Procedure Laterality Date  . ABDOMINAL HYSTERECTOMY     complete  . BACK SURGERY    . BONE MARROW BIOPSY     after BSO to check clotting  . CHOLECYSTECTOMY    . DILATION AND CURETTAGE OF UTERUS    . ENDOMETRIAL ABLATION    . EXPLORATORY LAPAROTOMY     BSO  . MOUTH SURGERY    . ROTATOR CUFF REPAIR    . THYROIDECTOMY N/A 02/26/2018   Procedure: TOTAL THYROIDECTOMY;  Surgeon: Armandina Gemma, MD;  Location: [facility];  Service: General;  Laterality: N/A;    Family History  Problem Relation Age of Onset  . Thyroid disease Neg Hx     Social History:  reports that she has  never smoked. She has never used smokeless tobacco. She reports that she does not drink alcohol or use drugs.  Allergies:  Allergies  Allergen Reactions  . Alcohol Hives, Shortness Of Breath and Swelling  . Antihistamines, Diphenhydramine-Type Hives  . Avelox [Moxifloxacin Hcl In Nacl] Other (See Comments)    hallucinations.  . Dilaudid [Hydromorphone Hcl] Hives and Itching  . Erythromycin Base Hives  . Kiwi Extract Itching    Lips itch  . Levofloxacin Other (See Comments)    Hallucinations.  . Morphine And Related Hives and Itching  . Naproxen Other (See Comments)    Severe acid reflux--possible ulcers  . Other     Anything with alcohol that she has to injest.  . Penicillins Swelling and Other (See Comments)    Welts Has patient had a PCN reaction causing immediate rash, facial/tongue/throat swelling, SOB or lightheadedness with hypotension: Yes--lips swelling Has patient had a PCN reaction causing severe rash involving mucus membranes or skin necrosis: No Has patient had a PCN reaction that required hospitalization: No Has patient had a PCN reaction occurring within the last 10 years: No If all of the above answers are "NO", then may proceed with Cephalosporin use.   . Prednisone Other (See Comments)    Heart races with large doses/high dose.  . Pregabalin Swelling and Other (See Comments)    Fluid retention caused weight gained 50 LBS of fluid in ~ 4 days  . Statins Other (See Comments)    Muscle/joint aches & pains  . Sulfa Antibiotics Nausea And Vomiting  . Victoza [Liraglutide] Other (See Comments)    Caused visual disturbances (visual impairments)    Allergies as of 07/31/2018      Reactions   Alcohol Hives, Shortness Of Breath, Swelling   Antihistamines, Diphenhydramine-type Hives   Avelox [moxifloxacin Hcl In Nacl] Other (See Comments)   hallucinations.   Dilaudid [hydromorphone Hcl] Hives, Itching   Erythromycin Base Hives   Kiwi Extract Itching   Lips itch     Levofloxacin Other (See Comments)   Hallucinations.   Morphine And Related Hives, Itching   Naproxen Other (See Comments)   Severe acid reflux--possible ulcers   Other    Anything with alcohol that she has to injest.   Penicillins Swelling, Other (See Comments)   Welts Has patient had a PCN reaction causing immediate rash, facial/tongue/throat swelling, SOB or lightheadedness with hypotension: Yes--lips swelling Has patient had a PCN reaction causing severe rash involving mucus membranes or skin necrosis: No Has patient had a PCN reaction that required hospitalization: No Has patient had a PCN reaction occurring within the last 10 years: No If all of the above answers are "NO", then may proceed  with Cephalosporin use.   Prednisone Other (See Comments)   Heart races with large doses/high dose.   Pregabalin Swelling, Other (See Comments)   Fluid retention caused weight gained 50 LBS of fluid in ~ 4 days   Statins Other (See Comments)   Muscle/joint aches & pains   Sulfa Antibiotics Nausea And Vomiting   Victoza [liraglutide] Other (See Comments)   Caused visual disturbances (visual impairments)      Medication List        Accurate as of 07/31/18  1:11 PM. Always use your most recent med list.          albuterol 108 (90 Base) MCG/ACT inhaler Commonly known as:  PROVENTIL HFA;VENTOLIN HFA Inhale 1-2 puffs into the lungs every 6 (six) hours as needed for wheezing or shortness of breath.   ALPRAZolam 0.5 MG tablet Commonly known as:  XANAX Take 0.5 mg by mouth at bedtime.   atorvastatin 40 MG tablet Commonly known as:  LIPITOR Take 40 mg by mouth every evening.   glimepiride 4 MG tablet Commonly known as:  AMARYL Take 4 mg by mouth 2 (two) times daily.   hydrochlorothiazide 25 MG tablet Commonly known as:  HYDRODIURIL Take 25 mg by mouth every morning.   ibuprofen 800 MG tablet Commonly known as:  ADVIL,MOTRIN Take 800 mg by mouth at bedtime.   levothyroxine 137  MCG tablet Commonly known as:  SYNTHROID, LEVOTHROID Take 1 tablet (137 mcg total) by mouth daily before breakfast.   lisinopril 20 MG tablet Commonly known as:  PRINIVIL,ZESTRIL Take 20 mg by mouth daily.   pantoprazole 40 MG tablet Commonly known as:  PROTONIX Take 40 mg by mouth every evening.   sitaGLIPtin-metformin 50-1000 MG tablet Commonly known as:  JANUMET Take 1 tablet by mouth 2 (two) times daily.   traMADol 50 MG tablet Commonly known as:  ULTRAM Take 1-2 tablets (50-100 mg total) by mouth every 6 (six) hours as needed for moderate pain (mild pain not relieved wtih acetaminophen).          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, treated surgically  Currently the patient is on 137 mcg of levothyroxine  However her thyroid levels continue to be staying high This may be possibly related to some remnant of her toxic nodular goiter Currently asymptomatic Previously patient has refused to reduce her thyroid dose down because of fear of getting hypothyroid  DIABETES: Followed by PCP and discussed that her treatment is currently inadequate and she will follow-up next month  PLAN:   She will try to reduce her levothyroxine dose down to 112 She will discuss her swallowing difficulty since surgery with PCP or surgeon Follow-up in 2 months  If her PCP wants diabetes  consultation will need an extended visit  Elayne Snare 07/31/2018, 1:11 PM     Note: This office note was prepared with Dragon voice recognition system technology. Any transcriptional errors that result from this process are unintentional.

## 2018-08-28 ENCOUNTER — Other Ambulatory Visit: Payer: Self-pay | Admitting: Endocrinology

## 2018-09-27 ENCOUNTER — Other Ambulatory Visit (INDEPENDENT_AMBULATORY_CARE_PROVIDER_SITE_OTHER): Payer: BC Managed Care – PPO

## 2018-09-27 DIAGNOSIS — E890 Postprocedural hypothyroidism: Secondary | ICD-10-CM | POA: Diagnosis not present

## 2018-09-27 LAB — T4, FREE: Free T4: 1.39 ng/dL (ref 0.60–1.60)

## 2018-09-27 LAB — T3, FREE: T3, Free: 3.2 pg/mL (ref 2.3–4.2)

## 2018-09-27 LAB — TSH: TSH: 0.85 u[IU]/mL (ref 0.35–4.50)

## 2018-10-01 ENCOUNTER — Ambulatory Visit: Payer: PRIVATE HEALTH INSURANCE | Admitting: Endocrinology

## 2018-10-02 ENCOUNTER — Ambulatory Visit: Payer: PRIVATE HEALTH INSURANCE | Admitting: Endocrinology

## 2018-10-03 ENCOUNTER — Encounter: Payer: Self-pay | Admitting: Endocrinology

## 2018-10-03 ENCOUNTER — Ambulatory Visit: Payer: BC Managed Care – PPO | Admitting: Endocrinology

## 2018-10-03 VITALS — BP 132/70 | HR 86 | Ht 64.5 in | Wt 213.6 lb

## 2018-10-03 DIAGNOSIS — E890 Postprocedural hypothyroidism: Secondary | ICD-10-CM | POA: Diagnosis not present

## 2018-10-03 MED ORDER — LEVOTHYROXINE SODIUM 125 MCG PO TABS: 125.0000 ug | ORAL_TABLET | Freq: Every day | ORAL | 1 refills | Status: DC

## 2018-10-03 NOTE — Progress Notes (Signed)
Patient ID: Nicole Crane, female   DOB: [birth year], 53 y.o.   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  At the time of discharge she was prescribed 88 mcg of levothyroxine However subsequently over the next few weeks she started having progressively increasing fatigue, sleepiness and lethargy as well as some symptoms of depression and weight gain She was also having excessive cold intolerance especially in the evenings           She was seen by her endocrinologist at the end of July and felt to be significantly hypothyroid, however lab results are not available for review, patient thinks her TSH was about 24 Her levothyroxine was increased up to 150 mcg daily  RECENT history: She has been recommended lower doses of levothyroxine when her TSH was low at previous visits consistently She had previously refused to change but finally agreed to reduce the dose to 137 Because of her free T4 being significantly high for a second time and TSH only 0.13 she was recommended 112 mcg instead of 137  She states that by mistake she was starting to use the 125 dose that she had at home even though she filled her 112 mcg prescription November 2019  She feels fairly good and does not think she feels any different with the change. No heat or cold intolerance; lethargy or fatigue Her weight is about the same  She is taking thyroid supplement consistently about 45 minutes before eating breakfast or drinking any coffee Recent labs show that her TSH is back to normal and her free T4 and free T3 are also quite normal  The levothyroxine history from pharmacy is as follows:  02/27/18 88 mcg once a day 04/10/18 150 mcg once a day 05/09/18 150 mcg once a day  Patient's weight history is  as follows:  Wt Readings from Last 3 Encounters:  10/03/18 213 lb 9.6 oz (96.9 kg)  07/31/18 213 lb (96.6 kg)  05/28/18 214 lb (97.1 kg)    Thyroid function results have been as follows:  Lab Results  Component Value Date   TSH 0.85 09/27/2018   TSH 0.13 (L) 07/29/2018   TSH 0.15 (L) 05/22/2018   FREET4 1.39 09/27/2018   FREET4 1.71 (H) 07/29/2018   FREET4 1.64 (H) 05/22/2018   T3FREE 3.2 09/27/2018   T3FREE 3.3 05/22/2018    PRIOR history of hyperthyroidism and multinodular goiter:  Patient had presented to her PCP in 2/19 for general exam and routine thyroid testing showed an undetectable TSH level and free T4 of 1.97, normal <1.12.  At that time she did not complain of any palpitations or heat intolerance  She was seen by an endocrinologist to felt she was hyperthyroid and likely gave her methimazole Thyroid scan done on 11/12/2017 showed possible large hyperfunctional right thyroid mass and uptake 29% Ultrasound had shown nodules and she had 2 biopsies done of the right superior and right inferior nodules  She was also seen by a general surgeon who proceeded with subtotal thyroidectomy This showed benign nodular hyperplasia and also had a pyramidal lobe removed   Past Medical History:  Diagnosis Date  . Allergic rhinitis   . Anxiety   . Bilateral leg edema   . DDD (degenerative  disc disease), lumbosacral   . Diabetes mellitus without complication ([place])   . GERD (gastroesophageal reflux disease)   . High cholesterol   . Hx of blood clots    post op in abdomen after BSO  . Hypertension   . Hyperthyroidism   . Multiple thyroid nodules   . OA (osteoarthritis) of shoulder   . Spinal stenosis   . Tick bite 02/14/2018   [place] tick    Past Surgical History:  Procedure Laterality Date  . ABDOMINAL HYSTERECTOMY     complete  . BACK SURGERY    . BONE MARROW BIOPSY     after BSO to check clotting  . CHOLECYSTECTOMY    . DILATION AND CURETTAGE OF UTERUS    .  ENDOMETRIAL ABLATION    . EXPLORATORY LAPAROTOMY     BSO  . MOUTH SURGERY    . ROTATOR CUFF REPAIR    . THYROIDECTOMY N/A 02/26/2018   Procedure: TOTAL THYROIDECTOMY;  Surgeon: Armandina Gemma, MD;  Location: [facility];  Service: General;  Laterality: N/A;    Family History  Problem Relation Age of Onset  . Thyroid disease Neg Hx     Social History:  reports that she has never smoked. She has never used smokeless tobacco. She reports that she does not drink alcohol or use drugs.  Allergies:  Allergies  Allergen Reactions  . Alcohol Hives, Shortness Of Breath and Swelling  . Antihistamines, Diphenhydramine-Type Hives  . Avelox [Moxifloxacin Hcl In Nacl] Other (See Comments)    hallucinations.  . Dilaudid [Hydromorphone Hcl] Hives and Itching  . Erythromycin Base Hives  . Kiwi Extract Itching    Lips itch  . Levofloxacin Other (See Comments)    Hallucinations.  . Morphine And Related Hives and Itching  . Naproxen Other (See Comments)    Severe acid reflux--possible ulcers  . Other     Anything with alcohol that she has to injest.  . Penicillins Swelling and Other (See Comments)    [place]ts Has patient had a PCN reaction causing immediate rash, facial/tongue/throat swelling, SOB or lightheadedness with hypotension: Yes--lips swelling Has patient had a PCN reaction causing severe rash involving mucus membranes or skin necrosis: No Has patient had a PCN reaction that required hospitalization: No Has patient had a PCN reaction occurring within the last 10 years: No If all of the above answers are "NO", then may proceed with Cephalosporin use.   . Prednisone Other (See Comments)    Heart races with large doses/high dose.  . Pregabalin Swelling and Other (See Comments)    Fluid retention caused weight gained 50 LBS of fluid in ~ 4 days  . Statins Other (See Comments)    Muscle/joint aches & pains  . Sulfa Antibiotics Nausea And Vomiting  . Victoza [Liraglutide] Other (See Comments)     Caused visual disturbances (visual impairments)    Allergies as of 10/03/2018      Reactions   Alcohol Hives, Shortness Of Breath, Swelling   Antihistamines, Diphenhydramine-type Hives   Avelox [moxifloxacin Hcl In Nacl] Other (See Comments)   hallucinations.   Dilaudid [hydromorphone Hcl] Hives, Itching   Erythromycin Base Hives   Kiwi Extract Itching   Lips itch   Levofloxacin Other (See Comments)   Hallucinations.   Morphine And Related Hives, Itching   Naproxen Other (See Comments)   Severe acid reflux--possible ulcers   Other    Anything with alcohol that she has to injest.   Penicillins Swelling, Other (See  Comments)   Welts Has patient had a PCN reaction causing immediate rash, facial/tongue/throat swelling, SOB or lightheadedness with hypotension: Yes--lips swelling Has patient had a PCN reaction causing severe rash involving mucus membranes or skin necrosis: No Has patient had a PCN reaction that required hospitalization: No Has patient had a PCN reaction occurring within the last 10 years: No If all of the above answers are "NO", then may proceed with Cephalosporin use.   Prednisone Other (See Comments)   Heart races with large doses/high dose.   Pregabalin Swelling, Other (See Comments)   Fluid retention caused weight gained 50 LBS of fluid in ~ 4 days   Statins Other (See Comments)   Muscle/joint aches & pains   Sulfa Antibiotics Nausea And Vomiting   Victoza [liraglutide] Other (See Comments)   Caused visual disturbances (visual impairments)      Medication List       Accurate as of October 03, 2018  9:02 AM. Always use your most recent med list.        albuterol 108 (90 Base) MCG/ACT inhaler Commonly known as:  PROVENTIL HFA;VENTOLIN HFA Inhale 1-2 puffs into the lungs every 6 (six) hours as needed for wheezing or shortness of breath.   ALPRAZolam 0.5 MG tablet Commonly known as:  XANAX Take 0.5 mg by mouth at bedtime.   atorvastatin 40 MG  tablet Commonly known as:  LIPITOR Take 40 mg by mouth every evening.   glimepiride 4 MG tablet Commonly known as:  AMARYL Take 4 mg by mouth 2 (two) times daily.   hydrochlorothiazide 25 MG tablet Commonly known as:  HYDRODIURIL Take 25 mg by mouth every morning.   ibuprofen 800 MG tablet Commonly known as:  ADVIL,MOTRIN Take 800 mg by mouth at bedtime.   levothyroxine 125 MCG tablet Commonly known as:  SYNTHROID, LEVOTHROID Take 125 mcg by mouth daily before breakfast. TAKE 1 TABLET BY MOUTH ONCE DAILY.   levothyroxine 112 MCG tablet Commonly known as:  SYNTHROID, LEVOTHROID TAKE ONE TABLET BY MOUTH DAILY BEFORE BREAKFAST   lisinopril 20 MG tablet Commonly known as:  PRINIVIL,ZESTRIL Take 20 mg by mouth daily.   pantoprazole 40 MG tablet Commonly known as:  PROTONIX Take 40 mg by mouth every evening.   sitaGLIPtin-metformin 50-1000 MG tablet Commonly known as:  JANUMET Take 1 tablet by mouth 2 (two) times daily.   traMADol 50 MG tablet Commonly known as:  ULTRAM Take 1-2 tablets (50-100 mg total) by mouth every 6 (six) hours as needed for moderate pain (mild pain not relieved wtih acetaminophen).          Review of Systems        DIABETES: This appears to be poorly controlled with A1c consistently over 7% over the last 2 years, currently managed by PCP  Last A1c was 9.7 in September 2019 and she is concerned about her high blood sugars She is trying to cut back on carbohydrates but blood sugars are mostly over 200  She reportedly had intolerance to Victoza did not continue Trulicity or Bydureon which were effective but apparently not covered by her insurance  To be followed by PCP next month  She had a bone density done through gynecologist and reportedly has osteoporosis and is going to be seen in a couple of weeks for office visit  She still has a little difficulty swallowing and was told by her thyroid surgeon that this will resolve slowly         Examination:  BP 132/70 (BP Location: Left Arm, Patient Position: Sitting, Cuff Size: Normal)   Pulse 86   Ht 5' 4.5" (1.638 m)   Wt 213 lb 9.6 oz (96.9 kg)   SpO2 98%   BMI 36.10 kg/m   No mass felt in the thyroid area Biceps reflexes are normal Skin appears normal  Assessment:  HYPOTHYROIDISM, 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, treated surgically  Although previously she had significantly high T4 levels and low TSH with 137 mcg levothyroxine her labs are looking excellent with taking 125 mcg daily currently  She is subjectively doing well also  DIABETES: Followed by PCP and if she is not able to get a new treatment started will need to see her back here  Also discussed that if she has the need for specific treatment for her osteoporosis she can be seen here in consultation  PLAN:   No change in levothyroxine To take this consistently before breakfast Follow-up in 4 months for thyroid  Elayne Snare 10/03/2018, 9:02 AM     Note: This office note was prepared with Dragon voice recognition system technology. Any transcriptional errors that result from this process are unintentional.

## 2018-11-21 ENCOUNTER — Encounter: Payer: Self-pay | Admitting: Internal Medicine

## 2018-11-21 ENCOUNTER — Telehealth: Payer: Self-pay | Admitting: Endocrinology

## 2018-11-21 ENCOUNTER — Encounter: Payer: Self-pay | Admitting: Endocrinology

## 2018-11-21 NOTE — Progress Notes (Addendum)
Patient ID: KEONI HAVEY, female   DOB: 03/22/1966, 53 y.o.   MRN: [id number]           Reason for Appointment: Type II Diabetes consultation  History of Present Illness   Diagnosis date:  About 2005  Previous history: She had gestational diabetes in the past Subsequently she developed diabetes which initially was mild and treated with metformin alone In the past her highest weight has been about 300 pounds She thinks her blood sugars are relatively well controlled for a few years with this and subsequently Amaryl added About 4 years ago for better control was given Bydureon which apparently kept her sugars controlled and she had lost some weight Because of high out-of-pocket expense this was stopped about 2 years ago  Recent history:     Non-insulin hypoglycemic drugs: Amaryl 4 mg twice daily, Janumet 50/1000, twice daily           Her most recent A1c was 10.7 done in 10/2018    Side effects from medications:  Visual disturbance from Victoza  Current self management, blood sugar patterns and problems identified:  . She thinks her blood sugars have been higher for the last 6 to 8 months after her thyroid surgery, her A1c was 7.9 in June 2019 . She has been continued on the same regimen for her diabetes she has requested a consultation for improved control . She thinks that she is very consistent with watching her portions, carbohydrates and eating a little more protein . Recently her higher weight has stabilized over the last year she may have gained some weight after treatment of her hyperthyroidism and surgery on the thyroid . She appears to have high blood sugars throughout the day and relatively higher in the late afternoon and evenings . She is feeling more tired overall especially when her sugars are higher . Does not complain of excessive thirst or urination but tries to drink up to 12 glasses of water daily, sometimes also has blurred vision  Exercise: Recently not  able to do any because of fracture of her foot  Diet management: She is usually cutting back on carbohydrate intake and avoiding all sweetened drinks      Monitors blood glucose: Once a day.    Glucometer: One [employer].           Blood Glucose readings from meter download:   PRE-MEAL Fasting Lunch Dinner Bedtime Overall  Glucose range:  204-303   186-338    Mean/median:  230 247  322   269   POST-MEAL PC Breakfast PC Lunch PC Dinner  Glucose range:    186-379  Mean/median:    265     Hypoglycemia:  none                        Dietician visit: Most recent:     At diagnosis  Weight control:  Wt Readings from Last 3 Encounters:  10/03/18 213 lb 9.6 oz (96.9 kg)  07/31/18 213 lb (96.6 kg)  05/28/18 214 lb (97.1 kg)          Complications:     Diabetes labs:  Lab Results  Component Value Date   HGBA1C 7.9 (H) 02/22/2018   HGBA1C (H) 03/16/2010    11.7 (NOTE)  According to the ADA Clinical Practice Recommendations for 2011, when HbA1c is used as a screening test:   >=6.5%   Diagnostic of Diabetes Mellitus           (if abnormal result  is confirmed)  5.7-6.4%   Increased risk of developing Diabetes Mellitus  References:Diagnosis and Classification of Diabetes Mellitus,Diabetes GYIR,4854,62(VOJJK 1):S62-S69 and Standards of Medical Care in         Diabetes - 2011,Diabetes KXFG,1829,93  (Suppl 1):S11-S61.   Lab Results  Component Value Date   CREATININE 0.90 02/27/2018     Allergies as of 11/22/2018      Reactions   Alcohol Hives, Shortness Of Breath, Swelling   Antihistamines, Diphenhydramine-type Hives   Avelox [moxifloxacin Hcl In Nacl] Other (See Comments)   hallucinations.   Dilaudid [hydromorphone Hcl] Hives, Itching   Erythromycin Base Hives   Kiwi Extract Itching   Lips itch   Levofloxacin Other (See Comments)   Hallucinations.   Morphine And Related Hives, Itching   Naproxen Other  (See Comments)   Severe acid reflux--possible ulcers   Other    Anything with alcohol that she has to injest.   Penicillins Swelling, Other (See Comments)   Welts Has patient had a PCN reaction causing immediate rash, facial/tongue/throat swelling, SOB or lightheadedness with hypotension: Yes--lips swelling Has patient had a PCN reaction causing severe rash involving mucus membranes or skin necrosis: No Has patient had a PCN reaction that required hospitalization: No Has patient had a PCN reaction occurring within the last 10 years: No If all of the above answers are "NO", then may proceed with Cephalosporin use.   Prednisone Other (See Comments)   Heart races with large doses/high dose.   Pregabalin Swelling, Other (See Comments)   Fluid retention caused weight gained 50 LBS of fluid in ~ 4 days   Statins Other (See Comments)   Muscle/joint aches & pains   Sulfa Antibiotics Nausea And Vomiting   Victoza [liraglutide] Other (See Comments)   Caused visual disturbances (visual impairments)      Medication List       Accurate as of November 22, 2018 11:38 AM. Always use your most recent med list.        albuterol 108 (90 Base) MCG/ACT inhaler Commonly known as:  PROVENTIL HFA;VENTOLIN HFA Inhale 1-2 puffs into the lungs every 6 (six) hours as needed for wheezing or shortness of breath.   ALPRAZolam 0.5 MG tablet Commonly known as:  XANAX Take 0.5 mg by mouth at bedtime.   atorvastatin 40 MG tablet Commonly known as:  LIPITOR Take 40 mg by mouth every evening.   dapagliflozin propanediol 5 MG Tabs tablet Commonly known as:  Farxiga Take 5 mg by mouth daily.   glimepiride 4 MG tablet Commonly known as:  AMARYL Take 4 mg by mouth 2 (two) times daily.   hydrochlorothiazide 25 MG tablet Commonly known as:  HYDRODIURIL Take 25 mg by mouth every morning.   ibuprofen 800 MG tablet Commonly known as:  ADVIL,MOTRIN Take 800 mg by mouth at bedtime.   levothyroxine 125 MCG  tablet Commonly known as:  SYNTHROID, LEVOTHROID Take 1 tablet (125 mcg total) by mouth daily before breakfast. TAKE 1 TABLET BY MOUTH ONCE DAILY.   lisinopril 20 MG tablet Commonly known as:  PRINIVIL,ZESTRIL Take 20 mg by mouth daily.   pantoprazole 40 MG tablet Commonly known as:  PROTONIX Take 40 mg by mouth every evening.   sitaGLIPtin-metformin 50-1000 MG tablet Commonly known  as:  JANUMET Take 1 tablet by mouth 2 (two) times daily.   traMADol 50 MG tablet Commonly known as:  ULTRAM Take 1-2 tablets (50-100 mg total) by mouth every 6 (six) hours as needed for moderate pain (mild pain not relieved wtih acetaminophen).       Allergies:  Allergies  Allergen Reactions  . Alcohol Hives, Shortness Of Breath and Swelling  . Antihistamines, Diphenhydramine-Type Hives  . Avelox [Moxifloxacin Hcl In Nacl] Other (See Comments)    hallucinations.  . Dilaudid [Hydromorphone Hcl] Hives and Itching  . Erythromycin Base Hives  . Kiwi Extract Itching    Lips itch  . Levofloxacin Other (See Comments)    Hallucinations.  . Morphine And Related Hives and Itching  . Naproxen Other (See Comments)    Severe acid reflux--possible ulcers  . Other     Anything with alcohol that she has to injest.  . Penicillins Swelling and Other (See Comments)    Welts Has patient had a PCN reaction causing immediate rash, facial/tongue/throat swelling, SOB or lightheadedness with hypotension: Yes--lips swelling Has patient had a PCN reaction causing severe rash involving mucus membranes or skin necrosis: No Has patient had a PCN reaction that required hospitalization: No Has patient had a PCN reaction occurring within the last 10 years: No If all of the above answers are "NO", then may proceed with Cephalosporin use.   . Prednisone Other (See Comments)    Heart races with large doses/high dose.  . Pregabalin Swelling and Other (See Comments)    Fluid retention caused weight gained 50 LBS of fluid in  ~ 4 days  . Statins Other (See Comments)    Muscle/joint aches & pains  . Sulfa Antibiotics Nausea And Vomiting  . Victoza [Liraglutide] Other (See Comments)    Caused visual disturbances (visual impairments)    Past Medical History:  Diagnosis Date  . Allergic rhinitis   . Anxiety   . Bilateral leg edema   . DDD (degenerative disc disease), lumbosacral   . Diabetes mellitus without complication ([place])   . GERD (gastroesophageal reflux disease)   . High cholesterol   . Hx of blood clots    post op in abdomen after BSO  . Hypertension   . Hyperthyroidism   . Multiple thyroid nodules   . OA (osteoarthritis) of shoulder   . Spinal stenosis   . Tick bite 02/14/2018   [place] tick    Past Surgical History:  Procedure Laterality Date  . ABDOMINAL HYSTERECTOMY     complete  . BACK SURGERY    . BONE MARROW BIOPSY     after BSO to check clotting  . CHOLECYSTECTOMY    . DILATION AND CURETTAGE OF UTERUS    . ENDOMETRIAL ABLATION    . EXPLORATORY LAPAROTOMY     BSO  . MOUTH SURGERY    . ROTATOR CUFF REPAIR    . THYROIDECTOMY N/A 02/26/2018   Procedure: TOTAL THYROIDECTOMY;  Surgeon: Armandina Gemma, MD;  Location: [facility];  Service: General;  Laterality: N/A;    Family History  Problem Relation Age of Onset  . Diabetes Brother   . Thyroid disease Neg Hx     Social History:  reports that she has never smoked. She has never used smokeless tobacco. She reports that she does not drink alcohol or use drugs.  Review of Systems:  Hypertension:  On treatment for at least 2 years from her PCP  Lipids: Has been on atorvastatin 40 mg  for quite some time followed by PCP Her last LDL in 2/20 was 91   HYPOTHYROIDISM: See notes from 10/03/2018  He does not have any symptoms of numbness, tingling or burning in her feet  Has regular eye exams including in 2019 which did not show any retinopathy  No pain in the calf muscles on walking  Previous weight range 196-300 pounds     LABS:  Serum creatinine was 1.03 done in 2/20 outside lab Urine microalbumin ratio 25 also done in 2/20  No visits with results within 1 Week(s) from this visit.  Latest known visit with results is:  Lab on 09/27/2018  Component Date Value Ref Range Status  . T3, Free 09/27/2018 3.2  2.3 - 4.2 pg/mL Final  . TSH 09/27/2018 0.85  0.35 - 4.50 uIU/mL Final  . Free T4 09/27/2018 1.39  0.60 - 1.60 ng/dL Final   Comment: Specimens from patients who are undergoing biotin therapy and /or ingesting biotin supplements may contain high levels of biotin.  The higher biotin concentration in these specimens interferes with this Free T4 assay.  Specimens that contain high levels  of biotin may cause false high results for this Free T4 assay.  Please interpret results in light of the total clinical presentation of the patient.       Examination:   Ht 5' 4.5" (1.638 m)   BMI 36.10 kg/m   Body mass index is 36.1 kg/m.    Physical Exam Constitutional:      Appearance: She is obese.  Eyes:     Comments: Fundus exam shows no obvious retinopathy  Cardiovascular:     Rate and Rhythm: Regular rhythm.     Heart sounds: Normal heart sounds.     Comments: No carotid bruit heard Pulmonary:     Breath sounds: Normal breath sounds.  Abdominal:     General: There is no distension.     Palpations: There is no mass.  Musculoskeletal:        General: No swelling.  Skin:    General: Skin is warm.     Diabetic Foot Exam - Simple   Simple Foot Form Diabetic Foot exam was performed with the following findings:  Yes 11/22/2018  9:15 AM  Visual Inspection No deformities, no ulcerations, no other skin breakdown bilaterally:  Yes Sensation Testing Intact to touch and monofilament testing bilaterally:  Yes Pulse Check Posterior Tibialis and Dorsalis pulse intact bilaterally:  Yes Comments    Vibration sense mildly reduced in distal toes No pedal edema  ASSESSMENT/ PLAN:    Diabetes type 2 with  obesity:   Blood glucose control has been poor and progressively worsening over the last few months Last A1c was 10.7 Current management has been Janumet and Amaryl for quite some time  Currently blood sugars are persistently high both fasting and postprandial  This is likely be related to progression of her diabetes along with continued obesity Although her diet is fairly good usually she is not exercising enough  She appears to be failing treatment with her regimen of Janumet and Amaryl Although her blood sugars are averaging over 270 she is not very symptomatic She is very reluctant to start insulin  Discussed etiology of type 2 diabetes, insulin deficiency, factors causing hyperglycemia, options for treatment and goals for blood sugars and A1c   Recommendations: Discussed action of SGLT 2 drugs on lowering glucose by decreasing kidney absorption of glucose, benefits of weight loss and lower blood pressure, possible side  effects including candidiasis and dosage regimen   She will started [place] 5 mg daily which is preferred drug on her insurance and consider increasing the dose subsequently  While taking this she did not need to take her HCTZ which she takes as needed and also reduce her lisinopril to half a tablet  Encouraged her to increase fluid intake while starting this Discussed with the patient the nature of GLP-1 drugs, the actions on various organ systems, how they benefit blood glucose control, as well as the benefit of weight loss and  increase satiety . Explained possible side effects especially nausea and vomiting initially; discussed safety information in package insert.   Have explained the dosage routine and timing of taking RYBELSUS every morning before breakfast She will start with 3 mg daily before breakfast and after 2 weeks if no nausea increase the dosage to 2 tablets She will subsequently be given a prescription for 7 mg tablet if tolerated  She will increase  her exercise as able to  Once her blood sugars are below 150 she will reduce her Amaryl to 4 mg, half tablet twice daily  Follow-up in 3 weeks  Patient Instructions  Check blood sugars on waking up 3-4  days a week  Also check blood sugars about 2 hours after meals and do this after different meals by rotation  Recommended blood sugar levels on waking up are 90-130 and about 2 hours after meal is 130-160  Please bring your blood sugar monitor to each visit, thank you  Rybelsus 1 tablet of mg daily for the first 2 weeks before breakfast and then if no nausea or significant bowel changes go up to 2 tablets.  Prescription will be for 7 mg daily  You may feel abdominal fullness or mild nausea initially and need to cut back on portions to reduce this sensation Please read the brochure given for details  FARXIGA: May increase urination initially I need to keep up with the fluid intake Reduce lisinopril in half while starting this  When blood sugar is below 150 reduce the glimepiride to half tablet twice a day        Counseling time on subjects discussed in assessment and plan sections is over 50% of today's 40 minute visit   Elayne Snare 11/22/2018, 11:38 AM

## 2018-11-21 NOTE — Telephone Encounter (Signed)
Called patient to find next available date that matched the patients schedule. Inform Judeth Cornfield to override Dr.Kumars schedule.

## 2018-11-22 ENCOUNTER — Other Ambulatory Visit: Payer: Self-pay

## 2018-11-22 ENCOUNTER — Encounter: Payer: Self-pay | Admitting: Endocrinology

## 2018-11-22 ENCOUNTER — Ambulatory Visit: Payer: BC Managed Care – PPO | Admitting: Endocrinology

## 2018-11-22 ENCOUNTER — Encounter

## 2018-11-22 VITALS — BP 126/74 | HR 86 | Ht 64.5 in | Wt 213.0 lb

## 2018-11-22 DIAGNOSIS — E1165 Type 2 diabetes mellitus with hyperglycemia: Secondary | ICD-10-CM

## 2018-11-22 DIAGNOSIS — Z6836 Body mass index (BMI) 36.0-36.9, adult: Secondary | ICD-10-CM | POA: Diagnosis not present

## 2018-11-22 MED ORDER — DAPAGLIFLOZIN PROPANEDIOL 5 MG PO TABS: 5.0000 mg | ORAL_TABLET | Freq: Every day | ORAL | 3 refills | Status: DC

## 2018-11-22 NOTE — Patient Instructions (Addendum)
Check blood sugars on waking up 3-4  days a week  Also check blood sugars about 2 hours after meals and do this after different meals by rotation  Recommended blood sugar levels on waking up are 90-130 and about 2 hours after meal is 130-160  Please bring your blood sugar monitor to each visit, thank you  Rybelsus 1 tablet of mg daily for the first 2 weeks before breakfast and then if no nausea or significant bowel changes go up to 2 tablets.  Prescription will be for 7 mg daily  You may feel abdominal fullness or mild nausea initially and need to cut back on portions to reduce this sensation Please read the brochure given for details  FARXIGA: May increase urination initially I need to keep up with the fluid intake Reduce lisinopril in half while starting this  When blood sugar is below 150 reduce the glimepiride to half tablet twice a day

## 2018-12-09 ENCOUNTER — Telehealth: Payer: Self-pay | Admitting: Endocrinology

## 2018-12-09 NOTE — Telephone Encounter (Signed)
error 

## 2018-12-09 NOTE — Telephone Encounter (Signed)
Patient called stating that she has questions about the most recent medication given to her by Dr Lucianne Muss. Please contact

## 2018-12-10 ENCOUNTER — Other Ambulatory Visit: Payer: Self-pay

## 2018-12-10 MED ORDER — SEMAGLUTIDE 7 MG PO TABS: 7.0000 mg | ORAL_TABLET | Freq: Every day | ORAL | 1 refills | Status: DC

## 2018-12-10 NOTE — Telephone Encounter (Signed)
Pt called and stated that she was needing a refill on the Rybelsus, but she needed 7mg  tablets now. Rx sent per last office visit note.

## 2018-12-12 ENCOUNTER — Other Ambulatory Visit: Payer: BC Managed Care – PPO

## 2018-12-16 ENCOUNTER — Ambulatory Visit: Payer: BC Managed Care – PPO | Admitting: Endocrinology

## 2018-12-17 ENCOUNTER — Telehealth: Payer: Self-pay

## 2018-12-17 NOTE — Telephone Encounter (Signed)
PA initiated via [email or web address] for Rybelsus 7mg  tablets. Key: [id number]

## 2018-12-19 NOTE — Telephone Encounter (Signed)
Received fax from [facility] stating that pt was approved for Rybelsus 7mg  tablets from 12/17/2018-12/17/2019

## 2018-12-26 ENCOUNTER — Encounter: Payer: Self-pay | Admitting: Family Medicine

## 2019-01-01 ENCOUNTER — Ambulatory Visit: Payer: BC Managed Care – PPO | Admitting: Endocrinology

## 2019-01-27 ENCOUNTER — Other Ambulatory Visit: Payer: BC Managed Care – PPO

## 2019-01-28 ENCOUNTER — Other Ambulatory Visit: Payer: BC Managed Care – PPO

## 2019-01-28 ENCOUNTER — Ambulatory Visit: Payer: BC Managed Care – PPO | Admitting: Endocrinology

## 2019-01-30 ENCOUNTER — Ambulatory Visit: Payer: BC Managed Care – PPO | Admitting: Endocrinology

## 2019-02-14 ENCOUNTER — Other Ambulatory Visit: Payer: BC Managed Care – PPO

## 2019-02-17 ENCOUNTER — Other Ambulatory Visit: Payer: Self-pay

## 2019-02-17 ENCOUNTER — Other Ambulatory Visit (INDEPENDENT_AMBULATORY_CARE_PROVIDER_SITE_OTHER): Payer: BC Managed Care – PPO

## 2019-02-17 ENCOUNTER — Other Ambulatory Visit: Payer: Self-pay | Admitting: Endocrinology

## 2019-02-17 DIAGNOSIS — E1165 Type 2 diabetes mellitus with hyperglycemia: Secondary | ICD-10-CM | POA: Diagnosis not present

## 2019-02-17 DIAGNOSIS — E890 Postprocedural hypothyroidism: Secondary | ICD-10-CM

## 2019-02-17 LAB — TSH: TSH: 0.53 u[IU]/mL (ref 0.35–4.50)

## 2019-02-17 LAB — BASIC METABOLIC PANEL
BUN: 23 mg/dL (ref 6–23)
CO2: 25 mEq/L (ref 19–32)
Calcium: 9.5 mg/dL (ref 8.4–10.5)
Chloride: 100 mEq/L (ref 96–112)
Creatinine, Ser: 1.16 mg/dL (ref 0.40–1.20)
GFR: 48.76 mL/min — ABNORMAL LOW (ref >60.00)
Glucose, Bld: 262 mg/dL — ABNORMAL HIGH (ref 70–99)
Potassium: 4.6 mEq/L (ref 3.5–5.1)
Sodium: 135 mEq/L (ref 135–145)

## 2019-02-17 LAB — T4, FREE: Free T4: 1.56 ng/dL (ref 0.60–1.60)

## 2019-02-18 ENCOUNTER — Encounter: Payer: Self-pay | Admitting: Endocrinology

## 2019-02-18 ENCOUNTER — Ambulatory Visit (INDEPENDENT_AMBULATORY_CARE_PROVIDER_SITE_OTHER): Payer: BC Managed Care – PPO | Admitting: Endocrinology

## 2019-02-18 DIAGNOSIS — E890 Postprocedural hypothyroidism: Secondary | ICD-10-CM

## 2019-02-18 DIAGNOSIS — E1165 Type 2 diabetes mellitus with hyperglycemia: Secondary | ICD-10-CM | POA: Diagnosis not present

## 2019-02-18 LAB — FRUCTOSAMINE: Fructosamine: 338 umol/L — ABNORMAL HIGH (ref 0–285)

## 2019-02-18 MED ORDER — INSULIN DEGLUDEC 100 UNIT/ML ~~LOC~~ SOPN: 15.0000 [IU] | PEN_INJECTOR | Freq: Every day | SUBCUTANEOUS | 1 refills | Status: DC

## 2019-02-18 MED ORDER — INSULIN PEN NEEDLE 31G X 5 MM MISC: 1 refills | Status: DC

## 2019-02-18 NOTE — Patient Instructions (Signed)
After instructions you will be starting Tyler Aas insulin: This insulin provides blood sugar control for up to 24 hours with 1 injection   Start with 10 units at bedtime daily and increase by 2 units every 3 days until the waking up sugars are under 130. Then continue the same dose.  If blood sugar is under 90 for 2 days in a row, reduce the dose by 2 units.  Note that this insulin does not control the rise of blood sugar with meals

## 2019-02-18 NOTE — Progress Notes (Signed)
Patient ID: Nicole Crane, female   DOB: [birth year], 53 y.o.   MRN: [id number]          Today's office visit was provided via telemedicine using video technique The patient was explained the limitations of evaluation and management by telemedicine and the availability of in person appointments.  The patient understood the limitations and agreed to proceed. Patient also understood that the telehealth visit is billable. . Location of the patient: Patient's home . Location of the provider: Physician office Only the patient and myself were participating in the encounter    Reason for Appointment: Endocrinology follow-up  History of Present Illness    THYROID:  She has postsurgical hypothyroidism, date of surgery 02/26/2018  She has been recommended lower doses of levothyroxine when her TSH was low at previous visits consistently She had a TSH of only 0.13 previously in 07/2018 and she was recommended 112 mcg instead of 137 However she restarted taking 125 mcg on her own  The levothyroxine 125 dose has been continued unchanged  She feels fairly good but sometimes does feel tired No symptoms of shakiness, palpitations or nervousness  She has lost weight for other reasons  Her TSH is still below 1.0 and now 0.53  Lab Results  Component Value Date   TSH 0.53 02/17/2019   TSH 0.85 09/27/2018   TSH 0.13 (L) 07/29/2018   FREET4 1.56 02/17/2019   FREET4 1.39 09/27/2018   FREET4 1.71 (H) 07/29/2018    DIABETES   Diagnosis date:  About 2005  Previous history: She had gestational diabetes in the past Subsequently she developed diabetes which initially was mild and treated with metformin alone In the past her highest weight has been about 300 pounds She thinks her blood sugars are relatively well controlled for a few years with this and subsequently Amaryl added About 4 years ago for better control was given Bydureon which apparently kept her sugars controlled and she had  lost some weight Because of high out-of-pocket expense this was stopped about 2 years ago  Recent history:     Non-insulin hypoglycemic drugs: Amaryl 4 mg twice daily, Janumet 50/1000, twice daily, Farxiga 5 mg daily, Rybelsus 7 mg daily           Her most recent A1c was 10.7 done in 10/2018 by her PCP     Current self management, blood sugar patterns and problems identified:  . She was last seen in 3/20 but did not come back for follow-up after making her medication changes . Previously her blood sugars were averaging nearly 270 and as high as 379 . With starting Wilder Glade and also using Rybelsus he appears to have lost some weight, likely about 8 pounds over the last 3 months . She thinks her blood sugars are relatively higher in the morning and mostly averaging near 200 . During the day her blood sugars may be as low as 120 occasionally . She thinks that she is usually trying to limit her portions . She did not however meter with her and not clear what her blood sugar patterns are and actual average . Previously had been checking blood sugars at different times of the day . She was recommended cutting her Amaryl to half tablet with starting [place] but she has not done so . No side effects of Farxiga like yeast infections or nausea with Rybelsus . She is taking her Rybelsus before breakfast daily consistently  Exercise: Recently not able to do much walking because of  continued ankle pain She is trying to do a little exercise bicycle   Side effects from medications:  Visual disturbance from Victoza Diet management: She is usually cutting back on carbohydrate intake and avoiding all sweetened drinks      Monitors blood glucose: Once a day.    Glucometer: One [employer].           Blood Glucose readings from patient history as above  Previous readings:  PRE-MEAL Fasting Lunch Dinner Bedtime Overall  Glucose range:  204-303   186-338    Mean/median:  230 247  322   269    POST-MEAL PC Breakfast PC Lunch PC Dinner  Glucose range:    186-379  Mean/median:    265                          Dietician visit: Most recent:     At diagnosis  Weight control:  Wt Readings from Last 3 Encounters:  11/22/18 213 lb (96.6 kg)  10/03/18 213 lb 9.6 oz (96.9 kg)  07/31/18 213 lb (96.6 kg)          Complications:     Diabetes labs:  Lab Results  Component Value Date   HGBA1C 7.9 (H) 02/22/2018   HGBA1C (H) 03/16/2010    11.7 (NOTE)                                                                       According to the ADA Clinical Practice Recommendations for 2011, when HbA1c is used as a screening test:   >=6.5%   Diagnostic of Diabetes Mellitus           (if abnormal result  is confirmed)  5.7-6.4%   Increased risk of developing Diabetes Mellitus  References:Diagnosis and Classification of Diabetes Mellitus,Diabetes TIWP,8099,83(JASNK 1):S62-S69 and Standards of Medical Care in         Diabetes - 2011,Diabetes Care,2011,34  (Suppl 1):S11-S61.   Lab Results  Component Value Date   CREATININE 1.16 02/17/2019     Allergies as of 02/18/2019      Reactions   Alcohol Hives, Shortness Of Breath, Swelling   Antihistamines, Diphenhydramine-type Hives   Avelox [moxifloxacin Hcl In Nacl] Other (See Comments)   hallucinations.   Dilaudid [hydromorphone Hcl] Hives, Itching   Erythromycin Base Hives   Kiwi Extract Itching   Lips itch   Levofloxacin Other (See Comments)   Hallucinations.   Morphine And Related Hives, Itching   Naproxen Other (See Comments)   Severe acid reflux--possible ulcers   Other    Anything with alcohol that she has to injest.   Penicillins Swelling, Other (See Comments)   Welts Has patient had a PCN reaction causing immediate rash, facial/tongue/throat swelling, SOB or lightheadedness with hypotension: Yes--lips swelling Has patient had a PCN reaction causing severe rash involving mucus membranes or skin necrosis: No Has patient had a PCN  reaction that required hospitalization: No Has patient had a PCN reaction occurring within the last 10 years: No If all of the above answers are "NO", then may proceed with Cephalosporin use.   Prednisone Other (See Comments)   Heart races with large doses/high dose.   Pregabalin Swelling, Other (  See Comments)   Fluid retention caused weight gained 50 LBS of fluid in ~ 4 days   Statins Other (See Comments)   Muscle/joint aches & pains   Sulfa Antibiotics Nausea And Vomiting   Victoza [liraglutide] Other (See Comments)   Caused visual disturbances (visual impairments)      Medication List       Accurate as of February 18, 2019  2:20 PM. If you have any questions, ask your nurse or doctor.        albuterol 108 (90 Base) MCG/ACT inhaler Commonly known as:  VENTOLIN HFA Inhale 1-2 puffs into the lungs every 6 (six) hours as needed for wheezing or shortness of breath.   ALPRAZolam 0.5 MG tablet Commonly known as:  XANAX Take 0.5 mg by mouth at bedtime.   atorvastatin 40 MG tablet Commonly known as:  LIPITOR Take 40 mg by mouth every evening.   Farxiga 5 MG Tabs tablet Generic drug:  dapagliflozin propanediol TAKE ONE TABLET BY MOUTH DAILY   glimepiride 4 MG tablet Commonly known as:  AMARYL Take 4 mg by mouth 2 (two) times daily.   hydrochlorothiazide 25 MG tablet Commonly known as:  HYDRODIURIL Take 25 mg by mouth every morning.   ibuprofen 800 MG tablet Commonly known as:  ADVIL Take 800 mg by mouth at bedtime.   insulin degludec 100 UNIT/ML Sopn FlexTouch Pen Commonly known as:  Tyler Aas FlexTouch Inject 0.15 mLs (15 Units total) into the skin daily. To start after appointment with diabetes educator Started by:  Elayne Snare, MD   Insulin Pen Needle 31G X 5 MM Misc Use with Tyler Aas pen once a day Started by:  Elayne Snare, MD   levothyroxine 125 MCG tablet Commonly known as:  SYNTHROID Take 1 tablet (125 mcg total) by mouth daily before breakfast. TAKE 1 TABLET BY  MOUTH ONCE DAILY.   lisinopril 20 MG tablet Commonly known as:  ZESTRIL Take 20 mg by mouth daily.   pantoprazole 40 MG tablet Commonly known as:  PROTONIX Take 40 mg by mouth every evening.   Semaglutide 7 MG Tabs Commonly known as:  Rybelsus Take 7 mg by mouth daily. Take 1 tablet by mouth once daily.   sitaGLIPtin-metformin 50-1000 MG tablet Commonly known as:  JANUMET Take 1 tablet by mouth 2 (two) times daily.   traMADol 50 MG tablet Commonly known as:  ULTRAM Take 1-2 tablets (50-100 mg total) by mouth every 6 (six) hours as needed for moderate pain (mild pain not relieved wtih acetaminophen).       Allergies:  Allergies  Allergen Reactions  . Alcohol Hives, Shortness Of Breath and Swelling  . Antihistamines, Diphenhydramine-Type Hives  . Avelox [Moxifloxacin Hcl In Nacl] Other (See Comments)    hallucinations.  . Dilaudid [Hydromorphone Hcl] Hives and Itching  . Erythromycin Base Hives  . Kiwi Extract Itching    Lips itch  . Levofloxacin Other (See Comments)    Hallucinations.  . Morphine And Related Hives and Itching  . Naproxen Other (See Comments)    Severe acid reflux--possible ulcers  . Other     Anything with alcohol that she has to injest.  . Penicillins Swelling and Other (See Comments)    Welts Has patient had a PCN reaction causing immediate rash, facial/tongue/throat swelling, SOB or lightheadedness with hypotension: Yes--lips swelling Has patient had a PCN reaction causing severe rash involving mucus membranes or skin necrosis: No Has patient had a PCN reaction that required hospitalization: No Has patient  had a PCN reaction occurring within the last 10 years: No If all of the above answers are "NO", then may proceed with Cephalosporin use.   . Prednisone Other (See Comments)    Heart races with large doses/high dose.  . Pregabalin Swelling and Other (See Comments)    Fluid retention caused weight gained 50 LBS of fluid in ~ 4 days  . Statins  Other (See Comments)    Muscle/joint aches & pains  . Sulfa Antibiotics Nausea And Vomiting  . Victoza [Liraglutide] Other (See Comments)    Caused visual disturbances (visual impairments)    Past Medical History:  Diagnosis Date  . Allergic rhinitis   . Anxiety   . Bilateral leg edema   . DDD (degenerative disc disease), lumbosacral   . Diabetes mellitus without complication ([place])   . GERD (gastroesophageal reflux disease)   . High cholesterol   . Hx of blood clots    post op in abdomen after BSO  . Hypertension   . Hyperthyroidism   . Multiple thyroid nodules   . OA (osteoarthritis) of shoulder   . Spinal stenosis   . Tick bite 02/14/2018   [place] tick    Past Surgical History:  Procedure Laterality Date  . ABDOMINAL HYSTERECTOMY     complete  . BACK SURGERY    . BONE MARROW BIOPSY     after BSO to check clotting  . CHOLECYSTECTOMY    . DILATION AND CURETTAGE OF UTERUS    . ENDOMETRIAL ABLATION    . EXPLORATORY LAPAROTOMY     BSO  . MOUTH SURGERY    . ROTATOR CUFF REPAIR    . THYROIDECTOMY N/A 02/26/2018   Procedure: TOTAL THYROIDECTOMY;  Surgeon: Armandina Gemma, MD;  Location: [facility];  Service: General;  Laterality: N/A;    Family History  Problem Relation Age of Onset  . Diabetes Brother   . Thyroid disease Neg Hx     Social History:  reports that she has never smoked. She has never used smokeless tobacco. She reports that she does not drink alcohol or use drugs.  Review of Systems:  Hypertension:  On treatment for at least 2 years from her PCP  Lipids: Has been on atorvastatin 40 mg for quite some time followed by PCP Her last LDL in 2/20 was 91   HYPOTHYROIDISM: See notes from 10/03/2018  He does not have any symptoms of numbness, tingling or burning in her feet  Has regular eye exams including in 2019 which did not show any retinopathy  No pain in the calf muscles on walking  Previous weight range 196-300 pounds    LABS:  Serum creatinine  was 1.03 done in 2/20 outside lab Urine microalbumin ratio 25 also done in 2/20  Lab on 02/17/2019  Component Date Value Ref Range Status  . Fructosamine 02/17/2019 338* 0 - 285 umol/L Final   Comment: Published reference interval for apparently healthy subjects between age 53 and 63 is 54 - 285 umol/L and in a poorly controlled diabetic population is 228 - 563 umol/L with a mean of 396 umol/L.   Marland Kitchen Sodium 02/17/2019 135  135 - 145 mEq/L Final  . Potassium 02/17/2019 4.6  3.5 - 5.1 mEq/L Final  . Chloride 02/17/2019 100  96 - 112 mEq/L Final  . CO2 02/17/2019 25  19 - 32 mEq/L Final  . Glucose, Bld 02/17/2019 262* 70 - 99 mg/dL Final  . BUN 02/17/2019 23  6 - 23 mg/dL  Final  . Creatinine, Ser 02/17/2019 1.16  0.40 - 1.20 mg/dL Final  . Calcium 02/17/2019 9.5  8.4 - 10.5 mg/dL Final  . GFR 02/17/2019 48.76* >60.00 mL/min Final  . Free T4 02/17/2019 1.56  0.60 - 1.60 ng/dL Final   Comment: Specimens from patients who are undergoing biotin therapy and /or ingesting biotin supplements may contain high levels of biotin.  The higher biotin concentration in these specimens interferes with this Free T4 assay.  Specimens that contain high levels  of biotin may cause false high results for this Free T4 assay.  Please interpret results in light of the total clinical presentation of the patient.    Marland Kitchen TSH 02/17/2019 0.53  0.35 - 4.50 uIU/mL Final     Examination:   There were no vitals taken for this visit.  There is no height or weight on file to calculate BMI.    Physical Exam   ASSESSMENT/ PLAN:    Diabetes type 2 with obesity:   Last A1c was 10.7 Although her sugars are somewhat better with adding Farxiga and Rybelsus  Currently blood sugars are persistently high both fasting and postprandial  This is likely be related to progression of her diabetes along with continued obesity Although her diet is fairly good usually she is not exercising enough  She appears to be failing  treatment with her regimen of Janumet and Amaryl Although her blood sugars are averaging over 270 she is not very symptomatic She is very reluctant to start insulin  Discussed etiology of type 2 diabetes, insulin deficiency, factors causing hyperglycemia, options for treatment and goals for blood sugars and A1c   Recommendations:  Increase Farxiga to 10 mg, may use 2 tablets of 5 mg meanwhile  Change to Xigduo combination with [place] and metformin once Janumet is finished which will not be needed  Stop glimepiride in the morning  Start basal insulin after instructions from nurse educator  She will use Tresiba once a day starting at 10 units  She needs to be instructed on adjusting the dose every 3 days x 2 units until morning sugars are below 130  Increase walking as tolerated  More blood sugars after meals and target of under 180  She will need to bring her monitor for download on the next visit  HYPOTHYROIDISM continue same dose of 125 mcg but let us know if she has any symptoms of shakiness, palpitations or jitteriness Check thyroid level again in about 4 to 6 months  Patient Instructions  After instructions you will be starting Tresiba insulin: This insulin provides blood sugar control for up to 24 hours with 1 injection   Start with 10 units at bedtime daily and increase by 2 units every 3 days until the waking up sugars are under 130. Then continue the same dose.  If blood sugar is under 90 for 2 days in a row, reduce the dose by 2 units.  Note that this insulin does not control the rise of blood sugar with meals     Total visit time for evaluation and management of multiple problems and counseling =25 minutes   Elayne Snare 02/18/2019, 2:20 PM

## 2019-03-19 ENCOUNTER — Encounter: Payer: BC Managed Care – PPO | Attending: Endocrinology | Admitting: Nutrition

## 2019-03-19 ENCOUNTER — Other Ambulatory Visit: Payer: Self-pay

## 2019-03-19 DIAGNOSIS — E1165 Type 2 diabetes mellitus with hyperglycemia: Secondary | ICD-10-CM | POA: Insufficient documentation

## 2019-03-21 ENCOUNTER — Other Ambulatory Visit: Payer: Self-pay

## 2019-03-21 ENCOUNTER — Telehealth: Payer: Self-pay | Admitting: Endocrinology

## 2019-03-21 ENCOUNTER — Other Ambulatory Visit: Payer: BC Managed Care – PPO

## 2019-03-21 NOTE — Telephone Encounter (Signed)
Patient has called in regards to her appointment on July 8th. States that Vaughan Basta was supposed to fax over a letter to her work for her to join a gym and also send over a refill for her [place]. States none of this has been done.  Please Advise, Thanks

## 2019-03-24 ENCOUNTER — Other Ambulatory Visit: Payer: Self-pay | Admitting: Endocrinology

## 2019-03-24 ENCOUNTER — Encounter: Payer: Self-pay | Admitting: Endocrinology

## 2019-03-24 NOTE — Telephone Encounter (Signed)
Faxed to number provided by patient

## 2019-03-24 NOTE — Progress Notes (Signed)
Patient has reported that she has been taking the insulin for the last 2 weeks.  She is taking it in the morning, because she is "afraid to take it before bedtime.  She has not adjusted the insulin dose as directed.  She did not bring in her meter, but says blood sugars vary depending on HS eating.  She is a "late night" person eating supper late-9PM-10 PM and stays up unitll 1-2AM.  She gets up at 11AM, and takes her insulin then.  She is testing her blood sugar in the morning, about  1/2 the time. Discussed how the insulin works and the fact that we are relying on her own body's insulin to cover the blood sugar rise she gets after eating.  Therefor, her meals with the amounts of carbs and fats are very important.  Discussed ways to make her body's insulin to work better (waist lost size, decreased body fat, balancing meals and exercise.  Says she is not exercising, because she prefers to go to the gym, where she is better motivated to do this.   Meals are balanced, but higher in fat.  Discussed ways to reduce the fat.   Discussed the importance of testing blood sugars, --to determine the correct insulin dose, and when it is time to increase/decrease insulin amounts  She reported good understanding of this.   Also suggested she pick one meal, and test ac and 2hr. pc that meal to see if her insulin is covering that meal.  She agreed to do this.

## 2019-03-24 NOTE — Patient Instructions (Addendum)
Test blood sugar fasting and 2hr. After one meal each day Goals for FBSs:  Less than 130.  If Blood sugar is above 130 for 3 days, increase dose of Tresiba by 2u.   Exercise for 30-40 min. 5 days/wk. Call if questions

## 2019-03-25 ENCOUNTER — Ambulatory Visit: Payer: BC Managed Care – PPO | Admitting: Endocrinology

## 2019-03-25 NOTE — Telephone Encounter (Signed)
Letter has been given to front desk

## 2019-04-16 ENCOUNTER — Other Ambulatory Visit: Payer: Self-pay

## 2019-04-16 ENCOUNTER — Telehealth: Payer: Self-pay

## 2019-04-16 MED ORDER — FREESTYLE LIBRE 14 DAY SENSOR MISC: 1.0000 | 3 refills | Status: DC

## 2019-04-16 MED ORDER — INSULIN PEN NEEDLE 31G X 5 MM MISC: 1 refills | Status: DC

## 2019-04-16 MED ORDER — FREESTYLE LIBRE 14 DAY READER DEVI: 1.0000 | 1 refills | Status: DC

## 2019-04-16 NOTE — Telephone Encounter (Signed)
We can send a prescription to the pharmacy

## 2019-04-16 NOTE — Telephone Encounter (Signed)
Pt called and stated that you had spoken to her regarding the [facility] but it was never prescribed or mentioned in your last office note. Would you like pt to have it?

## 2019-04-16 NOTE — Telephone Encounter (Signed)
Rx sent 

## 2019-04-18 ENCOUNTER — Other Ambulatory Visit (INDEPENDENT_AMBULATORY_CARE_PROVIDER_SITE_OTHER): Payer: BC Managed Care – PPO

## 2019-04-18 ENCOUNTER — Other Ambulatory Visit: Payer: Self-pay

## 2019-04-18 DIAGNOSIS — E1165 Type 2 diabetes mellitus with hyperglycemia: Secondary | ICD-10-CM | POA: Diagnosis not present

## 2019-04-18 LAB — COMPREHENSIVE METABOLIC PANEL
ALT: 18 U/L (ref 0–35)
AST: 13 U/L (ref 0–37)
Albumin: 4.3 g/dL (ref 3.5–5.2)
Alkaline Phosphatase: 104 U/L (ref 39–117)
BUN: 16 mg/dL (ref 6–23)
CO2: 27 mEq/L (ref 19–32)
Calcium: 9.3 mg/dL (ref 8.4–10.5)
Chloride: 101 mEq/L (ref 96–112)
Creatinine, Ser: 1.03 mg/dL (ref 0.40–1.20)
GFR: 55.89 mL/min — ABNORMAL LOW (ref >60.00)
Glucose, Bld: 160 mg/dL — ABNORMAL HIGH (ref 70–99)
Potassium: 4.7 mEq/L (ref 3.5–5.1)
Sodium: 136 mEq/L (ref 135–145)
Total Bilirubin: 0.4 mg/dL (ref 0.2–1.2)
Total Protein: 7 g/dL (ref 6.0–8.3)

## 2019-04-18 LAB — HEMOGLOBIN A1C: Hgb A1c MFr Bld: 9.2 % — ABNORMAL HIGH (ref 4.6–6.5)

## 2019-04-21 ENCOUNTER — Encounter: Payer: Self-pay | Admitting: Endocrinology

## 2019-04-22 ENCOUNTER — Ambulatory Visit (INDEPENDENT_AMBULATORY_CARE_PROVIDER_SITE_OTHER): Payer: BC Managed Care – PPO | Admitting: Endocrinology

## 2019-04-22 ENCOUNTER — Other Ambulatory Visit: Payer: Self-pay

## 2019-04-22 DIAGNOSIS — Z6836 Body mass index (BMI) 36.0-36.9, adult: Secondary | ICD-10-CM | POA: Diagnosis not present

## 2019-04-22 DIAGNOSIS — E890 Postprocedural hypothyroidism: Secondary | ICD-10-CM | POA: Diagnosis not present

## 2019-04-22 DIAGNOSIS — E1165 Type 2 diabetes mellitus with hyperglycemia: Secondary | ICD-10-CM

## 2019-04-22 MED ORDER — XIGDUO XR 5-1000 MG PO TB24: 2.0000 | ORAL_TABLET | Freq: Every day | ORAL | 2 refills | Status: DC

## 2019-04-22 MED ORDER — NOVOLOG FLEXPEN 100 UNIT/ML ~~LOC~~ SOPN: PEN_INJECTOR | SUBCUTANEOUS | 1 refills | Status: DC

## 2019-04-22 NOTE — Progress Notes (Signed)
Patient ID: Nicole Crane, female   DOB: [birth year], 53 y.o.   MRN: [id number]          Today's office visit was provided via telemedicine using video technique The patient was explained the limitations of evaluation and management by telemedicine and the availability of in person appointments.  The patient understood the limitations and agreed to proceed. Patient also understood that the telehealth visit is billable.  Location of the patient: Patient's home  Location of the provider: Physician office Only the patient and myself were participating in the encounter    Reason for Appointment: Endocrinology follow-up  History of Present Illness     DIABETES type II   Diagnosis date:  About 2005  Previous history: She had gestational diabetes in the past Subsequently she developed diabetes which initially was mild and treated with metformin alone In the past her highest weight has been about 300 pounds She thinks her blood sugars are relatively well controlled for a few years with this and subsequently Amaryl added About 4 years ago for better control was given Bydureon which apparently kept her sugars controlled and she had lost some weight Because of high out-of-pocket expense this was stopped about 2 years ago  Recent history:     Non-insulin hypoglycemic drugs: Amaryl 4 mg twice daily, Janumet 50/1000, twice daily, Farxiga 5 mg daily, Rybelsus 7 mg daily           INSULIN regimen: Tresiba U-100 12 units once daily     Current self management, blood sugar patterns and problems identified:   Her A1c is now 9.2%  She was advised to start insulin in June but she did not get started with this until about a month ago when she was finally seen by the diabetes educator  She was started on 10 units and she has gone up to 12 units, however she is taking 10 units when the blood sugar is below 90 which is only rare  She thinks that right after starting the insulin she  initially had some low sugars during the night on 2 occasions but not for at least 2 weeks now  However despite continuing her Farxiga at 10 mg and Rybelsus her postprandial readings are fairly consistently high and only once her bedtime reading was better at 138  Has no difficulty doing the insulin injection but she takes it at lunchtime  She is usually moderating her carbohydrates and portions with eating low-fat yogurt in the morning, sometimes will have a soup at lunch  She also thinks she is losing weight with her recent weight down to 204  She is starting to walk and do an exercise bicycle, previously had problems with continued ankle pain  She is taking her Rybelsus before breakfast daily without any nausea   Side effects from medications:  Visual disturbance from Victoza Diet management: She is usually cutting back on carbohydrate intake and avoiding all sweetened drinks      Monitors blood glucose: Once a day.    Glucometer: One [employer].           Blood Glucose readings from monitor upload data   PRE-MEAL Fasting Lunch Dinner Bedtime Overall  Glucose range:  90-166  199-344  188   90-344  Mean/median:  138     172   POST-MEAL PC Breakfast PC Lunch PC Dinner  Glucose range:    138-239  Mean/median:    229   PREVIOUS readings:  PRE-MEAL Fasting Lunch Dinner  Bedtime Overall  Glucose range:  204-303   186-338    Mean/median:  230 247  322   269   POST-MEAL PC Breakfast PC Lunch PC Dinner  Glucose range:    186-379  Mean/median:    265                          Dietician visit: Most recent:     At diagnosis  Weight control:  Wt Readings from Last 3 Encounters:  11/22/18 213 lb (96.6 kg)  10/03/18 213 lb 9.6 oz (96.9 kg)  07/31/18 213 lb (96.6 kg)          Complications:     Diabetes labs:  Lab Results  Component Value Date   HGBA1C 9.2 (H) 04/18/2019   HGBA1C 7.9 (H) 02/22/2018   HGBA1C (H) 03/16/2010    11.7 (NOTE)                                                                        According to the ADA Clinical Practice Recommendations for 2011, when HbA1c is used as a screening test:   >=6.5%   Diagnostic of Diabetes Mellitus           (if abnormal result  is confirmed)  5.7-6.4%   Increased risk of developing Diabetes Mellitus  References:Diagnosis and Classification of Diabetes Mellitus,Diabetes NKNL,9767,34(LPFXT 1):S62-S69 and Standards of Medical Care in         Diabetes - 2011,Diabetes Care,2011,34  (Suppl 1):S11-S61.   Lab Results  Component Value Date   CREATININE 1.03 04/18/2019    Other problems including hypothyroidism: See review of systems   Allergies as of 04/22/2019      Reactions   Alcohol Hives, Shortness Of Breath, Swelling   Antihistamines, Diphenhydramine-type Hives   Avelox [moxifloxacin Hcl In Nacl] Other (See Comments)   hallucinations.   Dilaudid [hydromorphone Hcl] Hives, Itching   Erythromycin Base Hives   Kiwi Extract Itching   Lips itch   Levofloxacin Other (See Comments)   Hallucinations.   Morphine And Related Hives, Itching   Naproxen Other (See Comments)   Severe acid reflux--possible ulcers   Other    Anything with alcohol that she has to injest.   Penicillins Swelling, Other (See Comments)   Welts Has patient had a PCN reaction causing immediate rash, facial/tongue/throat swelling, SOB or lightheadedness with hypotension: Yes--lips swelling Has patient had a PCN reaction causing severe rash involving mucus membranes or skin necrosis: No Has patient had a PCN reaction that required hospitalization: No Has patient had a PCN reaction occurring within the last 10 years: No If all of the above answers are "NO", then may proceed with Cephalosporin use.   Prednisone Other (See Comments)   Heart races with large doses/high dose.   Pregabalin Swelling, Other (See Comments)   Fluid retention caused weight gained 50 LBS of fluid in ~ 4 days   Statins Other (See Comments)   Muscle/joint aches &  pains   Sulfa Antibiotics Nausea And Vomiting   Victoza [liraglutide] Other (See Comments)   Caused visual disturbances (visual impairments)      Medication List       Accurate as of April 22, 2019  9:19 AM. If you have any questions, ask your nurse or doctor.        STOP taking these medications   hydrochlorothiazide 25 MG tablet Commonly known as: HYDRODIURIL Stopped by: Elayne Snare, MD   traMADol 50 MG tablet Commonly known as: ULTRAM Stopped by: Elayne Snare, MD     TAKE these medications   albuterol 108 (90 Base) MCG/ACT inhaler Commonly known as: VENTOLIN HFA Inhale 1-2 puffs into the lungs every 6 (six) hours as needed for wheezing or shortness of breath.   ALPRAZolam 0.5 MG tablet Commonly known as: XANAX Take 0.5 mg by mouth at bedtime.   atorvastatin 40 MG tablet Commonly known as: LIPITOR Take 40 mg by mouth every evening.   Farxiga 5 MG Tabs tablet Generic drug: dapagliflozin propanediol TAKE ONE TABLET BY MOUTH DAILY What changed:   how much to take  when to take this   FreeStyle Libre 14 Day Reader Kerrin Mo 1 each by Does not apply route See admin instructions. Use Freestyle Libre reader to monitor blood glucose levels continuously.   FreeStyle Libre 14 Day Sensor Misc 1 each by Does not apply route every 14 (fourteen) days. Apply 1 sensor to body once every 14 days to monitor blood sugar levels.   glimepiride 4 MG tablet Commonly known as: AMARYL Take 4 mg by mouth daily.   ibuprofen 800 MG tablet Commonly known as: ADVIL Take 800 mg by mouth at bedtime.   insulin degludec 100 UNIT/ML Sopn FlexTouch Pen Commonly known as: Tyler Aas FlexTouch Inject 0.15 mLs (15 Units total) into the skin daily. To start after appointment with diabetes educator What changed:   how much to take  additional instructions   Insulin Pen Needle 31G X 5 MM Misc Use with Tresiba pen once a day   levothyroxine 125 MCG tablet Commonly known as: SYNTHROID Take 1  tablet (125 mcg total) by mouth daily before breakfast. TAKE 1 TABLET BY MOUTH ONCE DAILY.   lisinopril 20 MG tablet Commonly known as: ZESTRIL Take 20 mg by mouth daily.   pantoprazole 40 MG tablet Commonly known as: PROTONIX Take 40 mg by mouth every evening.   Rybelsus 7 MG Tabs Generic drug: Semaglutide TAKE ONE TABLET BY MOUTH ONCE DAILY   sitaGLIPtin-metformin 50-1000 MG tablet Commonly known as: JANUMET Take 2 tablets by mouth daily.       Allergies:  Allergies  Allergen Reactions   Alcohol Hives, Shortness Of Breath and Swelling   Antihistamines, Diphenhydramine-Type Hives   Avelox [Moxifloxacin Hcl In Nacl] Other (See Comments)    hallucinations.   Dilaudid [Hydromorphone Hcl] Hives and Itching   Erythromycin Base Hives   Kiwi Extract Itching    Lips itch   Levofloxacin Other (See Comments)    Hallucinations.   Morphine And Related Hives and Itching   Naproxen Other (See Comments)    Severe acid reflux--possible ulcers   Other     Anything with alcohol that she has to injest.   Penicillins Swelling and Other (See Comments)    Welts Has patient had a PCN reaction causing immediate rash, facial/tongue/throat swelling, SOB or lightheadedness with hypotension: Yes--lips swelling Has patient had a PCN reaction causing severe rash involving mucus membranes or skin necrosis: No Has patient had a PCN reaction that required hospitalization: No Has patient had a PCN reaction occurring within the last 10 years: No If all of the above answers are "NO", then may proceed with Cephalosporin use.  Prednisone Other (See Comments)    Heart races with large doses/high dose.   Pregabalin Swelling and Other (See Comments)    Fluid retention caused weight gained 50 LBS of fluid in ~ 4 days   Statins Other (See Comments)    Muscle/joint aches & pains   Sulfa Antibiotics Nausea And Vomiting   Victoza [Liraglutide] Other (See Comments)    Caused visual  disturbances (visual impairments)    Past Medical History:  Diagnosis Date   Allergic rhinitis    Anxiety    Bilateral leg edema    DDD (degenerative disc disease), lumbosacral    Diabetes mellitus without complication (HCC)    GERD (gastroesophageal reflux disease)    High cholesterol    Hx of blood clots    post op in abdomen after BSO   Hypertension    Hyperthyroidism    Multiple thyroid nodules    OA (osteoarthritis) of shoulder    Spinal stenosis    Tick bite 02/14/2018   [place] tick    Past Surgical History:  Procedure Laterality Date   ABDOMINAL HYSTERECTOMY     complete   BACK SURGERY     BONE MARROW BIOPSY     after BSO to check clotting   CHOLECYSTECTOMY     DILATION AND CURETTAGE OF UTERUS     ENDOMETRIAL ABLATION     EXPLORATORY LAPAROTOMY     BSO   MOUTH SURGERY     ROTATOR CUFF REPAIR     THYROIDECTOMY N/A 02/26/2018   Procedure: TOTAL THYROIDECTOMY;  Surgeon: Armandina Gemma, MD;  Location: [facility];  Service: General;  Laterality: N/A;    Family History  Problem Relation Age of Onset   Diabetes Brother    Thyroid disease Neg Hx     Social History:  reports that she has never smoked. She has never used smokeless tobacco. She reports that she does not drink alcohol or use drugs.  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  She has been recommended lower doses of levothyroxine when her TSH was low at previous visits consistently She had a TSH of only 0.13 previously in 07/2018 and she was recommended 112 mcg instead of 137 However she restarted taking 125 mcg on her own  The levothyroxine 125 dose has been continued unchanged  She feels fairly good but sometimes does feel tired No symptoms of shakiness, palpitations or nervousness  She has lost weight recently  Lab Results  Component Value Date   TSH 0.53 02/17/2019   TSH 0.85 09/27/2018   TSH 0.13 (L)  07/29/2018   FREET4 1.56 02/17/2019   FREET4 1.39 09/27/2018   FREET4 1.71 (H) 07/29/2018     Hypertension:  On treatment for at least 2 years from her PCP  Lipids: Has been on atorvastatin 40 mg for quite some time followed by PCP Her last LDL in 2/20 was 91   He does not have any symptoms of numbness, tingling or burning in her feet  Has regular eye exams including in 2019 which did not show any retinopathy     LABS:    Urine microalbumin ratio 25 also done in 2/20  Lab on 04/18/2019  Component Date Value Ref Range Status   Sodium 04/18/2019 136  135 - 145 mEq/L Final   Potassium 04/18/2019 4.7  3.5 - 5.1 mEq/L Final   Chloride 04/18/2019 101  96 - 112 mEq/L Final   CO2 04/18/2019 27  19 - 32 mEq/L Final   Glucose, Bld 04/18/2019 160* 70 - 99 mg/dL Final   BUN 04/18/2019 16  6 - 23 mg/dL Final   Creatinine, Ser 04/18/2019 1.03  0.40 - 1.20 mg/dL Final   Total Bilirubin 04/18/2019 0.4  0.2 - 1.2 mg/dL Final   Alkaline Phosphatase 04/18/2019 104  39 - 117 U/L Final   AST 04/18/2019 13  0 - 37 U/L Final   ALT 04/18/2019 18  0 - 35 U/L Final   Total Protein 04/18/2019 7.0  6.0 - 8.3 g/dL Final   Albumin 04/18/2019 4.3  3.5 - 5.2 g/dL Final   Calcium 04/18/2019 9.3  8.4 - 10.5 mg/dL Final   GFR 04/18/2019 55.89* >60.00 mL/min Final   Hgb A1c MFr Bld 04/18/2019 9.2* 4.6 - 6.5 % Final   Glycemic Control Guidelines for People with Diabetes:Non Diabetic:  <6%Goal of Therapy: <7%Additional Action Suggested:  >8%      Examination:   There were no vitals taken for this visit.  There is no height or weight on file to calculate BMI.    Physical Exam   ASSESSMENT/ PLAN:    Diabetes type 2 with obesity:   Her A1c is 9.2  She is clearly insulin deficient with failure of multiple drugs Now with starting basal insulin her fasting readings are improving and recently averaging about 135 She has required relatively low doses of Tresiba and currently taking 12  units  However her postprandial readings have been as high as 344 despite relatively better diet and avoiding excess carbohydrate intake She is also losing weight with continuing Farxiga 10 mg and Rybelsus 7 mg  Discussed that she does have insulin deficiency and needs mealtime insulin also in combination with her basal insulin   Recommendations:  Today discussed in detail the need for mealtime insulin to cover postprandial spikes, action of mealtime insulin, use of the insulin pen, timing and action of the rapid acting insulin as well as starting dose and dosage titration to target the two-hour reading of under 180  She can start with 3 to 5 units of insulin based on her meal size and carbohydrate content  She will need to check readings after meals very consistently  She will continue 12 units of Tresiba and not change it unless her blood sugars are consistently below 90 or above 130, but advised her not to change the dose every day  Increase Farxiga to 10 mg, may use 2 tablets of 5 mg meanwhile  Change to Xigduo combination with [place] and metformin and stop Janumet  Continue efforts to lose weight  Stop glimepiride daily completely  Discussed use of the freestyle libre sensor that she is going to use and how this will be helpful with her monitoring her postprandial readings as well as overnight patterns for insulin adjustment and effects of various foods on her diet  HYPOTHYROIDISM continue same dose of 125 mcg and follow-up TSH on the next visit   There are no Patient Instructions on file for this visit.  Counseling time on subjects discussed in assessment and plan sections is over 50% of today's 25 minute visit    Elayne Snare 04/22/2019, 9:19 AM

## 2019-04-23 ENCOUNTER — Telehealth: Payer: Self-pay | Admitting: Endocrinology

## 2019-04-23 NOTE — Telephone Encounter (Signed)
Ok to give samples

## 2019-04-23 NOTE — Telephone Encounter (Signed)
She needs to see if Insurance will cover generic Lispro, or we can give her copay card for Novolog. This is going to a long term need

## 2019-04-23 NOTE — Telephone Encounter (Signed)
Patient has called stating that Dr.Kumar wanted her to try a new Rx insulin aspart (NOVOLOG FLEXPEN) 100 UNIT/ML FlexPen. Her copay will be $90, she would like to know if we have samples for her to try the RX before she spends that kind of money.  Please Advise, Thanks

## 2019-04-24 ENCOUNTER — Other Ambulatory Visit: Payer: Self-pay

## 2019-04-24 MED ORDER — FARXIGA 10 MG PO TABS: 10.0000 mg | ORAL_TABLET | Freq: Every day | ORAL | 2 refills | Status: DC

## 2019-04-24 NOTE — Telephone Encounter (Signed)
Pt is aware of this info and will let us know what the insurance company says about Lispro

## 2019-04-24 NOTE — Telephone Encounter (Signed)
Attempted to call pt but she did not answer and mailbox was full. Will send MyChart message with MD message.

## 2019-04-28 ENCOUNTER — Other Ambulatory Visit: Payer: Self-pay | Admitting: Endocrinology

## 2019-04-28 ENCOUNTER — Other Ambulatory Visit: Payer: Self-pay

## 2019-04-28 MED ORDER — ONETOUCH VERIO FLEX SYSTEM W/DEVICE KIT: PACK | 0 refills | Status: DC

## 2019-04-28 MED ORDER — GLUCOSE BLOOD VI STRP: ORAL_STRIP | 3 refills | Status: DC

## 2019-04-28 MED ORDER — ONETOUCH DELICA LANCETS 30G MISC: 1.0000 | Freq: Three times a day (TID) | 3 refills | Status: AC

## 2019-05-05 ENCOUNTER — Telehealth: Payer: Self-pay | Admitting: Endocrinology

## 2019-05-05 NOTE — Telephone Encounter (Signed)
She will have to go back to [facility] and if her blood sugars start going up we will need to discuss [place] again

## 2019-05-05 NOTE — Telephone Encounter (Signed)
The [place] and metformin were being used before without side effects and there is no connection with headaches and nosebleeds.  She needs to check with her PCP including her blood pressure

## 2019-05-05 NOTE — Telephone Encounter (Signed)
Pt was given MD message, and she refused to accept. Pt stated that her BP was checked when her nose started bleeding and it was fine. Pt is confident that the Merleen Nicely is causing the issues. Pt stated that she is concerned about taking [place] alone as well as Xigduo since [place] is in [place].

## 2019-05-05 NOTE — Telephone Encounter (Signed)
Pt now states that she will continue the Xigduo with Wilder Glade for a few more days and notify Dr. Dwyane Dee if the headaches and epistaxis continues.

## 2019-05-05 NOTE — Telephone Encounter (Signed)
Patient has called in regards to her taking [place]. She states it giving her major headaches and sever noise bleeds. Patient has stopped taking the medicine today.  Please Advise, Thanks

## 2019-05-12 ENCOUNTER — Other Ambulatory Visit: Payer: Self-pay | Admitting: Endocrinology

## 2019-05-13 ENCOUNTER — Other Ambulatory Visit: Payer: Self-pay

## 2019-06-24 ENCOUNTER — Telehealth: Payer: Self-pay | Admitting: Endocrinology

## 2019-06-24 ENCOUNTER — Other Ambulatory Visit: Payer: Self-pay

## 2019-06-24 MED ORDER — INSULIN PEN NEEDLE 31G X 5 MM MISC: 2 refills | Status: DC

## 2019-06-24 NOTE — Telephone Encounter (Signed)
Rx sent an updated.

## 2019-06-24 NOTE — Telephone Encounter (Signed)
Patient requests a new RX for Novolog 4 gauge needles-patient takes 4 injections per day (old RX was for 1 box only=100 needles-patient injects 4 x per day-3 x for Novolog and 1 x for [place]). Patient is completely out of needles. Patient needs at least 124 needles per month. Pharmacy is: [facility] [address] [place] [phone] (Phone) [phone] (Fax)

## 2019-07-18 ENCOUNTER — Other Ambulatory Visit: Payer: Self-pay

## 2019-07-18 ENCOUNTER — Other Ambulatory Visit (INDEPENDENT_AMBULATORY_CARE_PROVIDER_SITE_OTHER): Payer: BC Managed Care – PPO

## 2019-07-18 DIAGNOSIS — E890 Postprocedural hypothyroidism: Secondary | ICD-10-CM | POA: Diagnosis not present

## 2019-07-18 DIAGNOSIS — E1165 Type 2 diabetes mellitus with hyperglycemia: Secondary | ICD-10-CM

## 2019-07-18 LAB — BASIC METABOLIC PANEL
BUN: 16 mg/dL (ref 6–23)
CO2: 29 mEq/L (ref 19–32)
Calcium: 9.4 mg/dL (ref 8.4–10.5)
Chloride: 99 mEq/L (ref 96–112)
Creatinine, Ser: 0.99 mg/dL (ref 0.40–1.20)
GFR: 58.45 mL/min — ABNORMAL LOW (ref >60.00)
Glucose, Bld: 190 mg/dL — ABNORMAL HIGH (ref 70–99)
Potassium: 4.3 mEq/L (ref 3.5–5.1)
Sodium: 136 mEq/L (ref 135–145)

## 2019-07-18 LAB — T4, FREE: Free T4: 1.89 ng/dL — ABNORMAL HIGH (ref 0.60–1.60)

## 2019-07-18 LAB — TSH: TSH: 0.19 u[IU]/mL — ABNORMAL LOW (ref 0.35–4.50)

## 2019-07-19 LAB — FRUCTOSAMINE: Fructosamine: 273 umol/L (ref 0–285)

## 2019-07-22 ENCOUNTER — Ambulatory Visit: Payer: BC Managed Care – PPO | Admitting: Endocrinology

## 2019-07-22 ENCOUNTER — Encounter: Payer: Self-pay | Admitting: Endocrinology

## 2019-07-22 ENCOUNTER — Other Ambulatory Visit: Payer: Self-pay

## 2019-07-22 VITALS — BP 116/70 | HR 90 | Ht 64.5 in | Wt 201.8 lb

## 2019-07-22 DIAGNOSIS — Z6836 Body mass index (BMI) 36.0-36.9, adult: Secondary | ICD-10-CM

## 2019-07-22 DIAGNOSIS — E890 Postprocedural hypothyroidism: Secondary | ICD-10-CM | POA: Diagnosis not present

## 2019-07-22 DIAGNOSIS — E1165 Type 2 diabetes mellitus with hyperglycemia: Secondary | ICD-10-CM

## 2019-07-22 LAB — POCT GLYCOSYLATED HEMOGLOBIN (HGB A1C): Hemoglobin A1C: 7.6 % — AB (ref 4.0–5.6)

## 2019-07-22 NOTE — Patient Instructions (Signed)
Synthroid 112ug   Stop Wilder Glade

## 2019-07-22 NOTE — Progress Notes (Signed)
Patient ID: Nicole Crane, female   DOB: 08/23/1966, 53 y.o.   MRN: [id number]           Reason for Appointment: Endocrinology follow-up  History of Present Illness     DIABETES type II   Diagnosis date:  About 2005  Previous history: She had gestational diabetes in the past Subsequently she developed diabetes which initially was mild and treated with metformin alone In the past her highest weight has been about 300 pounds She thinks her blood sugars are relatively well controlled for a few years with this and subsequently Amaryl added About 4 years ago for better control was given Bydureon which apparently kept her sugars controlled and she had lost some weight Because of high out-of-pocket expense this was stopped about 2 years ago  Recent history:     Non-insulin hypoglycemic drugs: Xigduo 01/999, twice daily, Rybelsus 7 mg daily           INSULIN regimen: Tresiba U-100, 15 units once daily, Novolog 3-5 units AC  Current self management, blood sugar patterns and problems identified:  Her A1c is now 7.6, previously 9.2% Also fructosamine is 273  . She was advised to start NovoLog on her last visit in August but she did not come back for follow-up as directed . She has at the same time increase her Tresiba from 12 up to 15 units . Also has started using the freestyle libre . With the new regimen and also continuing Rybelsus and Farxiga her blood sugars are overall improving . She has only mild increase in postprandial readings at times based on her diet, sometimes will have more carbohydrate or late last night had sweets for snack after supper causing her blood sugar to be over 200 . Also may have some dawn phenomenon and her blood sugar was 190 in the lab after drinking 2 cups of coffee . Otherwise FASTING blood sugars appear to be fairly good most of the time . By mistake she is still taking [place] and she is complaining of excessive diuresis in the morning after  taking the medications . Her weight has gone down about 12 pounds . She has been trying to be very active with more walking can do some exercise bike Also trying to cut back on carbohydrates and get more protein including at breakfast with high-protein yogurt at times   Side effects from medications:  Visual disturbance from Victoza Diet management: She is usually cutting back on carbohydrate intake and avoiding all sweetened drinks      Monitors blood glucose: Once a day.    Glucometer:  Freestyle libre   CONTINUOUS GLUCOSE MONITORING RECORD INTERPRETATION    Dates of Recording: 10/28-11/10  Sensor description: Elenor Legato  Results statistics:   CGM use % of time   Average and SD   Time in range       79%  % Time Above 180   % Time above 250   % Time Below target     Glycemic patterns summary: Elenor Legato appears to be accurate compared with fingersticks or lab glucose Blood sugars are within the target range on an average most of the time except possibly late at night Some data is missing from 10 PM until 2 AM She has periods of hypoglycemia occasionally after lunch and rarely in the evening based on her diet  Hyperglycemic episodes have occurred sporadically after lunch, occasionally late at night  Hypoglycemic episodes not present  Overnight periods: That is not available  for early part of the night but blood sugars usually declining after 1 AM down to average about 130-135 without hypoglycemia  Preprandial periods: Blood sugar appears to be rising about 20 mg after waking up Blood sugars are modestly high around an average at lunch but usually below 130 at dinnertime  Postprandial periods:   Most of her blood sugars appear to be relatively flat with some variability after her meals  PRE-MEAL Fasting Lunch Dinner Bedtime Overall  Glucose range:       Mean/median:  132   126  150 152   POST-MEAL PC Breakfast PC Lunch PC Dinner  Glucose range:     Mean/median:  168  164  161     PREVIOUS readings:   PRE-MEAL Fasting Lunch Dinner Bedtime Overall  Glucose range:  90-166  199-344  188   90-344  Mean/median:  138     172   POST-MEAL PC Breakfast PC Lunch PC Dinner  Glucose range:    138-239  Mean/median:    229              Dietician visit: Most recent:     At diagnosis  Weight control:  Wt Readings from Last 3 Encounters:  07/22/19 201 lb 12.8 oz (91.5 kg)  11/22/18 213 lb (96.6 kg)  10/03/18 213 lb 9.6 oz (96.9 kg)           Diabetes labs:  Lab Results  Component Value Date   HGBA1C 7.6 (A) 07/22/2019   HGBA1C 9.2 (H) 04/18/2019   HGBA1C 7.9 (H) 02/22/2018   Lab Results  Component Value Date   CREATININE 0.99 07/18/2019    Other problems including hypothyroidism: See review of systems   Allergies as of 07/22/2019      Reactions   Alcohol Hives, Shortness Of Breath, Swelling   Antihistamines, Diphenhydramine-type Hives   Avelox [moxifloxacin Hcl In Nacl] Other (See Comments)   hallucinations.   Dilaudid [hydromorphone Hcl] Hives, Itching   Erythromycin Base Hives   Kiwi Extract Itching   Lips itch   Levofloxacin Other (See Comments)   Hallucinations.   Morphine And Related Hives, Itching   Naproxen Other (See Comments)   Severe acid reflux--possible ulcers   Other    Anything with alcohol that she has to injest.   Penicillins Swelling, Other (See Comments)   Welts Has patient had a PCN reaction causing immediate rash, facial/tongue/throat swelling, SOB or lightheadedness with hypotension: Yes--lips swelling Has patient had a PCN reaction causing severe rash involving mucus membranes or skin necrosis: No Has patient had a PCN reaction that required hospitalization: No Has patient had a PCN reaction occurring within the last 10 years: No If all of the above answers are "NO", then may proceed with Cephalosporin use.   Prednisone Other (See Comments)   Heart races with large doses/high dose.   Pregabalin Swelling, Other (See  Comments)   Fluid retention caused weight gained 50 LBS of fluid in ~ 4 days   Statins Other (See Comments)   Muscle/joint aches & pains   Sulfa Antibiotics Nausea And Vomiting   Victoza [liraglutide] Other (See Comments)   Caused visual disturbances (visual impairments)      Medication List       Accurate as of July 22, 2019  1:17 PM. If you have any questions, ask your nurse or doctor.        albuterol 108 (90 Base) MCG/ACT inhaler Commonly known as: VENTOLIN HFA Inhale 1-2 puffs into  the lungs every 6 (six) hours as needed for wheezing or shortness of breath.   ALPRAZolam 0.5 MG tablet Commonly known as: XANAX Take 0.5 mg by mouth at bedtime.   atorvastatin 40 MG tablet Commonly known as: LIPITOR Take 40 mg by mouth every evening.   Farxiga 10 MG Tabs tablet Generic drug: dapagliflozin propanediol Take 10 mg by mouth daily. Take 1 tablet by mouth once daily.   FreeStyle Libre 14 Day Reader Kerrin Mo 1 each by Does not apply route See admin instructions. Use Freestyle Libre reader to monitor blood glucose levels continuously.   FreeStyle Libre 14 Day Sensor Misc 1 each by Does not apply route every 14 (fourteen) days. Apply 1 sensor to body once every 14 days to monitor blood sugar levels.   glucose blood test strip Use Onetouch Verio test strips as instructed to check blood sugar three times daily.   ibuprofen 800 MG tablet Commonly known as: ADVIL Take 800 mg by mouth at bedtime.   insulin degludec 100 UNIT/ML Sopn FlexTouch Pen Commonly known as: Tyler Aas FlexTouch Inject 0.15 mLs (15 Units total) into the skin daily. To start after appointment with diabetes educator What changed:   how much to take  additional instructions   Insulin Pen Needle 31G X 5 MM Misc Use to inject insulin 4 times daily.   levothyroxine 125 MCG tablet Commonly known as: SYNTHROID TAKE ONE TABLET BY MOUTH EVERY MORNING BEFORE BREAKFAST   lisinopril 20 MG tablet Commonly known  as: ZESTRIL Take 20 mg by mouth daily.   NovoLOG FlexPen 100 UNIT/ML FlexPen Generic drug: insulin aspart 3 to 5 units before each meal, keep 2-hour after meal blood sugar under 476   OneTouch Delica Lancets 54Y Misc 1 each by Does not apply route 3 (three) times daily. Use Onetouch Delica Lancets to check blood sugar three times daily.   OneTouch Verio Flex System w/Device Kit Use onetouch verio flex to check blood sugar three times daily.   pantoprazole 40 MG tablet Commonly known as: PROTONIX Take 40 mg by mouth every evening.   Rybelsus 7 MG Tabs Generic drug: Semaglutide TAKE ONE TABLET BY MOUTH ONCE DAILY   Xigduo XR 01-999 MG Tb24 Generic drug: Dapagliflozin-metFORMIN HCl ER Take 2 tablets by mouth daily with breakfast.       Allergies:  Allergies  Allergen Reactions  . Alcohol Hives, Shortness Of Breath and Swelling  . Antihistamines, Diphenhydramine-Type Hives  . Avelox [Moxifloxacin Hcl In Nacl] Other (See Comments)    hallucinations.  . Dilaudid [Hydromorphone Hcl] Hives and Itching  . Erythromycin Base Hives  . Kiwi Extract Itching    Lips itch  . Levofloxacin Other (See Comments)    Hallucinations.  . Morphine And Related Hives and Itching  . Naproxen Other (See Comments)    Severe acid reflux--possible ulcers  . Other     Anything with alcohol that she has to injest.  . Penicillins Swelling and Other (See Comments)    Welts Has patient had a PCN reaction causing immediate rash, facial/tongue/throat swelling, SOB or lightheadedness with hypotension: Yes--lips swelling Has patient had a PCN reaction causing severe rash involving mucus membranes or skin necrosis: No Has patient had a PCN reaction that required hospitalization: No Has patient had a PCN reaction occurring within the last 10 years: No If all of the above answers are "NO", then may proceed with Cephalosporin use.   . Prednisone Other (See Comments)    Heart races with large doses/high  dose.  . Pregabalin Swelling and Other (See Comments)    Fluid retention caused weight gained 50 LBS of fluid in ~ 4 days  . Statins Other (See Comments)    Muscle/joint aches & pains  . Sulfa Antibiotics Nausea And Vomiting  . Victoza [Liraglutide] Other (See Comments)    Caused visual disturbances (visual impairments)    Past Medical History:  Diagnosis Date  . Allergic rhinitis   . Anxiety   . Bilateral leg edema   . DDD (degenerative disc disease), lumbosacral   . Diabetes mellitus without complication ([place])   . GERD (gastroesophageal reflux disease)   . High cholesterol   . Hx of blood clots    post op in abdomen after BSO  . Hypertension   . Hyperthyroidism   . Multiple thyroid nodules   . OA (osteoarthritis) of shoulder   . Spinal stenosis   . Tick bite 02/14/2018   [place] tick    Past Surgical History:  Procedure Laterality Date  . ABDOMINAL HYSTERECTOMY     complete  . BACK SURGERY    . BONE MARROW BIOPSY     after BSO to check clotting  . CHOLECYSTECTOMY    . DILATION AND CURETTAGE OF UTERUS    . ENDOMETRIAL ABLATION    . EXPLORATORY LAPAROTOMY     BSO  . MOUTH SURGERY    . ROTATOR CUFF REPAIR    . THYROIDECTOMY N/A 02/26/2018   Procedure: TOTAL THYROIDECTOMY;  Surgeon: Armandina Gemma, MD;  Location: [facility];  Service: General;  Laterality: N/A;    Family History  Problem Relation Age of Onset  . Diabetes Brother   . Thyroid disease Neg Hx     Social History:  reports that she has never smoked. She has never used smokeless tobacco. She reports that she does not drink alcohol or use drugs.  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  She has been recommended lower doses of levothyroxine when her TSH was low at previous visits consistently She had a TSH of only 0.13 previously in 07/2018 and she was recommended 112 mcg instead of 137 However she restarted taking 125 mcg on her own   Currently on levothyroxine 125 dose previously good levels However TSH is now decreased with a higher T4   She feels fairly good shakiness  She has lost weight recently  Lab Results  Component Value Date   TSH 0.19 (L) 07/18/2019   TSH 0.53 02/17/2019   TSH 0.85 09/27/2018   FREET4 1.89 (H) 07/18/2019   FREET4 1.56 02/17/2019   FREET4 1.39 09/27/2018     Hypertension:  On treatment for at least 2 years from her PCP  Lipids: Has been on atorvastatin 40 mg for quite some time followed by PCP Her last LDL in 2/20 was 91   She does not have any symptoms of numbness, tingling or burning in her feet  Has regular eye exams including in 2019 which did not show any retinopathy     LABS:    Urine microalbumin ratio 25 also done in 2/20  Office Visit on 07/22/2019  Component Date Value Ref Range Status  . Hemoglobin A1C 07/22/2019 7.6* 4.0 - 5.6 % Final  Lab on 07/18/2019  Component Date Value Ref Range Status  . Free T4 07/18/2019 1.89* 0.60 - 1.60 ng/dL Final   Comment: Specimens from patients who are undergoing biotin therapy and /or ingesting biotin supplements may contain  high levels of biotin.  The higher biotin concentration in these specimens interferes with this Free T4 assay.  Specimens that contain high levels  of biotin may cause false high results for this Free T4 assay.  Please interpret results in light of the total clinical presentation of the patient.    Marland Kitchen TSH 07/18/2019 0.19* 0.35 - 4.50 uIU/mL Final  . Sodium 07/18/2019 136  135 - 145 mEq/L Final  . Potassium 07/18/2019 4.3  3.5 - 5.1 mEq/L Final  . Chloride 07/18/2019 99  96 - 112 mEq/L Final  . CO2 07/18/2019 29  19 - 32 mEq/L Final  . Glucose, Bld 07/18/2019 190* 70 - 99 mg/dL Final  . BUN 07/18/2019 16  6 - 23 mg/dL Final  . Creatinine, Ser 07/18/2019 0.99  0.40 - 1.20 mg/dL Final  . GFR 07/18/2019 58.45* >60.00 mL/min Final  . Calcium 07/18/2019 9.4  8.4 - 10.5 mg/dL Final  . Fructosamine 07/18/2019  273  0 - 285 umol/L Final   Comment: Published reference interval for apparently healthy subjects between age 26 and 62 is 49 - 285 umol/L and in a poorly controlled diabetic population is 228 - 563 umol/L with a mean of 396 umol/L.      Examination:   BP 116/70 (BP Location: Left Arm, Patient Position: Sitting, Cuff Size: Normal)   Pulse 90   Ht 5' 4.5" (1.638 m)   Wt 201 lb 12.8 oz (91.5 kg)   SpO2 98%   BMI 34.10 kg/m   Body mass index is 34.1 kg/m.    Physical Exam   ASSESSMENT/ PLAN:    Diabetes type 2 with obesity:   Her A1c is 7.6 and fructosamine is 273  Prior to starting insulin she was 9.2 for the A1c  Although she is taking only small amounts of basal bolus insulin her blood sugars are much better Also she is able to lose weight with improving her diet, regular exercise and continuing Xigduo and Rybelsus She has some variability  No side effects with Farxiga 10 mg and Rybelsus 7 mg  She will occasionally have higher postprandial readings based on her diet and also some degree of dawn phenomenon some days   Recommendations: Although she may need mealtime insulin she can skip this if she is eating a low carbohydrate meal and no dessert or high fat foods May need to have some NovoLog insulin at breakfast if having any carbohydrate Reassured her that her fasting readings are adequate when she wakes up and she does not need to increase her 15 units of [place] She does not need to take [place] along with [place] Can stop this Encouraged her to check sugars consistently with the freestyle libre especially at bedtime to get more complete data   HYPOTHYROIDISM, postsurgical  TSH is lower now and this may be partly related to weight loss She will reduce her dose to 112 again instead of 125 Again will need follow-up TSH on the next visit   There are no Patient Instructions on file for this visit.  Counseling time on subjects discussed in assessment and  plan sections is over 50% of today's 25 minute visit    Elayne Snare 07/22/2019, 1:17 PM

## 2019-08-01 ENCOUNTER — Other Ambulatory Visit: Payer: Self-pay | Admitting: Endocrinology

## 2019-08-31 ENCOUNTER — Other Ambulatory Visit: Payer: Self-pay | Admitting: Endocrinology

## 2019-09-27 ENCOUNTER — Other Ambulatory Visit: Payer: Self-pay | Admitting: Endocrinology

## 2019-10-03 ENCOUNTER — Other Ambulatory Visit: Payer: Self-pay | Admitting: Endocrinology

## 2019-10-16 ENCOUNTER — Other Ambulatory Visit: Payer: Self-pay

## 2019-10-16 MED ORDER — LEVOTHYROXINE SODIUM 112 MCG PO TABS: 112.0000 ug | ORAL_TABLET | Freq: Every day | ORAL | 2 refills | Status: DC

## 2019-10-17 ENCOUNTER — Other Ambulatory Visit: Payer: Self-pay | Admitting: Family Medicine

## 2019-10-17 ENCOUNTER — Ambulatory Visit
Admission: RE | Admit: 2019-10-17 | Discharge: 2019-10-17 | Disposition: A | Discharge status: Home / Self Care | Payer: BC Managed Care – PPO | Source: Ambulatory Visit | Attending: Family Medicine | Admitting: Family Medicine

## 2019-10-17 DIAGNOSIS — J40 Bronchitis, not specified as acute or chronic: Secondary | ICD-10-CM

## 2019-10-20 ENCOUNTER — Other Ambulatory Visit: Payer: BC Managed Care – PPO

## 2019-10-23 ENCOUNTER — Ambulatory Visit: Payer: BC Managed Care – PPO | Admitting: Endocrinology

## 2019-10-31 ENCOUNTER — Other Ambulatory Visit: Payer: Self-pay | Admitting: Endocrinology

## 2019-11-02 ENCOUNTER — Other Ambulatory Visit: Payer: Self-pay | Admitting: Endocrinology

## 2019-11-10 ENCOUNTER — Other Ambulatory Visit: Payer: BC Managed Care – PPO

## 2019-11-13 ENCOUNTER — Ambulatory Visit: Payer: BC Managed Care – PPO | Admitting: Endocrinology

## 2019-11-20 ENCOUNTER — Other Ambulatory Visit: Payer: Self-pay

## 2019-11-20 ENCOUNTER — Other Ambulatory Visit (INDEPENDENT_AMBULATORY_CARE_PROVIDER_SITE_OTHER): Payer: BC Managed Care – PPO

## 2019-11-20 DIAGNOSIS — E1165 Type 2 diabetes mellitus with hyperglycemia: Secondary | ICD-10-CM

## 2019-11-20 DIAGNOSIS — E890 Postprocedural hypothyroidism: Secondary | ICD-10-CM

## 2019-11-20 LAB — LIPID PANEL
Cholesterol: 147 mg/dL (ref 0–200)
HDL: 45.1 mg/dL (ref >39.00)
LDL Cholesterol: 76 mg/dL (ref 0–99)
NonHDL: 102.21
Total CHOL/HDL Ratio: 3
Triglycerides: 133 mg/dL (ref 0.0–149.0)
VLDL: 26.6 mg/dL (ref 0.0–40.0)

## 2019-11-20 LAB — COMPREHENSIVE METABOLIC PANEL
ALT: 22 U/L (ref 0–35)
AST: 16 U/L (ref 0–37)
Albumin: 4 g/dL (ref 3.5–5.2)
Alkaline Phosphatase: 124 U/L — ABNORMAL HIGH (ref 39–117)
BUN: 18 mg/dL (ref 6–23)
CO2: 27 mEq/L (ref 19–32)
Calcium: 9.3 mg/dL (ref 8.4–10.5)
Chloride: 101 mEq/L (ref 96–112)
Creatinine, Ser: 0.92 mg/dL (ref 0.40–1.20)
GFR: 63.53 mL/min (ref >60.00)
Glucose, Bld: 90 mg/dL (ref 70–99)
Potassium: 3.9 mEq/L (ref 3.5–5.1)
Sodium: 136 mEq/L (ref 135–145)
Total Bilirubin: 0.6 mg/dL (ref 0.2–1.2)
Total Protein: 6.8 g/dL (ref 6.0–8.3)

## 2019-11-20 LAB — HEMOGLOBIN A1C: Hgb A1c MFr Bld: 7.9 % — ABNORMAL HIGH (ref 4.6–6.5)

## 2019-11-20 LAB — MICROALBUMIN / CREATININE URINE RATIO
Creatinine,U: 12.6 mg/dL
Microalb Creat Ratio: 5.6 mg/g (ref 0.0–30.0)
Microalb, Ur: <0.7 mg/dL (ref 0.0–1.9)

## 2019-11-20 LAB — TSH: TSH: 0.36 u[IU]/mL (ref 0.35–4.50)

## 2019-11-20 LAB — T4, FREE: Free T4: 1.52 ng/dL (ref 0.60–1.60)

## 2019-11-24 ENCOUNTER — Encounter: Payer: Self-pay | Admitting: Endocrinology

## 2019-11-24 ENCOUNTER — Other Ambulatory Visit: Payer: Self-pay

## 2019-11-24 ENCOUNTER — Ambulatory Visit: Payer: BC Managed Care – PPO | Admitting: Endocrinology

## 2019-11-24 VITALS — BP 120/66 | HR 99 | Ht 64.5 in | Wt 212.0 lb

## 2019-11-24 DIAGNOSIS — E890 Postprocedural hypothyroidism: Secondary | ICD-10-CM | POA: Diagnosis not present

## 2019-11-24 DIAGNOSIS — E1165 Type 2 diabetes mellitus with hyperglycemia: Secondary | ICD-10-CM | POA: Diagnosis not present

## 2019-11-24 DIAGNOSIS — Z6836 Body mass index (BMI) 36.0-36.9, adult: Secondary | ICD-10-CM

## 2019-11-24 MED ORDER — LIOTHYRONINE SODIUM 5 MCG PO TABS: 5.0000 ug | ORAL_TABLET | Freq: Every day | ORAL | 2 refills | Status: DC

## 2019-11-24 MED ORDER — RYBELSUS 14 MG PO TABS: 1.0000 | ORAL_TABLET | Freq: Every day | ORAL | 2 refills | Status: DC

## 2019-11-24 MED ORDER — LEVOTHYROXINE SODIUM 88 MCG PO TABS: 88.0000 ug | ORAL_TABLET | Freq: Every day | ORAL | 3 refills | Status: DC

## 2019-11-24 NOTE — Patient Instructions (Addendum)
Nicole Crane 17-18 units and go up to keep am sugar< 140  Rybelsus 14mg  daily  Check blood sugars on waking up 7 days a week  Also check blood sugars about 2 hours after meals and do this after different meals by rotation  Recommended blood sugar levels on waking up are 90-130 and about 2 hours after meal is 130-160  Please bring your blood sugar monitor to each visit, thank you

## 2019-11-24 NOTE — Progress Notes (Signed)
Patient ID: Nicole Crane, female   DOB: 09-24-1965, 53 y.o.   MRN: [id number]           Reason for Appointment: Endocrinology follow-up  History of Present Illness    DIABETES type II   Diagnosis date:  About 2005  Previous history: She had gestational diabetes in the past Subsequently she developed diabetes which initially was mild and treated with metformin alone In the past her highest weight has been about 300 pounds She thinks her blood sugars are relatively well controlled for a few years with this and subsequently Amaryl added About 4 years ago for better control was given Bydureon which apparently kept her sugars controlled and she had lost some weight Because of high out-of-pocket expense this was stopped about 2 years ago  Recent history:     Non-insulin hypoglycemic drugs: Xigduo 01/999, twice daily, Rybelsus 7 mg daily           INSULIN regimen: Tresiba U-100, 15 units once daily, Novolog 5-7 units AC  Current self management, blood sugar patterns and problems identified:  . Her A1c is now 7.9 compared to 7.6,  . She has had much variability in her blood sugars over the last couple of months . When she had the Covid infection she initially did not need to take any insulin and blood sugars were mostly normal . Subsequently with getting steroids her blood sugars were much higher . Recently has not used her freestyle [place] consistently mostly data available for the first week of March  Despite high readings in the mornings and overnight she has not increased her [place] further  Also on her freestyle libre sugars checking blood sugars only part of the time with most of the information being available for the daytime hours  She likes to take her insulin after eating since she does not know how much she will eat  Does not do her fingersticks on waking up in the morning  Also she thinks the freestyle Elenor Legato is fairly accurate compared to fingersticks  Recently  because of fatigue is not very active and her weight is the same as last year  Side effects from medications:  Visual disturbance from Victoza Diet management: She is usually cutting back on carbohydrate intake and avoiding all sweetened drinks      Monitors blood glucose:  About 2-3 times a day.    Glucometer:  Freestyle libre    PRE-MEAL Fasting Lunch Dinner Bedtime Overall  Glucose range:   110     Mean/median:  173  166  147   176   POST-MEAL PC Breakfast PC Lunch PC Dinner  Glucose range:   87-144  115-143  Mean/median:  191  151  170              Dietician visit: Most recent:     At diagnosis  Weight control:  Wt Readings from Last 3 Encounters:  11/24/19 212 lb (96.2 kg)  07/22/19 201 lb 12.8 oz (91.5 kg)  11/22/18 213 lb (96.6 kg)           Diabetes labs:  Lab Results  Component Value Date   HGBA1C 7.9 (H) 11/20/2019   HGBA1C 7.6 (A) 07/22/2019   HGBA1C 9.2 (H) 04/18/2019   Lab Results  Component Value Date   MICROALBUR <0.7 11/20/2019   LDLCALC 76 11/20/2019   CREATININE 0.92 11/20/2019    Other problems including hypothyroidism: See review of systems   Allergies as of 11/24/2019  Reactions   Alcohol Hives, Shortness Of Breath, Swelling   Antihistamines, Diphenhydramine-type Hives   Avelox [moxifloxacin Hcl In Nacl] Other (See Comments)   hallucinations.   Dilaudid [hydromorphone Hcl] Hives, Itching   Erythromycin Base Hives   Kiwi Extract Itching   Lips itch   Levofloxacin Other (See Comments)   Hallucinations.   Morphine And Related Hives, Itching   Naproxen Other (See Comments)   Severe acid reflux--possible ulcers   Other    Anything with alcohol that she has to injest.   Penicillins Swelling, Other (See Comments)   Welts Has patient had a PCN reaction causing immediate rash, facial/tongue/throat swelling, SOB or lightheadedness with hypotension: Yes--lips swelling Has patient had a PCN reaction causing severe rash involving mucus  membranes or skin necrosis: No Has patient had a PCN reaction that required hospitalization: No Has patient had a PCN reaction occurring within the last 10 years: No If all of the above answers are "NO", then may proceed with Cephalosporin use.   Prednisone Other (See Comments)   Heart races with large doses/high dose.   Pregabalin Swelling, Other (See Comments)   Fluid retention caused weight gained 50 LBS of fluid in ~ 4 days   Statins Other (See Comments)   Muscle/joint aches & pains   Sulfa Antibiotics Nausea And Vomiting   Victoza [liraglutide] Other (See Comments)   Caused visual disturbances (visual impairments)      Medication List       Accurate as of November 24, 2019  1:23 PM. If you have any questions, ask your nurse or doctor.        STOP taking these medications   Farxiga 10 MG Tabs tablet Generic drug: dapagliflozin propanediol Stopped by: Elayne Snare, MD     TAKE these medications   albuterol 108 (90 Base) MCG/ACT inhaler Commonly known as: VENTOLIN HFA Inhale 1-2 puffs into the lungs every 6 (six) hours as needed for wheezing or shortness of breath.   ALPRAZolam 0.5 MG tablet Commonly known as: XANAX Take 0.5 mg by mouth at bedtime.   atorvastatin 40 MG tablet Commonly known as: LIPITOR Take 40 mg by mouth every evening.   FreeStyle Libre 14 Day Reader Kerrin Mo 1 each by Does not apply route See admin instructions. Use Freestyle Libre reader to monitor blood glucose levels continuously.   FreeStyle Libre 14 Day Sensor Misc APPLY ONE SENSOR TO BODY ONCE EVERY 14 DAYS TO MONITOR BLOOD SUGAR LEVELS.   ibuprofen 800 MG tablet Commonly known as: ADVIL Take 800 mg by mouth at bedtime.   Insulin Pen Needle 31G X 5 MM Misc Use to inject insulin 4 times daily.   levothyroxine 112 MCG tablet Commonly known as: SYNTHROID Take 1 tablet (112 mcg total) by mouth daily before breakfast.   lisinopril 20 MG tablet Commonly known as: ZESTRIL Take 20 mg by mouth  daily.   NovoLOG FlexPen 100 UNIT/ML FlexPen Generic drug: insulin aspart 3 to 5 units before each meal, keep 2-hour after meal blood sugar under 585   OneTouch Delica Lancets 27P Misc 1 each by Does not apply route 3 (three) times daily. Use Onetouch Delica Lancets to check blood sugar three times daily.   OneTouch Verio Flex System w/Device Kit Use onetouch verio flex to check blood sugar three times daily.   OneTouch Verio test strip Generic drug: glucose blood USE TO CHECK BLOOD SUGAR 3 TIMES DAILY   pantoprazole 40 MG tablet Commonly known as: PROTONIX Take 40 mg by  mouth every evening.   Rybelsus 7 MG Tabs Generic drug: Semaglutide TAKE ONE TABLET BY MOUTH ONCE DAILY   Tresiba FlexTouch 100 UNIT/ML FlexTouch Pen Generic drug: insulin degludec Inject 15 units under the skin once daily.   Xigduo XR 01-999 MG Tb24 Generic drug: Dapagliflozin-metFORMIN HCl ER TAKE TWO TABLETS BY MOUTH DAILY WITH BREAKFAST (STOP TAKING FARXIGA AND JANUMET; THIS IS A REPLACEMENT FOR THOSE DRUGS!)       Allergies:  Allergies  Allergen Reactions  . Alcohol Hives, Shortness Of Breath and Swelling  . Antihistamines, Diphenhydramine-Type Hives  . Avelox [Moxifloxacin Hcl In Nacl] Other (See Comments)    hallucinations.  . Dilaudid [Hydromorphone Hcl] Hives and Itching  . Erythromycin Base Hives  . Kiwi Extract Itching    Lips itch  . Levofloxacin Other (See Comments)    Hallucinations.  . Morphine And Related Hives and Itching  . Naproxen Other (See Comments)    Severe acid reflux--possible ulcers  . Other     Anything with alcohol that she has to injest.  . Penicillins Swelling and Other (See Comments)    Welts Has patient had a PCN reaction causing immediate rash, facial/tongue/throat swelling, SOB or lightheadedness with hypotension: Yes--lips swelling Has patient had a PCN reaction causing severe rash involving mucus membranes or skin necrosis: No Has patient had a PCN reaction  that required hospitalization: No Has patient had a PCN reaction occurring within the last 10 years: No If all of the above answers are "NO", then may proceed with Cephalosporin use.   . Prednisone Other (See Comments)    Heart races with large doses/high dose.  . Pregabalin Swelling and Other (See Comments)    Fluid retention caused weight gained 50 LBS of fluid in ~ 4 days  . Statins Other (See Comments)    Muscle/joint aches & pains  . Sulfa Antibiotics Nausea And Vomiting  . Victoza [Liraglutide] Other (See Comments)    Caused visual disturbances (visual impairments)    Past Medical History:  Diagnosis Date  . Allergic rhinitis   . Anxiety   . Bilateral leg edema   . DDD (degenerative disc disease), lumbosacral   . Diabetes mellitus without complication ([place])   . GERD (gastroesophageal reflux disease)   . High cholesterol   . Hx of blood clots    post op in abdomen after BSO  . Hypertension   . Hyperthyroidism   . Multiple thyroid nodules   . OA (osteoarthritis) of shoulder   . Spinal stenosis   . Tick bite 02/14/2018   [place] tick    Past Surgical History:  Procedure Laterality Date  . ABDOMINAL HYSTERECTOMY     complete  . BACK SURGERY    . BONE MARROW BIOPSY     after BSO to check clotting  . CHOLECYSTECTOMY    . DILATION AND CURETTAGE OF UTERUS    . ENDOMETRIAL ABLATION    . EXPLORATORY LAPAROTOMY     BSO  . MOUTH SURGERY    . ROTATOR CUFF REPAIR    . THYROIDECTOMY N/A 02/26/2018   Procedure: TOTAL THYROIDECTOMY;  Surgeon: Armandina Gemma, MD;  Location: [facility];  Service: General;  Laterality: N/A;    Family History  Problem Relation Age of Onset  . Diabetes Brother   . Thyroid disease Neg Hx     Social History:  reports that she has never smoked. She has never used smokeless tobacco. She reports that she does not drink alcohol or use drugs.  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   She has been recommended lower doses of levothyroxine when her TSH was low at previous visits consistently She had a TSH of only 0.13 previously in 07/2018 and she was recommended 112 mcg instead of 137  Currently on levothyroxine 112 mcg dose, previously on 125 This was changed in 11/20 because of the low TSH Although previously at her last visit she had lost weight the weight has gone back up  She states that she was very tired last week and aching This was apparently better when she took 150 mcg tablets on her own for 3 to 4 days starting after her labs Does not have any consistent fatigue however  Lab Results  Component Value Date   TSH 0.36 11/20/2019   TSH 0.19 (L) 07/18/2019   TSH 0.53 02/17/2019   FREET4 1.52 11/20/2019   FREET4 1.89 (H) 07/18/2019   FREET4 1.56 02/17/2019   Lab Results  Component Value Date   T3FREE 3.2 09/27/2018   T3FREE 3.3 05/22/2018     Hypertension:  On treatment for at least 2 years from her PCP  Lipids: Has been on atorvastatin 40 mg for quite some time followed by PCP Her LDL in 2/20 was 91  Lab Results  Component Value Date   CHOL 147 11/20/2019   HDL 45.10 11/20/2019   LDLCALC 76 11/20/2019   TRIG 133.0 11/20/2019   CHOLHDL 3 11/20/2019     Has regular eye exams including in 2019 which did not show any retinopathy  She had a UTI in February    LABS:     Lab on 11/20/2019  Component Date Value Ref Range Status  . Microalb, Ur 11/20/2019 <0.7  0.0 - 1.9 mg/dL Final  . Creatinine,U 11/20/2019 12.6  mg/dL Final  . Microalb Creat Ratio 11/20/2019 5.6  0.0 - 30.0 mg/g Final  . Cholesterol 11/20/2019 147  0 - 200 mg/dL Final   ATP III Classification       Desirable:  < 200 mg/dL               Borderline High:  200 - 239 mg/dL          High:  > = 240 mg/dL  . Triglycerides 11/20/2019 133.0  0.0 - 149.0 mg/dL Final   Normal:  <150 mg/dLBorderline High:  150 - 199 mg/dL  . HDL 11/20/2019 45.10  >39.00 mg/dL Final  . VLDL  11/20/2019 26.6  0.0 - 40.0 mg/dL Final  . LDL Cholesterol 11/20/2019 76  0 - 99 mg/dL Final  . Total CHOL/HDL Ratio 11/20/2019 3   Final                  Men          Women1/2 Average Risk     3.4          3.3Average Risk          5.0          4.42X Average Risk          9.6          7.13X Average Risk          15.0          11.0                      . NonHDL 11/20/2019 102.21   Final   NOTE:  Non-HDL goal should be 30 mg/dL higher than patient's LDL goal (i.e. LDL goal of < 70 mg/dL, would have non-HDL goal of < 100 mg/dL)  . Free T4 11/20/2019 1.52  0.60 - 1.60 ng/dL Final   Comment: Specimens from patients who are undergoing biotin therapy and /or ingesting biotin supplements may contain high levels of biotin.  The higher biotin concentration in these specimens interferes with this Free T4 assay.  Specimens that contain high levels  of biotin may cause false high results for this Free T4 assay.  Please interpret results in light of the total clinical presentation of the patient.    Marland Kitchen TSH 11/20/2019 0.36  0.35 - 4.50 uIU/mL Final  . Sodium 11/20/2019 136  135 - 145 mEq/L Final  . Potassium 11/20/2019 3.9  3.5 - 5.1 mEq/L Final  . Chloride 11/20/2019 101  96 - 112 mEq/L Final  . CO2 11/20/2019 27  19 - 32 mEq/L Final  . Glucose, Bld 11/20/2019 90  70 - 99 mg/dL Final  . BUN 11/20/2019 18  6 - 23 mg/dL Final  . Creatinine, Ser 11/20/2019 0.92  0.40 - 1.20 mg/dL Final  . Total Bilirubin 11/20/2019 0.6  0.2 - 1.2 mg/dL Final  . Alkaline Phosphatase 11/20/2019 124* 39 - 117 U/L Final  . AST 11/20/2019 16  0 - 37 U/L Final  . ALT 11/20/2019 22  0 - 35 U/L Final  . Total Protein 11/20/2019 6.8  6.0 - 8.3 g/dL Final  . Albumin 11/20/2019 4.0  3.5 - 5.2 g/dL Final  . GFR 11/20/2019 63.53  >60.00 mL/min Final  . Calcium 11/20/2019 9.3  8.4 - 10.5 mg/dL Final  . Hgb A1c MFr Bld 11/20/2019 7.9* 4.6 - 6.5 % Final   Glycemic Control Guidelines for People with Diabetes:Non Diabetic:  <6%Goal of  Therapy: <7%Additional Action Suggested:  >8%      Examination:   BP 120/66 (BP Location: Left Arm, Patient Position: Sitting, Cuff Size: Normal)   Pulse 99   Ht 5' 4.5" (1.638 m)   Wt 212 lb (96.2 kg)   SpO2 98%   BMI 35.83 kg/m   Body mass index is 35.83 kg/m.    Physical Exam   ASSESSMENT/ PLAN:    Diabetes type 2 with obesity:   Her A1c is 7.9  See history of present illness for detailed discussion of current diabetes management, blood sugar patterns and problems identified  She does not have any consistent blood sugar patterns and difficult to assess her blood sugars since she has incomplete data and not using the freestyle libre consistently Also has had variable blood sugars during her Covid infection and subsequently including high readings with steroids  She appears to have overnight hyperglycemia but not clear what her blood sugars are after dinner She still needs mealtime insulin Does not appear to have consistent high postprandial readings but today blood sugar was higher likely after eating bread  She is continuing Xigduo and Rybelsus 7 mg    Recommendations: Going up on Tresiba until morning sugars are below 140, likely needs 17-18 units Adjust mealtime sugars based on postprandial readings and check more readings after dinner Exercise when able to Trial of 14 mg Rybelsus Stay on Xigduo same doses, she does need to keep up with her fluid intake consistently   HYPOTHYROIDISM, postsurgical  TSH is still low normal now with 112 mcg but she complains of periodic fatigue She is convinced that she needs a higher dose of levothyroxine  since on her own she tried the 150 mcg tablets and this helped her energy level Although previously her baseline free T3 levels were not relatively low she can be given a trial of levothyroxine with liothyronine until her next visit and explained how this is different  She will have the same equivalent dose of about 112 mcg  levothyroxine using 88 mcg levothyroxine and 5 mcg Cytomel   Patient Instructions  Tresiba 17-18 units and go up to keep am sugar< [place] 11/24/2019, 1:23 PM

## 2019-12-04 ENCOUNTER — Other Ambulatory Visit: Payer: Self-pay

## 2019-12-04 ENCOUNTER — Other Ambulatory Visit: Payer: BC Managed Care – PPO

## 2019-12-04 MED ORDER — NOVOLOG FLEXPEN 100 UNIT/ML ~~LOC~~ SOPN: PEN_INJECTOR | SUBCUTANEOUS | 1 refills | Status: DC

## 2019-12-04 MED ORDER — TRESIBA FLEXTOUCH 100 UNIT/ML ~~LOC~~ SOPN: PEN_INJECTOR | SUBCUTANEOUS | 1 refills | Status: DC

## 2019-12-05 ENCOUNTER — Other Ambulatory Visit: Payer: Self-pay | Admitting: Endocrinology

## 2019-12-10 ENCOUNTER — Ambulatory Visit: Payer: BC Managed Care – PPO | Admitting: Endocrinology

## 2020-01-16 ENCOUNTER — Other Ambulatory Visit: Payer: BC Managed Care – PPO

## 2020-01-19 ENCOUNTER — Other Ambulatory Visit (INDEPENDENT_AMBULATORY_CARE_PROVIDER_SITE_OTHER): Payer: BC Managed Care – PPO

## 2020-01-19 ENCOUNTER — Other Ambulatory Visit: Payer: Self-pay

## 2020-01-19 DIAGNOSIS — E1165 Type 2 diabetes mellitus with hyperglycemia: Secondary | ICD-10-CM

## 2020-01-19 DIAGNOSIS — E890 Postprocedural hypothyroidism: Secondary | ICD-10-CM

## 2020-01-19 LAB — T4, FREE: Free T4: 0.99 ng/dL (ref 0.60–1.60)

## 2020-01-19 LAB — BASIC METABOLIC PANEL
BUN: 16 mg/dL (ref 6–23)
CO2: 28 mEq/L (ref 19–32)
Calcium: 9.5 mg/dL (ref 8.4–10.5)
Chloride: 95 mEq/L — ABNORMAL LOW (ref 96–112)
Creatinine, Ser: 0.99 mg/dL (ref 0.40–1.20)
GFR: 58.34 mL/min — ABNORMAL LOW (ref >60.00)
Glucose, Bld: 138 mg/dL — ABNORMAL HIGH (ref 70–99)
Potassium: 3.9 mEq/L (ref 3.5–5.1)
Sodium: 132 mEq/L — ABNORMAL LOW (ref 135–145)

## 2020-01-19 LAB — TSH: TSH: 2.42 u[IU]/mL (ref 0.35–4.50)

## 2020-01-19 LAB — T3, FREE: T3, Free: 2.9 pg/mL (ref 2.3–4.2)

## 2020-01-20 LAB — FRUCTOSAMINE: Fructosamine: 253 umol/L (ref 0–285)

## 2020-01-21 ENCOUNTER — Ambulatory Visit: Payer: BC Managed Care – PPO | Admitting: Endocrinology

## 2020-02-02 ENCOUNTER — Encounter: Payer: Self-pay | Admitting: Endocrinology

## 2020-02-02 ENCOUNTER — Other Ambulatory Visit: Payer: Self-pay

## 2020-02-02 ENCOUNTER — Ambulatory Visit: Payer: BC Managed Care – PPO | Admitting: Endocrinology

## 2020-02-02 VITALS — BP 110/64 | HR 86 | Ht 64.5 in | Wt 206.0 lb

## 2020-02-02 DIAGNOSIS — E1165 Type 2 diabetes mellitus with hyperglycemia: Secondary | ICD-10-CM | POA: Diagnosis not present

## 2020-02-02 DIAGNOSIS — Z794 Long term (current) use of insulin: Secondary | ICD-10-CM | POA: Diagnosis not present

## 2020-02-02 DIAGNOSIS — E890 Postprocedural hypothyroidism: Secondary | ICD-10-CM

## 2020-02-02 NOTE — Progress Notes (Signed)
Patient ID: Nicole Crane, female   DOB: [birth year], 54 y.o.   MRN: [id number]           Reason for Appointment: Endocrinology follow-up  History of Present Illness    DIABETES type II   Diagnosis date:  About 2005  Previous history: She had gestational diabetes in the past Subsequently she developed diabetes which initially was mild and treated with metformin alone In the past her highest weight has been about 300 pounds She thinks her blood sugars are relatively well controlled for a few years with this and subsequently Amaryl added About 4 years ago for better control was given Bydureon which apparently kept her sugars controlled and she had lost some weight Because of high out-of-pocket expense this was stopped about 2 years ago  Recent history:     Non-insulin hypoglycemic drugs: Xigduo 01/999, twice daily, Rybelsus 14 mg daily           INSULIN regimen: Tresiba U-100, 18 units once daily, Novolog 4 -7 units, none recently  Current diabetes management, blood sugar patterns and problems identified:  . Her A1c is last 7.9 compared to 7.6, fructosamine is 253   . She has used the freestyle Elenor Legato much more consistently since her last visit . Although she thinks that blood sugars are much lower with her being on antibiotics for the last 2 weeks or so her blood sugars were reasonably good even last month . She has tolerated the higher dose of 14 mg Rybelsus without difficulty or any nausea; takes this in the morning on waking up . Recently has not taken Humalog since she thinks her blood sugars staying near normal and usually not go up much after meals . The last 2 days she was off her diet because of a family wedding and blood sugars did go up to as high as 253 . Otherwise only has rare postprandial hypoglycemia . She will periodically get low blood sugars if she takes even 4 to 5 units of NovoLog for some carbohydrate containing meals  Her weight has come down  She is  trying to cut back on carbohydrate foods especially at lunchtime  Side effects from medications:  Visual disturbance from Victoza Diet management: She is usually cutting back on carbohydrate intake and avoiding all sweetened drinks      Monitors blood glucose:  About 2-3 times a day.    Glucometer:  Freestyle libre   CONTINUOUS GLUCOSE MONITORING RECORD INTERPRETATION    Dates of Recording: Last 2 weeks  Sensor description:  Results statistics:   CGM use % of time  88  Average and SD  127, GV 31  Time in range    88    %  % Time Above 180  9  % Time above 250 1  % Time Below target  2    PRE-MEAL Fasting Lunch Dinner Bedtime Overall  Glucose range:       Mean/median:  100  134  142  141  127   POST-MEAL PC Breakfast PC Lunch PC Dinner  Glucose range:     Mean/median:  137  141     Glycemic patterns summary:  She feels that her freestyle Elenor Legato is accurate Blood sugars are overall pretty well controlled overnight may be occasionally higher postprandially but not consistently  Hyperglycemic episodes are occurring sporadically in the afternoon or evenings with only about 4 significant episodes  Hypoglycemic episodes occurred rarely with most significant episode on 5/13 after dinner  Overnight periods: Blood sugars are near normal most of the night without any hypoglycemia  Preprandial periods: Blood sugars are near normal at breakfast time, mildly increased both at lunch and dinnertime as above  Postprandial periods:   After breakfast: Blood sugars are rising only modestly with a little variability  After lunch:   Blood sugars are excellent with no significant increase After dinner: Blood sugars are overall well controlled with only about 2 or 3 episodes of high readings and once hypoglycemia occurred  PREVIOUS readings:   PRE-MEAL Fasting Lunch Dinner Bedtime Overall  Glucose range:   110     Mean/median:  173  166  147   176   POST-MEAL PC Breakfast PC Lunch  PC Dinner  Glucose range:   87-144  115-143  Mean/median:  191  151  170              Dietician visit: Most recent:     At diagnosis  Weight control:  Wt Readings from Last 3 Encounters:  02/02/20 206 lb (93.4 kg)  11/24/19 212 lb (96.2 kg)  07/22/19 201 lb 12.8 oz (91.5 kg)           Diabetes labs:  Lab Results  Component Value Date   HGBA1C 7.9 (H) 11/20/2019   HGBA1C 7.6 (A) 07/22/2019   HGBA1C 9.2 (H) 04/18/2019   Lab Results  Component Value Date   MICROALBUR <0.7 11/20/2019   LDLCALC 76 11/20/2019   CREATININE 0.99 01/19/2020    Other problems including hypothyroidism: See review of systems   Allergies as of 02/02/2020      Reactions   Alcohol Hives, Shortness Of Breath, Swelling   Antihistamines, Diphenhydramine-type Hives   Avelox [moxifloxacin Hcl In Nacl] Other (See Comments)   hallucinations.   Dilaudid [hydromorphone Hcl] Hives, Itching   Erythromycin Base Hives   Kiwi Extract Itching   Lips itch   Levofloxacin Other (See Comments)   Hallucinations.   Morphine And Related Hives, Itching   Naproxen Other (See Comments)   Severe acid reflux--possible ulcers   Other    Anything with alcohol that she has to injest.   Penicillins Swelling, Other (See Comments)   Welts Has patient had a PCN reaction causing immediate rash, facial/tongue/throat swelling, SOB or lightheadedness with hypotension: Yes--lips swelling Has patient had a PCN reaction causing severe rash involving mucus membranes or skin necrosis: No Has patient had a PCN reaction that required hospitalization: No Has patient had a PCN reaction occurring within the last 10 years: No If all of the above answers are "NO", then may proceed with Cephalosporin use.   Prednisone Other (See Comments)   Heart races with large doses/high dose.   Pregabalin Swelling, Other (See Comments)   Fluid retention caused weight gained 50 LBS of fluid in ~ 4 days   Statins Other (See Comments)   Muscle/joint  aches & pains   Sulfa Antibiotics Nausea And Vomiting   Victoza [liraglutide] Other (See Comments)   Caused visual disturbances (visual impairments)      Medication List       Accurate as of Feb 02, 2020  1:30 PM. If you have any questions, ask your nurse or doctor.        albuterol 108 (90 Base) MCG/ACT inhaler Commonly known as: VENTOLIN HFA Inhale 1-2 puffs into the lungs every 6 (six) hours as needed for wheezing or shortness of breath.   ALPRAZolam 0.5 MG tablet Commonly known as: XANAX Take 0.5 mg  by mouth at bedtime.   atorvastatin 40 MG tablet Commonly known as: LIPITOR Take 40 mg by mouth every evening.   doxycycline 100 MG capsule Commonly known as: VIBRAMYCIN Take 100 mg by mouth 2 (two) times daily.   FreeStyle Libre 14 Day Reader Kerrin Mo 1 each by Does not apply route See admin instructions. Use Freestyle Libre reader to monitor blood glucose levels continuously.   FreeStyle Libre 14 Day Sensor Misc APPLY ONE SENSOR TO BODY ONCE EVERY 14 DAYS TO MONITOR BLOOD SUGAR LEVELS.   ibuprofen 800 MG tablet Commonly known as: ADVIL Take 800 mg by mouth at bedtime.   Insulin Pen Needle 31G X 5 MM Misc Use to inject insulin 4 times daily.   levothyroxine 88 MCG tablet Commonly known as: SYNTHROID Take 1 tablet (88 mcg total) by mouth daily.   liothyronine 5 MCG tablet Commonly known as: Cytomel Take 1 tablet (5 mcg total) by mouth daily.   lisinopril 20 MG tablet Commonly known as: ZESTRIL Take 20 mg by mouth daily.   NovoLOG FlexPen 100 UNIT/ML FlexPen Generic drug: insulin aspart Inject 5-9 units under the skin three times daily before meals.   OneTouch Delica Lancets 19E Misc 1 each by Does not apply route 3 (three) times daily. Use Onetouch Delica Lancets to check blood sugar three times daily.   OneTouch Verio Flex System w/Device Kit Use onetouch verio flex to check blood sugar three times daily.   OneTouch Verio test strip Generic drug: glucose  blood USE TO CHECK BLOOD SUGAR 3 TIMES DAILY   pantoprazole 40 MG tablet Commonly known as: PROTONIX Take 40 mg by mouth every evening.   Rybelsus 14 MG Tabs Generic drug: Semaglutide Take 1 tablet by mouth daily before breakfast.   Tyler Aas FlexTouch 100 UNIT/ML FlexTouch Pen Generic drug: insulin degludec Inject 18 units under the skin once daily.   Xigduo XR 01-999 MG Tb24 Generic drug: Dapagliflozin-metFORMIN HCl ER TAKE TWO TABLETS BY MOUTH DAILY WITH BREAKFAST     (STOP TAKING FARXIGA AND JANUMET; THIS IS A REPLACEMENT FOR THOSE DRUGS)       Allergies:  Allergies  Allergen Reactions  . Alcohol Hives, Shortness Of Breath and Swelling  . Antihistamines, Diphenhydramine-Type Hives  . Avelox [Moxifloxacin Hcl In Nacl] Other (See Comments)    hallucinations.  . Dilaudid [Hydromorphone Hcl] Hives and Itching  . Erythromycin Base Hives  . Kiwi Extract Itching    Lips itch  . Levofloxacin Other (See Comments)    Hallucinations.  . Morphine And Related Hives and Itching  . Naproxen Other (See Comments)    Severe acid reflux--possible ulcers  . Other     Anything with alcohol that she has to injest.  . Penicillins Swelling and Other (See Comments)    Welts Has patient had a PCN reaction causing immediate rash, facial/tongue/throat swelling, SOB or lightheadedness with hypotension: Yes--lips swelling Has patient had a PCN reaction causing severe rash involving mucus membranes or skin necrosis: No Has patient had a PCN reaction that required hospitalization: No Has patient had a PCN reaction occurring within the last 10 years: No If all of the above answers are "NO", then may proceed with Cephalosporin use.   . Prednisone Other (See Comments)    Heart races with large doses/high dose.  . Pregabalin Swelling and Other (See Comments)    Fluid retention caused weight gained 50 LBS of fluid in ~ 4 days  . Statins Other (See Comments)    Muscle/joint aches &  pains  . Sulfa  Antibiotics Nausea And Vomiting  . Victoza [Liraglutide] Other (See Comments)    Caused visual disturbances (visual impairments)    Past Medical History:  Diagnosis Date  . Allergic rhinitis   . Anxiety   . Bilateral leg edema   . DDD (degenerative disc disease), lumbosacral   . Diabetes mellitus without complication ([place])   . GERD (gastroesophageal reflux disease)   . High cholesterol   . Hx of blood clots    post op in abdomen after BSO  . Hypertension   . Hyperthyroidism   . Multiple thyroid nodules   . OA (osteoarthritis) of shoulder   . Spinal stenosis   . Tick bite 02/14/2018   [place] tick    Past Surgical History:  Procedure Laterality Date  . ABDOMINAL HYSTERECTOMY     complete  . BACK SURGERY    . BONE MARROW BIOPSY     after BSO to check clotting  . CHOLECYSTECTOMY    . DILATION AND CURETTAGE OF UTERUS    . ENDOMETRIAL ABLATION    . EXPLORATORY LAPAROTOMY     BSO  . MOUTH SURGERY    . ROTATOR CUFF REPAIR    . THYROIDECTOMY N/A 02/26/2018   Procedure: TOTAL THYROIDECTOMY;  Surgeon: Armandina Gemma, MD;  Location: [facility];  Service: General;  Laterality: N/A;    Family History  Problem Relation Age of Onset  . Diabetes Brother   . Thyroid disease Neg Hx     Social History:  reports that she has never smoked. She has never used smokeless tobacco. She reports that she does not drink alcohol or use drugs.  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  She would previously complain of fatigue with trying to lower her levothyroxine doses when her TSH was low  In 3/21 on levothyroxine 112 mcg dose her TSH was normal at 0.36 but she was complaining of fatigue She was also concerned about weight gain  She is now on a combination of 88 mcg of levothyroxine along with 5 mcg of liothyronine since 11/2019  This regimen is helping her feel less tired and she feels better overall She takes her medication  consistently before breakfast  TSH is mid normal now; free T3 is below  Lab Results  Component Value Date   TSH 2.42 01/19/2020   TSH 0.36 11/20/2019   TSH 0.19 (L) 07/18/2019   FREET4 0.99 01/19/2020   FREET4 1.52 11/20/2019   FREET4 1.89 (H) 07/18/2019   Lab Results  Component Value Date   T3FREE 2.9 01/19/2020   T3FREE 3.2 09/27/2018   T3FREE 3.3 05/22/2018     Hypertension:  On lisinopril for at least 2 years from her PCP  BP Readings from Last 3 Encounters:  02/02/20 110/64  11/24/19 120/66  07/22/19 116/70     Lipids: Has been on atorvastatin 40 mg for quite some time followed by PCP Her LDL in 2/20 was 91  Lab Results  Component Value Date   CHOL 147 11/20/2019   HDL 45.10 11/20/2019   LDLCALC 76 11/20/2019   TRIG 133.0 11/20/2019   CHOLHDL 3 11/20/2019     Has regular eye exams including in 2020 which did not show any retinopathy      LABS:     No visits with results within 1 Week(s) from this visit.  Latest known visit with results is:  Lab on 01/19/2020  Component Date Value  Ref Range Status  . T3, Free 01/19/2020 2.9  2.3 - 4.2 pg/mL Final  . Free T4 01/19/2020 0.99  0.60 - 1.60 ng/dL Final   Comment: Specimens from patients who are undergoing biotin therapy and /or ingesting biotin supplements may contain high levels of biotin.  The higher biotin concentration in these specimens interferes with this Free T4 assay.  Specimens that contain high levels  of biotin may cause false high results for this Free T4 assay.  Please interpret results in light of the total clinical presentation of the patient.    Marland Kitchen TSH 01/19/2020 2.42  0.35 - 4.50 uIU/mL Final  . Fructosamine 01/19/2020 253  0 - 285 umol/L Final   Comment: Published reference interval for apparently healthy subjects between age 25 and 30 is 8 - 285 umol/L and in a poorly controlled diabetic population is 228 - 563 umol/L with a mean of 396 umol/L.   Marland Kitchen Sodium 01/19/2020 132* 135 - 145  mEq/L Final  . Potassium 01/19/2020 3.9  3.5 - 5.1 mEq/L Final  . Chloride 01/19/2020 95* 96 - 112 mEq/L Final  . CO2 01/19/2020 28  19 - 32 mEq/L Final  . Glucose, Bld 01/19/2020 138* 70 - 99 mg/dL Final  . BUN 01/19/2020 16  6 - 23 mg/dL Final  . Creatinine, Ser 01/19/2020 0.99  0.40 - 1.20 mg/dL Final  . GFR 01/19/2020 58.34* >60.00 mL/min Final  . Calcium 01/19/2020 9.5  8.4 - 10.5 mg/dL Final     Examination:   BP 110/64 (BP Location: Left Arm, Patient Position: Sitting, Cuff Size: Large)   Pulse 86   Ht 5' 4.5" (1.638 m)   Wt 206 lb (93.4 kg)   SpO2 99%   BMI 34.81 kg/m   Body mass index is 34.81 kg/m.    Physical Exam   ASSESSMENT/ PLAN:    Diabetes type 2 with obesity:   Her A1c was last 7.9  See history of present illness for detailed discussion of current diabetes management, blood sugar patterns and problems identified  Fructosamine of 253 indicates improved control  With increasing Rybelsus to 14 mg and continuing Xigduo her blood sugars are improved especially in the last 2 weeks or so Previously had worsening of sugars related to steroids also More recently she has not been taking mealtime insulin and only rarely has high postprandial readings if she has excessive carbohydrate or sweets She is trying to be little more active Has lost weight Fasting and overnight blood sugars are excellent with 18 units of Tresiba without overnight hypoglycemia Tolerating Rybelsus well Renal function stable with current regimen of Xigduo  Recommendations: No change in [place] Since she is sensitive to NovoLog even in low doses will avoid this unless she is having sweets or high carbohydrate intake on special occasions May use 3 to 4 units of NovoLog on those occasions  Increase exercise as tolerated Continue using freestyle libre consistently at all times Also may reduce her Tyler Aas down to 16 if morning sugars are below 90 Check A1c on the next  visit  HYPOTHYROIDISM, postsurgical  She is finally doing better symptomatically with the combination of liothyronine and levothyroxine instead of levothyroxine alone Previously would like to take higher doses of levothyroxine than needed Since her TSH is mid normal she can continue on the same doses of 88 mcg levothyroxine and 5 mcg Cytomel to be taken every morning on empty stomach She can take her Rybelsus and levothyroxine together   There are  no Patient Instructions on file for this visit.      Elayne Snare 02/02/2020, 1:30 PM

## 2020-02-25 ENCOUNTER — Other Ambulatory Visit: Payer: Self-pay | Admitting: Endocrinology

## 2020-03-18 ENCOUNTER — Other Ambulatory Visit: Payer: Self-pay | Admitting: Endocrinology

## 2020-03-24 ENCOUNTER — Other Ambulatory Visit: Payer: Self-pay | Admitting: Endocrinology

## 2020-04-08 ENCOUNTER — Other Ambulatory Visit: Payer: Self-pay | Admitting: Endocrinology

## 2020-04-14 ENCOUNTER — Other Ambulatory Visit: Payer: Self-pay | Admitting: Endocrinology

## 2020-04-22 ENCOUNTER — Other Ambulatory Visit: Payer: Self-pay | Admitting: Endocrinology

## 2020-04-29 ENCOUNTER — Other Ambulatory Visit: Payer: BC Managed Care – PPO

## 2020-05-06 ENCOUNTER — Ambulatory Visit: Payer: BC Managed Care – PPO | Admitting: Endocrinology

## 2020-05-13 ENCOUNTER — Other Ambulatory Visit: Payer: Self-pay | Admitting: Endocrinology

## 2020-05-18 ENCOUNTER — Other Ambulatory Visit: Payer: Self-pay | Admitting: Endocrinology

## 2020-06-08 ENCOUNTER — Other Ambulatory Visit: Payer: BC Managed Care – PPO

## 2020-06-11 ENCOUNTER — Other Ambulatory Visit: Payer: BC Managed Care – PPO

## 2020-06-11 ENCOUNTER — Ambulatory Visit: Payer: BC Managed Care – PPO | Admitting: Endocrinology

## 2020-06-11 ENCOUNTER — Other Ambulatory Visit: Payer: Self-pay

## 2020-06-11 ENCOUNTER — Other Ambulatory Visit (INDEPENDENT_AMBULATORY_CARE_PROVIDER_SITE_OTHER): Payer: BC Managed Care – PPO

## 2020-06-11 DIAGNOSIS — E890 Postprocedural hypothyroidism: Secondary | ICD-10-CM

## 2020-06-11 DIAGNOSIS — Z794 Long term (current) use of insulin: Secondary | ICD-10-CM | POA: Diagnosis not present

## 2020-06-11 DIAGNOSIS — E1165 Type 2 diabetes mellitus with hyperglycemia: Secondary | ICD-10-CM

## 2020-06-11 LAB — TSH: TSH: 2.03 u[IU]/mL (ref 0.35–4.50)

## 2020-06-11 LAB — HEMOGLOBIN A1C: Hgb A1c MFr Bld: 7.2 % — ABNORMAL HIGH (ref 4.6–6.5)

## 2020-06-11 LAB — COMPREHENSIVE METABOLIC PANEL
ALT: 23 U/L (ref 0–35)
AST: 14 U/L (ref 0–37)
Albumin: 4 g/dL (ref 3.5–5.2)
Alkaline Phosphatase: 108 U/L (ref 39–117)
BUN: 16 mg/dL (ref 6–23)
CO2: 27 mEq/L (ref 19–32)
Calcium: 9 mg/dL (ref 8.4–10.5)
Chloride: 100 mEq/L (ref 96–112)
Creatinine, Ser: 1.05 mg/dL (ref 0.40–1.20)
GFR: 54.43 mL/min — ABNORMAL LOW (ref >60.00)
Glucose, Bld: 100 mg/dL — ABNORMAL HIGH (ref 70–99)
Potassium: 4.2 mEq/L (ref 3.5–5.1)
Sodium: 135 mEq/L (ref 135–145)
Total Bilirubin: 0.5 mg/dL (ref 0.2–1.2)
Total Protein: 6.5 g/dL (ref 6.0–8.3)

## 2020-06-11 LAB — T4, FREE: Free T4: 0.94 ng/dL (ref 0.60–1.60)

## 2020-06-13 ENCOUNTER — Other Ambulatory Visit: Payer: Self-pay | Admitting: Endocrinology

## 2020-06-14 ENCOUNTER — Other Ambulatory Visit: Payer: Self-pay | Admitting: Endocrinology

## 2020-06-14 ENCOUNTER — Other Ambulatory Visit: Payer: Self-pay | Admitting: Obstetrics and Gynecology

## 2020-06-14 ENCOUNTER — Other Ambulatory Visit: Payer: Self-pay

## 2020-06-14 ENCOUNTER — Encounter: Payer: Self-pay | Admitting: Endocrinology

## 2020-06-14 ENCOUNTER — Ambulatory Visit (INDEPENDENT_AMBULATORY_CARE_PROVIDER_SITE_OTHER): Payer: BC Managed Care – PPO | Admitting: Endocrinology

## 2020-06-14 VITALS — BP 142/80 | HR 70 | Ht 64.5 in | Wt 207.6 lb

## 2020-06-14 DIAGNOSIS — Z87440 Personal history of urinary (tract) infections: Secondary | ICD-10-CM

## 2020-06-14 DIAGNOSIS — E890 Postprocedural hypothyroidism: Secondary | ICD-10-CM | POA: Diagnosis not present

## 2020-06-14 DIAGNOSIS — Z794 Long term (current) use of insulin: Secondary | ICD-10-CM | POA: Diagnosis not present

## 2020-06-14 DIAGNOSIS — Z0000 Encounter for general adult medical examination without abnormal findings: Secondary | ICD-10-CM

## 2020-06-14 DIAGNOSIS — E1165 Type 2 diabetes mellitus with hyperglycemia: Secondary | ICD-10-CM | POA: Diagnosis not present

## 2020-06-14 DIAGNOSIS — E782 Mixed hyperlipidemia: Secondary | ICD-10-CM

## 2020-06-14 DIAGNOSIS — I10 Essential (primary) hypertension: Secondary | ICD-10-CM

## 2020-06-14 LAB — URINALYSIS, ROUTINE W REFLEX MICROSCOPIC
Bilirubin Urine: NEGATIVE
Hgb urine dipstick: NEGATIVE
Ketones, ur: NEGATIVE
Leukocytes,Ua: NEGATIVE
Nitrite: NEGATIVE
RBC / HPF: NONE SEEN (ref 0–?)
Specific Gravity, Urine: 1.01 (ref 1.000–1.030)
Total Protein, Urine: NEGATIVE
Urine Glucose: >=1000 — AB
Urobilinogen, UA: 0.2 (ref 0.0–1.0)
pH: 5.5 (ref 5.0–8.0)

## 2020-06-14 MED ORDER — XIGDUO XR 2.5-1000 MG PO TB24: 2.0000 | ORAL_TABLET | Freq: Every day | ORAL | 2 refills | Status: DC

## 2020-06-14 NOTE — Patient Instructions (Addendum)
Check blood sugars on waking up 3 days a week  Also check blood sugars about 2 hours after meals and do this after different meals by rotation  Recommended blood sugar levels on waking up are 90-130 and about 2 hours after meal is 130-160  Please bring your blood sugar monitor to each visit, thank you  Tresiba 14   Carb snack in afternoon

## 2020-06-14 NOTE — Progress Notes (Signed)
Patient ID: Nicole Crane, female   DOB: 1965/09/28, 54 y.o.   MRN: [id number]           Reason for Appointment: Endocrinology follow-up  History of Present Illness    DIABETES type II   Diagnosis date:  About 2005  Previous history: She had gestational diabetes in the past Subsequently she developed diabetes which initially was mild and treated with metformin alone In the past her highest weight has been about 300 pounds She thinks her blood sugars are relatively well controlled for a few years with this and subsequently Amaryl added About 4 years ago for better control was given Bydureon which apparently kept her sugars controlled and she had lost some weight Because of high out-of-pocket expense this was stopped about 2 years ago  Recent history:     Non-insulin hypoglycemic drugs: Xigduo 01/999, twice daily, Rybelsus 14 mg daily           INSULIN regimen: Tresiba U-100, 18 units once daily  Current diabetes management, blood sugar patterns and problems identified:  . Her A1c is 7.2, the best in a while   . She has used the [employer] only occasionally with not enough readings over the last 2 weeks especially in the evenings . However blood sugars are generally fairly stable without much variability or spiking . She thinks she tends to have occasional low sugars with symptoms before dinnertime although none documented in the last 2 weeks . This may be because she is sometimes eating only soup or vegetables at lunch and when she comes back home in the afternoon will only have a protein snack before getting more active with housework  Previously had lost weight but now this has leveled off  She is not usually eating breakfast in the mornings, may have a late morning oatmeal sometimes or yogurt  She is trying to do some walking  However has not lost any weight  She is asking about frequent UTIs from Xigduo  Side effects from medications:  Visual disturbance  from Victoza Diet management: She is usually cutting back on carbohydrate intake and avoiding all sweetened drinks      Monitors blood glucose:  About 2-3 times a day.    Glucometer:  Freestyle libre   CGM use % of time  43  2-week average/SD  127  Time in range    89    %  % Time Above 180 8  % Time above 250   % Time Below 70 3     PRE-MEAL Fasting Lunch Dinner Bedtime Overall  Glucose range:       Averages:  114  128  126  140  127   POST-MEAL PC Breakfast PC Lunch PC Dinner  Glucose range:     Averages:  137  126  130     Glycemic patterns summary:  She has found that her freestyle libre is accurate Overall blood sugars are relatively stable but data is incomplete between 4 PM-11 PM and has only rare hypoglycemia such as midmorning or late evening.  Low sugars documented early morning once  Hyperglycemic episodes are occurring sporadically in the midmorning, once in the afternoon and once late evening  Hypoglycemic episodes are infrequent with only one early morning low sugar  Overnight periods: Blood sugars are somewhat variable in the early part of the night but later stable most of the time without excessive hypoglycemia  Preprandial periods: Blood sugars are near normal at breakfast time,  mildly increased at lunch but not documented at dinnertime, appear to be fairly good  Postprandial periods:   No significant postprandial increase except once or twice after breakfast late morning and after lunch is also once after late evenings meal or snack             Dietician visit: Most recent:     At diagnosis  Weight control:  Wt Readings from Last 3 Encounters:  06/14/20 207 lb 9.6 oz (94.2 kg)  02/02/20 206 lb (93.4 kg)  11/24/19 212 lb (96.2 kg)           Diabetes labs:  Lab Results  Component Value Date   HGBA1C 7.2 (H) 06/11/2020   HGBA1C 7.9 (H) 11/20/2019   HGBA1C 7.6 (A) 07/22/2019   Lab Results  Component Value Date   MICROALBUR <0.7 11/20/2019    LDLCALC 76 11/20/2019   CREATININE 1.05 06/11/2020    Other problems including hypothyroidism: See review of systems   Allergies as of 06/14/2020      Reactions   Alcohol Hives, Shortness Of Breath, Swelling   Antihistamines, Diphenhydramine-type Hives   Avelox [moxifloxacin Hcl In Nacl] Other (See Comments)   hallucinations.   Dilaudid [hydromorphone Hcl] Hives, Itching   Erythromycin Base Hives   Kiwi Extract Itching   Lips itch   Levofloxacin Other (See Comments)   Hallucinations.   Morphine And Related Hives, Itching   Naproxen Other (See Comments)   Severe acid reflux--possible ulcers   Other    Anything with alcohol that she has to injest.   Penicillins Swelling, Other (See Comments)   Welts Has patient had a PCN reaction causing immediate rash, facial/tongue/throat swelling, SOB or lightheadedness with hypotension: Yes--lips swelling Has patient had a PCN reaction causing severe rash involving mucus membranes or skin necrosis: No Has patient had a PCN reaction that required hospitalization: No Has patient had a PCN reaction occurring within the last 10 years: No If all of the above answers are "NO", then may proceed with Cephalosporin use.   Prednisone Other (See Comments)   Heart races with large doses/high dose.   Pregabalin Swelling, Other (See Comments)   Fluid retention caused weight gained 50 LBS of fluid in ~ 4 days   Statins Other (See Comments)   Muscle/joint aches & pains   Sulfa Antibiotics Nausea And Vomiting   Victoza [liraglutide] Other (See Comments)   Caused visual disturbances (visual impairments)      Medication List       Accurate as of June 14, 2020  9:45 AM. If you have any questions, ask your nurse or doctor.        albuterol 108 (90 Base) MCG/ACT inhaler Commonly known as: VENTOLIN HFA Inhale 1-2 puffs into the lungs every 6 (six) hours as needed for wheezing or shortness of breath.   ALPRAZolam 0.5 MG tablet Commonly known as:  XANAX Take 0.5 mg by mouth at bedtime.   atorvastatin 40 MG tablet Commonly known as: LIPITOR Take 40 mg by mouth every evening.   doxycycline 100 MG capsule Commonly known as: VIBRAMYCIN Take 100 mg by mouth 2 (two) times daily.   FreeStyle Libre 14 Day Reader Kerrin Mo 1 each by Does not apply route See admin instructions. Use Freestyle Libre reader to monitor blood glucose levels continuously.   FreeStyle Libre 14 Day Sensor Misc 1 each by Other route every 14 (fourteen) days. E11.9   ibuprofen 800 MG tablet Commonly known as: ADVIL Take 800 mg by  mouth at bedtime.   Insulin Pen Needle 31G X 5 MM Misc Use to inject insulin 4 times daily.   levothyroxine 88 MCG tablet Commonly known as: SYNTHROID TAKE ONE TABLET BY MOUTH DAILY   liothyronine 5 MCG tablet Commonly known as: CYTOMEL Take 1 tablet (5 mcg total) by mouth daily.   lisinopril 20 MG tablet Commonly known as: ZESTRIL Take 20 mg by mouth daily.   NovoLOG FlexPen 100 UNIT/ML FlexPen Generic drug: insulin aspart Inject 5-9 units under the skin three times daily before meals.   OneTouch Delica Lancets 77L Misc 1 each by Does not apply route 3 (three) times daily. Use Onetouch Delica Lancets to check blood sugar three times daily.   OneTouch Verio Flex System w/Device Kit Use onetouch verio flex to check blood sugar three times daily.   OneTouch Verio test strip Generic drug: glucose blood USE TO CHECK BLOOD SUGAR 3 TIMES DAILY   pantoprazole 40 MG tablet Commonly known as: PROTONIX Take 40 mg by mouth every evening.   Rybelsus 14 MG Tabs Generic drug: Semaglutide TAKE ONE TABLET BY MOUTH DAILY BEFORE BREAKFAST; START TAKING 7 MILLIGRAM DOSE ONCE FINISHED AS INSTRUCTED BY YOUR DOCTOR   Tyler Aas FlexTouch 100 UNIT/ML FlexTouch Pen Generic drug: insulin degludec Inject 18 units under the skin once daily.   Xigduo XR 01-999 MG Tb24 Generic drug: Dapagliflozin-metFORMIN HCl ER TAKE TWO TABLETS BY MOUTH  DAILY WITH BREAKFAST (STOP TAKING FARXIGA AND JANUMET; THIS IS A REPLACEMENT FOR THOSE DRUGS)       Allergies:  Allergies  Allergen Reactions  . Alcohol Hives, Shortness Of Breath and Swelling  . Antihistamines, Diphenhydramine-Type Hives  . Avelox [Moxifloxacin Hcl In Nacl] Other (See Comments)    hallucinations.  . Dilaudid [Hydromorphone Hcl] Hives and Itching  . Erythromycin Base Hives  . Kiwi Extract Itching    Lips itch  . Levofloxacin Other (See Comments)    Hallucinations.  . Morphine And Related Hives and Itching  . Naproxen Other (See Comments)    Severe acid reflux--possible ulcers  . Other     Anything with alcohol that she has to injest.  . Penicillins Swelling and Other (See Comments)    Welts Has patient had a PCN reaction causing immediate rash, facial/tongue/throat swelling, SOB or lightheadedness with hypotension: Yes--lips swelling Has patient had a PCN reaction causing severe rash involving mucus membranes or skin necrosis: No Has patient had a PCN reaction that required hospitalization: No Has patient had a PCN reaction occurring within the last 10 years: No If all of the above answers are "NO", then may proceed with Cephalosporin use.   . Prednisone Other (See Comments)    Heart races with large doses/high dose.  . Pregabalin Swelling and Other (See Comments)    Fluid retention caused weight gained 50 LBS of fluid in ~ 4 days  . Statins Other (See Comments)    Muscle/joint aches & pains  . Sulfa Antibiotics Nausea And Vomiting  . Victoza [Liraglutide] Other (See Comments)    Caused visual disturbances (visual impairments)    Past Medical History:  Diagnosis Date  . Allergic rhinitis   . Anxiety   . Bilateral leg edema   . DDD (degenerative disc disease), lumbosacral   . Diabetes mellitus without complication ([place])   . GERD (gastroesophageal reflux disease)   . High cholesterol   . Hx of blood clots    post op in abdomen after BSO  .  Hypertension   . Hyperthyroidism   .  Multiple thyroid nodules   . OA (osteoarthritis) of shoulder   . Spinal stenosis   . Tick bite 02/14/2018   [place] tick    Past Surgical History:  Procedure Laterality Date  . ABDOMINAL HYSTERECTOMY     complete  . BACK SURGERY    . BONE MARROW BIOPSY     after BSO to check clotting  . CHOLECYSTECTOMY    . DILATION AND CURETTAGE OF UTERUS    . ENDOMETRIAL ABLATION    . EXPLORATORY LAPAROTOMY     BSO  . MOUTH SURGERY    . ROTATOR CUFF REPAIR    . THYROIDECTOMY N/A 02/26/2018   Procedure: TOTAL THYROIDECTOMY;  Surgeon: Armandina Gemma, MD;  Location: [facility];  Service: General;  Laterality: N/A;    Family History  Problem Relation Age of Onset  . Diabetes Brother   . Thyroid disease Neg Hx     Social History:  reports that she has never smoked. She has never used smokeless tobacco. She reports that she does not drink alcohol and does not use drugs.  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  She would previously complain of fatigue with trying to lower her levothyroxine doses when her TSH was low  In 3/21 on levothyroxine 112 mcg dose her TSH was normal at 0.36 but she was complaining of fatigue She was also concerned about weight gain  She is continuing on a combination of 88 mcg of levothyroxine along with 5 mcg of liothyronine since 11/2019  Does not complain of any fatigue now She takes her medication consistently before breakfast  TSH is stable and normal now; free T3 is in normal range  Lab Results  Component Value Date   TSH 2.03 06/11/2020   TSH 2.42 01/19/2020   TSH 0.36 11/20/2019   FREET4 0.94 06/11/2020   FREET4 0.99 01/19/2020   FREET4 1.52 11/20/2019   Lab Results  Component Value Date   T3FREE 2.9 01/19/2020   T3FREE 3.2 09/27/2018   T3FREE 3.3 05/22/2018     Hypertension:  On lisinopril, followed by PCP, blood pressure higher today from  rushing in  BP Readings from Last 3 Encounters:  06/14/20 (!) 142/80  02/02/20 110/64  11/24/19 120/66     Lipids: Has been on atorvastatin 40 mg for some time, prescribed by PCP  Lab Results  Component Value Date   CHOL 147 11/20/2019   HDL 45.10 11/20/2019   LDLCALC 76 11/20/2019   TRIG 133.0 11/20/2019   CHOLHDL 3 11/20/2019     Has regular eye exams including in 2020 which did not show any retinopathy     She thinks she has had recurrent UTI in the last 6 months also, last prescription was for doxycycline apparently. She is not allergic to Cipro     Lab on 06/11/2020  Component Date Value Ref Range Status  . Free T4 06/11/2020 0.94  0.60 - 1.60 ng/dL Final   Comment: Specimens from patients who are undergoing biotin therapy and /or ingesting biotin supplements may contain high levels of biotin.  The higher biotin concentration in these specimens interferes with this Free T4 assay.  Specimens that contain high levels  of biotin may cause false high results for this Free T4 assay.  Please interpret results in light of the total clinical presentation of the patient.    Marland Kitchen TSH 06/11/2020 2.03  0.35 - 4.50 uIU/mL Final  . Sodium 06/11/2020 135  135 - 145 mEq/L Final  . Potassium 06/11/2020 4.2  3.5 - 5.1 mEq/L Final  . Chloride 06/11/2020 100  96 - 112 mEq/L Final  . CO2 06/11/2020 27  19 - 32 mEq/L Final  . Glucose, Bld 06/11/2020 100* 70 - 99 mg/dL Final  . BUN 06/11/2020 16  6 - 23 mg/dL Final  . Creatinine, Ser 06/11/2020 1.05  0.40 - 1.20 mg/dL Final  . Total Bilirubin 06/11/2020 0.5  0.2 - 1.2 mg/dL Final  . Alkaline Phosphatase 06/11/2020 108  39 - 117 U/L Final  . AST 06/11/2020 14  0 - 37 U/L Final  . ALT 06/11/2020 23  0 - 35 U/L Final  . Total Protein 06/11/2020 6.5  6.0 - 8.3 g/dL Final  . Albumin 06/11/2020 4.0  3.5 - 5.2 g/dL Final  . GFR 06/11/2020 54.43* >60.00 mL/min Final  . Calcium 06/11/2020 9.0  8.4 - 10.5 mg/dL Final  . Hgb A1c MFr Bld 06/11/2020  7.2* 4.6 - 6.5 % Final   Glycemic Control Guidelines for People with Diabetes:Non Diabetic:  <6%Goal of Therapy: <7%Additional Action Suggested:  >8%      Examination:   BP (!) 142/80 (BP Location: Right Arm, Patient Position: Sitting, Cuff Size: Normal)   Pulse 70   Ht 5' 4.5" (1.638 m)   Wt 207 lb 9.6 oz (94.2 kg)   SpO2 98%   BMI 35.08 kg/m   Body mass index is 35.08 kg/m.    Physical Exam   ASSESSMENT/ PLAN:    Diabetes type 2 with obesity:   Her A1c 7.2, improved  See history of present illness for detailed discussion of current diabetes management, blood sugar patterns and problems identified  She is on basal insulin, Xigduo and Rybelsus  Monitoring blood sugars with freestyle libre although not having more than 50% of data in the last 2 weeks She does have tendency to low sugars before dinnertime as discussed above  Recommendations:  Reduce Tresiba to 14 units to avoid hypoglycemia  Make sure she has some protein at night  Have some carbohydrate with her midafternoon snack before she gets active  More blood sugars at night after dinner  Try to use the sensor and scan at least 3-4 times a day to get more data  As a precaution because of history of UTI will reduce her [place] to 2.5 in each tablet of Xigduo  HYPOTHYROIDISM, postsurgical  She has stable thyroid levels and can continue her regimen of 88 mcg levothyroxine and 5 mcg liothyronine She has been regular with her supplements No significant fatigue lately  Reportedly has recurrent UTI but not clear if this is related to [place] To check urinalysis today and if need be urine culture  There are no Patient Instructions on file for this visit.      Elayne Snare 06/14/2020, 9:45 AM

## 2020-06-21 ENCOUNTER — Other Ambulatory Visit: Payer: Self-pay | Admitting: Endocrinology

## 2020-07-19 ENCOUNTER — Other Ambulatory Visit: Payer: Self-pay | Admitting: Endocrinology

## 2020-07-25 ENCOUNTER — Other Ambulatory Visit: Payer: Self-pay | Admitting: Endocrinology

## 2020-07-30 ENCOUNTER — Ambulatory Visit
Admission: RE | Admit: 2020-07-30 | Discharge: 2020-07-30 | Disposition: A | Discharge status: Home / Self Care | Payer: BC Managed Care – PPO | Source: Ambulatory Visit | Attending: Obstetrics and Gynecology | Admitting: Obstetrics and Gynecology

## 2020-07-30 ENCOUNTER — Other Ambulatory Visit: Payer: Self-pay

## 2020-07-30 DIAGNOSIS — Z0000 Encounter for general adult medical examination without abnormal findings: Secondary | ICD-10-CM

## 2020-07-31 ENCOUNTER — Other Ambulatory Visit: Payer: Self-pay | Admitting: Endocrinology

## 2020-09-08 ENCOUNTER — Other Ambulatory Visit: Payer: Self-pay | Admitting: *Deleted

## 2020-09-08 ENCOUNTER — Telehealth: Payer: Self-pay | Admitting: Endocrinology

## 2020-09-08 MED ORDER — FREESTYLE LIBRE 14 DAY READER DEVI: 1.0000 | 1 refills | Status: DC

## 2020-09-08 MED ORDER — FREESTYLE LIBRE 14 DAY SENSOR MISC: 1.0000 | 2 refills | Status: DC

## 2020-09-08 NOTE — Telephone Encounter (Signed)
Rx's have been sent. 

## 2020-09-08 NOTE — Telephone Encounter (Signed)
Patient called and requested a RX for the Free Style Libre 2 sensor and transmitter to the [facility] on [address]

## 2020-09-14 ENCOUNTER — Other Ambulatory Visit: Payer: BC Managed Care – PPO

## 2020-09-17 ENCOUNTER — Other Ambulatory Visit: Payer: Self-pay | Admitting: *Deleted

## 2020-09-17 ENCOUNTER — Ambulatory Visit: Payer: BC Managed Care – PPO | Admitting: Endocrinology

## 2020-09-17 MED ORDER — FREESTYLE LIBRE 2 SENSOR MISC: 3 refills | Status: DC

## 2020-09-17 MED ORDER — FREESTYLE LIBRE 2 READER DEVI: 1 refills | Status: DC

## 2020-10-07 ENCOUNTER — Other Ambulatory Visit: Payer: Self-pay | Admitting: *Deleted

## 2020-10-07 MED ORDER — RYBELSUS 14 MG PO TABS: ORAL_TABLET | ORAL | 0 refills | Status: DC

## 2020-10-18 ENCOUNTER — Other Ambulatory Visit: Payer: Self-pay | Admitting: *Deleted

## 2020-10-18 MED ORDER — LEVOTHYROXINE SODIUM 88 MCG PO TABS: 88.0000 ug | ORAL_TABLET | Freq: Every day | ORAL | 0 refills | Status: DC

## 2020-11-22 ENCOUNTER — Other Ambulatory Visit: Payer: Self-pay | Admitting: *Deleted

## 2020-11-22 ENCOUNTER — Telehealth: Payer: Self-pay | Admitting: Endocrinology

## 2020-11-22 DIAGNOSIS — E890 Postprocedural hypothyroidism: Secondary | ICD-10-CM

## 2020-11-22 MED ORDER — LIOTHYRONINE SODIUM 5 MCG PO TABS: 5.0000 ug | ORAL_TABLET | Freq: Every day | ORAL | 1 refills | Status: DC

## 2020-11-22 NOTE — Telephone Encounter (Signed)
MEDICATION: liothyronine (CYTOMEL) 5 MCG tablet  PHARMACY:   [facility] [address], [place] - [address] Phone:  [phone]  Fax:  [phone]      HAS THE PATIENT CONTACTED THEIR PHARMACY?  Yes-PHARM told Patient they sent 3 requests for the above RX with no response  IS THIS A 90 DAY SUPPLY : Yes  IS PATIENT OUT OF MEDICATION: Yes  IF NOT; HOW MUCH IS LEFT: 0  LAST APPOINTMENT DATE: @2 /03/2021  NEXT APPOINTMENT DATE:@Visit  date not found  DO WE HAVE YOUR PERMISSION TO LEAVE A DETAILED MESSAGE?: Yes  OTHER COMMENTS: Patient's requests the above RX be sent asap   **Let patient know to contact pharmacy at the end of the day to make sure medication is ready. **  ** Please notify patient to allow 48-72 hours to process**  **Encourage patient to contact the pharmacy for refills or they can request refills through [facility]**

## 2020-11-22 NOTE — Telephone Encounter (Signed)
Sent rx Liothyronine to [facility]

## 2020-12-06 ENCOUNTER — Other Ambulatory Visit: Payer: Self-pay | Admitting: *Deleted

## 2020-12-06 MED ORDER — XIGDUO XR 5-1000 MG PO TB24: ORAL_TABLET | ORAL | 3 refills | Status: DC

## 2020-12-09 ENCOUNTER — Telehealth: Payer: Self-pay | Admitting: Endocrinology

## 2020-12-09 NOTE — Telephone Encounter (Signed)
MEDICATION: insulin degludec (TRESIBA FLEXTOUCH) 100 UNIT/ML FlexTouch Pen   AND Semaglutide (RYBELSUS) 14 MG TABS  PHARMACY:   [facility] [address] [place] - [address] Phone:  [phone]  Fax:  [phone]       HAS THE PATIENT CONTACTED THEIR PHARMACY? Yes  IS THIS A 90 DAY SUPPLY : If possible Patient prefers 90 day supply  IS PATIENT OUT OF MEDICATION: Yes  IF NOT; HOW MUCH IS LEFT: 0  LAST APPOINTMENT DATE: @3 /28/2022  NEXT APPOINTMENT DATE:@5 /07/2021  DO WE HAVE YOUR PERMISSION TO LEAVE A DETAILED MESSAGE?: Yes  OTHER COMMENTS:    **Let patient know to contact pharmacy at the end of the day to make sure medication is ready. **  ** Please notify patient to allow 48-72 hours to process**  **Encourage patient to contact the pharmacy for refills or they can request refills through [facility]**

## 2020-12-10 ENCOUNTER — Telehealth: Payer: Self-pay | Admitting: Endocrinology

## 2020-12-10 ENCOUNTER — Other Ambulatory Visit: Payer: Self-pay | Admitting: *Deleted

## 2020-12-10 DIAGNOSIS — E1165 Type 2 diabetes mellitus with hyperglycemia: Secondary | ICD-10-CM

## 2020-12-10 DIAGNOSIS — Z794 Long term (current) use of insulin: Secondary | ICD-10-CM

## 2020-12-10 MED ORDER — RYBELSUS 14 MG PO TABS: ORAL_TABLET | ORAL | 0 refills | Status: DC

## 2020-12-10 MED ORDER — TRESIBA FLEXTOUCH 100 UNIT/ML ~~LOC~~ SOPN: PEN_INJECTOR | SUBCUTANEOUS | 0 refills | Status: DC

## 2020-12-10 NOTE — Telephone Encounter (Signed)
Pt called because she is unable to get her Rybelsus at the pharmacy at this time. She is having troubles with insurance covering it and has been without the medication for a week and her blood sugars have been running high and she doesn't know what else to do since we don't have any samples here. Pt is requesting a call with what are some things she should do until her insurance gets figured out.   Pt adds this morning her blood sugars were 200 before she ate  Ph# [phone]

## 2020-12-10 NOTE — Telephone Encounter (Signed)
Sent Rx to [facility].

## 2020-12-13 NOTE — Telephone Encounter (Signed)
Ok, then please instead increase the insulin to 25 units qd.  Please call later this week if this does not help.

## 2020-12-13 NOTE — Telephone Encounter (Signed)
Please advise 

## 2020-12-14 ENCOUNTER — Encounter: Payer: Self-pay | Admitting: *Deleted

## 2020-12-14 ENCOUNTER — Other Ambulatory Visit: Payer: Self-pay | Admitting: *Deleted

## 2020-12-14 DIAGNOSIS — E890 Postprocedural hypothyroidism: Secondary | ICD-10-CM

## 2020-12-14 MED ORDER — LEVOTHYROXINE SODIUM 88 MCG PO TABS: 88.0000 ug | ORAL_TABLET | Freq: Every day | ORAL | 0 refills | Status: DC

## 2020-12-14 MED ORDER — RYBELSUS 14 MG PO TABS: 14.0000 mg | ORAL_TABLET | ORAL | 0 refills | Status: DC

## 2020-12-14 MED ORDER — METFORMIN HCL ER 500 MG PO TB24
2000.0000 mg | ORAL_TABLET | Freq: Every day | ORAL | 3 refills | Status: DC
Start: 2020-12-14 — End: 2021-12-26

## 2020-12-14 NOTE — Telephone Encounter (Signed)
Spoke to pt --stated finally received the rybelsus which insurance--only cover 30 days. Pt having problem with the xigduo--causing UTI (13 times) in a year and would like to d/c ,do not want to take antibiotic anymore.

## 2020-12-14 NOTE — Addendum Note (Signed)
Addended by: Romero Belling on: 12/14/2020 12:46 PM   Modules accepted: Orders

## 2020-12-14 NOTE — Telephone Encounter (Signed)
I have sent a prescription to your pharmacy, to change Xigduo to metformin only, and to refill the metformin.  F/u ov is due

## 2020-12-14 NOTE — Telephone Encounter (Signed)
Unable to leave message, but sent my chart message.

## 2020-12-15 ENCOUNTER — Encounter: Payer: Self-pay | Admitting: *Deleted

## 2020-12-15 NOTE — Telephone Encounter (Signed)
Unable to LVM, but sent [facility] message.

## 2021-01-18 ENCOUNTER — Other Ambulatory Visit (INDEPENDENT_AMBULATORY_CARE_PROVIDER_SITE_OTHER): Payer: Self-pay

## 2021-01-18 ENCOUNTER — Other Ambulatory Visit: Payer: Self-pay

## 2021-01-18 DIAGNOSIS — Z794 Long term (current) use of insulin: Secondary | ICD-10-CM

## 2021-01-18 DIAGNOSIS — E1165 Type 2 diabetes mellitus with hyperglycemia: Secondary | ICD-10-CM

## 2021-01-18 DIAGNOSIS — E782 Mixed hyperlipidemia: Secondary | ICD-10-CM

## 2021-01-18 LAB — COMPREHENSIVE METABOLIC PANEL
ALT: 18 U/L (ref 0–35)
AST: 11 U/L (ref 0–37)
Albumin: 4 g/dL (ref 3.5–5.2)
Alkaline Phosphatase: 124 U/L — ABNORMAL HIGH (ref 39–117)
BUN: 12 mg/dL (ref 6–23)
CO2: 28 mEq/L (ref 19–32)
Calcium: 8.8 mg/dL (ref 8.4–10.5)
Chloride: 101 mEq/L (ref 96–112)
Creatinine, Ser: 0.87 mg/dL (ref 0.40–1.20)
GFR: 74.96 mL/min (ref >60.00)
Glucose, Bld: 250 mg/dL — ABNORMAL HIGH (ref 70–99)
Potassium: 4.2 mEq/L (ref 3.5–5.1)
Sodium: 136 mEq/L (ref 135–145)
Total Bilirubin: 0.6 mg/dL (ref 0.2–1.2)
Total Protein: 6.5 g/dL (ref 6.0–8.3)

## 2021-01-18 LAB — HEMOGLOBIN A1C: Hgb A1c MFr Bld: 7.6 % — ABNORMAL HIGH (ref 4.6–6.5)

## 2021-01-18 LAB — LDL CHOLESTEROL, DIRECT: Direct LDL: 75 mg/dL

## 2021-01-19 ENCOUNTER — Other Ambulatory Visit: Payer: Self-pay

## 2021-01-20 ENCOUNTER — Other Ambulatory Visit (INDEPENDENT_AMBULATORY_CARE_PROVIDER_SITE_OTHER): Payer: Self-pay

## 2021-01-20 ENCOUNTER — Other Ambulatory Visit: Payer: Self-pay

## 2021-01-20 ENCOUNTER — Other Ambulatory Visit: Payer: Self-pay | Admitting: Endocrinology

## 2021-01-20 DIAGNOSIS — E890 Postprocedural hypothyroidism: Secondary | ICD-10-CM

## 2021-01-20 LAB — TSH: TSH: 2.38 u[IU]/mL (ref 0.35–4.50)

## 2021-01-20 LAB — T3, FREE: T3, Free: 2.8 pg/mL (ref 2.3–4.2)

## 2021-01-20 LAB — T4, FREE: Free T4: 1.03 ng/dL (ref 0.60–1.60)

## 2021-01-24 ENCOUNTER — Other Ambulatory Visit: Payer: Self-pay

## 2021-01-24 ENCOUNTER — Ambulatory Visit: Payer: Self-pay | Admitting: Endocrinology

## 2021-01-24 ENCOUNTER — Encounter: Payer: Self-pay | Admitting: Endocrinology

## 2021-01-24 VITALS — BP 126/80 | HR 72 | Ht 65.0 in | Wt 210.4 lb

## 2021-01-24 DIAGNOSIS — E890 Postprocedural hypothyroidism: Secondary | ICD-10-CM

## 2021-01-24 DIAGNOSIS — Z794 Long term (current) use of insulin: Secondary | ICD-10-CM

## 2021-01-24 DIAGNOSIS — E1165 Type 2 diabetes mellitus with hyperglycemia: Secondary | ICD-10-CM

## 2021-01-24 DIAGNOSIS — E782 Mixed hyperlipidemia: Secondary | ICD-10-CM

## 2021-01-24 MED ORDER — LEVOTHYROXINE SODIUM 88 MCG PO TABS
88.0000 ug | ORAL_TABLET | Freq: Every day | ORAL | 1 refills | Status: DC
Start: 2021-01-24 — End: 2021-01-26

## 2021-01-24 NOTE — Progress Notes (Signed)
Patient ID: BRADEE COMMON, female   DOB: 23-Jul-1966, 55 y.o.   MRN: [id number]           Reason for Appointment: Endocrinology follow-up  History of Present Illness    DIABETES type II   Diagnosis date:  About 2005  Previous history: She had gestational diabetes in the past Subsequently she developed diabetes which initially was mild and treated with metformin alone In the past her highest weight has been about 300 pounds She thinks her blood sugars are relatively well controlled for a few years with this and subsequently Amaryl added About 4 years ago for better control was given Bydureon which apparently kept her sugars controlled and she had lost some weight Because of high out-of-pocket expense this was stopped about 2 years ago  Recent history:     Non-insulin hypoglycemic drugs:  Rybelsus 14 mg daily 2 g daily           INSULIN regimen: Tresiba U-100, 18 units once daily  Current diabetes management, blood sugar patterns and problems identified:  . Her A1c is 7.6, previously 7.2 She has not been seen in follow-up since 10/21   She has used the freestyle [place] on most days and interpretation of her download is as follows  She appears to have more consistently high fasting readings although slightly better in the last few days  Also recently in the last 2 weeks blood sugars are higher compared to the last visit  Also with certain foods she will have significantly high postprandial readings at times sometimes with having higher glycemic index foods like sandwiches and chips  However she was out of her Rybelsus and probably started back on this again 3 to 4 weeks ago  She will have [place] yogurt and granola at breakfast periodically   She is trying to do some walking after work but only recently more regularly  Because of having frequent UTIs she stopped taking Xigduo in early April and has not had any since then  Side effects from medications:  Visual  disturbance from Victoza Diet management: She is usually cutting back on carbohydrate intake and avoiding all sweetened drinks      Interpretation of her freestyle [employer] shows the following   HYPERGLYCEMIC episodes are present occasionally later around lunchtime or late evening, usually not in the morning  No hypoglycemic episodes present  On an average blood sugars are fairly consistently over 150 average with HIGHEST average 208 after lunch and 198 at bedtime  Blood sugar data is only intermittently available on most days  Blood sugar has been high as recently as on the light of 5/14  Overall postprandial blood sugars are not consistently appearing to be higher  CGM use % of time  54  2-week average/GV  176/23  Time in range    61    %  % Time Above 180  33  % Time above 250 6  % Time Below 70      PRE-MEAL Fasting Lunch Dinner Bedtime Overall  Glucose range:       Averages:  161    198    POST-MEAL PC Breakfast PC Lunch PC Dinner  Glucose range:     Averages:   208  185    Prior  CGM use % of time  43  2-week average/SD  127  Time in range    89    %  % Time Above 180 8  % Time above 250   %  Time Below 70 3     PRE-MEAL Fasting Lunch Dinner Bedtime Overall  Glucose range:       Averages:  114  128  126  140  127   POST-MEAL PC Breakfast PC Lunch PC Dinner  Glucose range:     Averages:  137  126  130     Previous glycemic patterns summary:  She has found that her freestyle Elenor Legato is accurate Overall blood sugars are relatively stable but data is incomplete between 4 PM-11 PM and has only rare hypoglycemia such as midmorning or late evening.  Low sugars documented early morning once            Dietician visit: Most recent:     At diagnosis  Weight control:  Wt Readings from Last 3 Encounters:  01/24/21 210 lb 6.4 oz (95.4 kg)  06/14/20 207 lb 9.6 oz (94.2 kg)  02/02/20 206 lb (93.4 kg)           Diabetes labs:  Lab Results  Component  Value Date   HGBA1C 7.6 (H) 01/18/2021   HGBA1C 7.2 (H) 06/11/2020   HGBA1C 7.9 (H) 11/20/2019   Lab Results  Component Value Date   MICROALBUR <0.7 11/20/2019   [place] 76 11/20/2019   CREATININE 0.87 01/18/2021    Other problems including hypothyroidism: See review of systems   Allergies as of 01/24/2021      Reactions   Alcohol Hives, Shortness Of Breath, Swelling   Antihistamines, Diphenhydramine-type Hives   Avelox [moxifloxacin Hcl In Nacl] Other (See Comments)   hallucinations.   Dilaudid [hydromorphone Hcl] Hives, Itching   Erythromycin Base Hives   Kiwi Extract Itching   Lips itch   Levofloxacin Other (See Comments)   Hallucinations.   Morphine And Related Hives, Itching   Naproxen Other (See Comments)   Severe acid reflux--possible ulcers   Other    Anything with alcohol that she has to injest.   Penicillins Swelling, Other (See Comments)   Welts Has patient had a PCN reaction causing immediate rash, facial/tongue/throat swelling, SOB or lightheadedness with hypotension: Yes--lips swelling Has patient had a PCN reaction causing severe rash involving mucus membranes or skin necrosis: No Has patient had a PCN reaction that required hospitalization: No Has patient had a PCN reaction occurring within the last 10 years: No If all of the above answers are "NO", then may proceed with Cephalosporin use.   Prednisone Other (See Comments)   Heart races with large doses/high dose.   Pregabalin Swelling, Other (See Comments)   Fluid retention caused weight gained 50 LBS of fluid in ~ 4 days   Statins Other (See Comments)   Muscle/joint aches & pains   Sulfa Antibiotics Nausea And Vomiting   Victoza [liraglutide] Other (See Comments)   Caused visual disturbances (visual impairments)   Xigduo Xr [dapagliflozin-metformin Hcl Er]    Causes UTI's      Medication List       Accurate as of Jan 24, 2021  2:15 PM. If you have any questions, ask your nurse or doctor.         albuterol 108 (90 Base) MCG/ACT inhaler Commonly known as: VENTOLIN HFA Inhale 1-2 puffs into the lungs every 6 (six) hours as needed for wheezing or shortness of breath.   ALPRAZolam 0.5 MG tablet Commonly known as: XANAX Take 0.5 mg by mouth at bedtime.   atorvastatin 40 MG tablet Commonly known as: LIPITOR Take 40 mg by mouth every evening.  doxycycline 100 MG capsule Commonly known as: VIBRAMYCIN Take 100 mg by mouth 2 (two) times daily.   FreeStyle Libre 2 Reader Kerrin Mo Use to monitor blood sugar   FreeStyle Libre 2 Sensor Misc Use 1 every 14 days   ibuprofen 800 MG tablet Commonly known as: ADVIL Take 800 mg by mouth at bedtime.   Insulin Pen Needle 31G X 5 MM Misc Use to inject insulin 4 times daily.   levothyroxine 88 MCG tablet Commonly known as: SYNTHROID Take 1 tablet (88 mcg total) by mouth daily.   liothyronine 5 MCG tablet Commonly known as: CYTOMEL Take 1 tablet (5 mcg total) by mouth daily.   lisinopril 20 MG tablet Commonly known as: ZESTRIL Take 20 mg by mouth daily.   metFORMIN 500 MG 24 hr tablet Commonly known as: GLUCOPHAGE-XR Take 4 tablets (2,000 mg total) by mouth daily.   OneTouch Delica Lancets 37T Misc 1 each by Does not apply route 3 (three) times daily. Use Onetouch Delica Lancets to check blood sugar three times daily.   OneTouch Verio Flex System w/Device Kit Use onetouch verio flex to check blood sugar three times daily.   OneTouch Verio test strip Generic drug: glucose blood USE TO CHECK BLOOD SUGAR 3 TIMES DAILY   pantoprazole 40 MG tablet Commonly known as: PROTONIX Take 40 mg by mouth every evening.   Rybelsus 14 MG Tabs Generic drug: Semaglutide Take 14 mg by mouth every morning.   Tyler Aas FlexTouch 100 UNIT/ML FlexTouch Pen Generic drug: insulin degludec Inject 18 units under the skin once daily.       Allergies:  Allergies  Allergen Reactions  . Alcohol Hives, Shortness Of Breath and Swelling  .  Antihistamines, Diphenhydramine-Type Hives  . Avelox [Moxifloxacin Hcl In Nacl] Other (See Comments)    hallucinations.  . Dilaudid [Hydromorphone Hcl] Hives and Itching  . Erythromycin Base Hives  . Kiwi Extract Itching    Lips itch  . Levofloxacin Other (See Comments)    Hallucinations.  . Morphine And Related Hives and Itching  . Naproxen Other (See Comments)    Severe acid reflux--possible ulcers  . Other     Anything with alcohol that she has to injest.  . Penicillins Swelling and Other (See Comments)    Welts Has patient had a PCN reaction causing immediate rash, facial/tongue/throat swelling, SOB or lightheadedness with hypotension: Yes--lips swelling Has patient had a PCN reaction causing severe rash involving mucus membranes or skin necrosis: No Has patient had a PCN reaction that required hospitalization: No Has patient had a PCN reaction occurring within the last 10 years: No If all of the above answers are "NO", then may proceed with Cephalosporin use.   . Prednisone Other (See Comments)    Heart races with large doses/high dose.  . Pregabalin Swelling and Other (See Comments)    Fluid retention caused weight gained 50 LBS of fluid in ~ 4 days  . Statins Other (See Comments)    Muscle/joint aches & pains  . Sulfa Antibiotics Nausea And Vomiting  . Victoza [Liraglutide] Other (See Comments)    Caused visual disturbances (visual impairments)  . Xigduo Xr [Dapagliflozin-Metformin Hcl Er]     Causes UTI's    Past Medical History:  Diagnosis Date  . Allergic rhinitis   . Anxiety   . Bilateral leg edema   . DDD (degenerative disc disease), lumbosacral   . Diabetes mellitus without complication ([place])   . GERD (gastroesophageal reflux disease)   . High  cholesterol   . Hx of blood clots    post op in abdomen after BSO  . Hypertension   . Hyperthyroidism   . Multiple thyroid nodules   . OA (osteoarthritis) of shoulder   . Spinal stenosis   . Tick bite 02/14/2018    [place] tick    Past Surgical History:  Procedure Laterality Date  . ABDOMINAL HYSTERECTOMY     complete  . BACK SURGERY    . BONE MARROW BIOPSY     after BSO to check clotting  . CHOLECYSTECTOMY    . DILATION AND CURETTAGE OF UTERUS    . ENDOMETRIAL ABLATION    . EXPLORATORY LAPAROTOMY     BSO  . MOUTH SURGERY    . ROTATOR CUFF REPAIR    . THYROIDECTOMY N/A 02/26/2018   Procedure: TOTAL THYROIDECTOMY;  Surgeon: Armandina Gemma, MD;  Location: [facility];  Service: General;  Laterality: N/A;    Family History  Problem Relation Age of Onset  . Diabetes Brother   . Breast cancer Mother   . Breast cancer Maternal Grandmother   . Thyroid disease Neg Hx     Social History:  reports that she has never smoked. She has never used smokeless tobacco. She reports that she does not drink alcohol and does not use drugs.  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  She would previously complain of fatigue with trying to lower her levothyroxine doses when her TSH was low  In 3/21 on levothyroxine 112 mcg dose her TSH was normal at 0.36 but she was complaining of fatigue She was also concerned about weight gain  She is continuing on a combination of 88 mcg of levothyroxine along with 5 mcg of liothyronine since 11/2019  Overall feels fairly good She takes her supplements consistently before breakfast  TSH is stable and normal ; free T3 is in normal range  Lab Results  Component Value Date   TSH 2.38 01/20/2021   TSH 2.03 06/11/2020   TSH 2.42 01/19/2020   FREET4 1.03 01/20/2021   FREET4 0.94 06/11/2020   FREET4 0.99 01/19/2020   Lab Results  Component Value Date   T3FREE 2.8 01/20/2021   T3FREE 2.9 01/19/2020   T3FREE 3.2 09/27/2018   T3FREE 3.3 05/22/2018     Hypertension:  On lisinopril, followed by PCP, blood pressure higher today from rushing in  BP Readings from Last 3 Encounters:  06/14/20 (!) 142/80   02/02/20 110/64  11/24/19 120/66     Lipids: Has been on atorvastatin 40 mg for some time, prescribed by PCP, LDL is now 75  Lab Results  Component Value Date   CHOL 147 11/20/2019   HDL 45.10 11/20/2019   LDLCALC 76 11/20/2019   LDLDIRECT 75.0 01/18/2021   TRIG 133.0 11/20/2019   CHOLHDL 3 11/20/2019     Has regular eye exams including in 2020 which did not show any retinopathy      Lab on 01/20/2021  Component Date Value Ref Range Status  . T3, Free 01/20/2021 2.8  2.3 - 4.2 pg/mL Final  . Free T4 01/20/2021 1.03  0.60 - 1.60 ng/dL Final   Comment: Specimens from patients who are undergoing biotin therapy and /or ingesting biotin supplements may contain high levels of biotin.  The higher biotin concentration in these specimens interferes with this Free T4 assay.  Specimens that contain high levels  of biotin may cause false high results for this  Free T4 assay.  Please interpret results in light of the total clinical presentation of the patient.    Marland Kitchen TSH 01/20/2021 2.38  0.35 - 4.50 uIU/mL Final  Lab on 01/18/2021  Component Date Value Ref Range Status  . Direct LDL 01/18/2021 75.0  mg/dL Final   Optimal:  <100 mg/dLNear or Above Optimal:  100-129 mg/dLBorderline High:  130-159 mg/dLHigh:  160-189 mg/dLVery High:  >190 mg/dL  . Sodium 01/18/2021 136  135 - 145 mEq/L Final  . Potassium 01/18/2021 4.2  3.5 - 5.1 mEq/L Final  . Chloride 01/18/2021 101  96 - 112 mEq/L Final  . CO2 01/18/2021 28  19 - 32 mEq/L Final  . Glucose, Bld 01/18/2021 250* 70 - 99 mg/dL Final  . BUN 01/18/2021 12  6 - 23 mg/dL Final  . Creatinine, Ser 01/18/2021 0.87  0.40 - 1.20 mg/dL Final  . Total Bilirubin 01/18/2021 0.6  0.2 - 1.2 mg/dL Final  . Alkaline Phosphatase 01/18/2021 124* 39 - 117 U/L Final  . AST 01/18/2021 11  0 - 37 U/L Final  . ALT 01/18/2021 18  0 - 35 U/L Final  . Total Protein 01/18/2021 6.5  6.0 - 8.3 g/dL Final  . Albumin 01/18/2021 4.0  3.5 - 5.2 g/dL Final  . GFR  01/18/2021 74.96  >60.00 mL/min Final   Calculated using the CKD-EPI Creatinine Equation (2021)  . Calcium 01/18/2021 8.8  8.4 - 10.5 mg/dL Final  . Hgb A1c MFr Bld 01/18/2021 7.6* 4.6 - 6.5 % Final   Glycemic Control Guidelines for People with Diabetes:Non Diabetic:  <6%Goal of Therapy: <7%Additional Action Suggested:  >8%      Examination:   Ht 5' 5"  (1.651 m)   Wt 210 lb 6.4 oz (95.4 kg)   BMI 35.01 kg/m   Body mass index is 35.01 kg/m.    Physical Exam   ASSESSMENT/ PLAN:    Diabetes type 2 with obesity:   Her A1c is 7.6 now  See history of present illness for detailed discussion of current diabetes management, blood sugar patterns and problems identified  She is on basal insulin, metformin and Rybelsus  Blood sugars are relatively higher now partly because of not taking [place] recently as well as a couple of weeks being out of Rybelsus Currently only 61% of her readings are within target with inadequate monitoring despite using the CGM  Recommendations:  Go up to at least 20 units on her Tyler Aas, may possibly need 22 units  Discussed that she needs to keep her morning sugars averaging about 120 with higher doses of [place]  She will try to minimize the amount of carbohydrates at meals  Needs to check blood sugars more consistently every day and especially after dinner  Discussed considering mealtime insulin if having consistent spikes after any given meals over 180  Continue same dose of Rybelsus  Needs to consistently add brisk walking up to 10 miles a week   HYPOTHYROIDISM, postsurgical  She has stable thyroid levels and can continue her regimen of 88 mcg levothyroxine and 5 mcg liothyronine  She will continue atorvastatin unchanged   There are no Patient Instructions on file for this visit.      Elayne Snare 01/24/2021, 2:15 PM

## 2021-01-24 NOTE — Patient Instructions (Addendum)
Check blood sugars on waking up 7  days a week  Also check blood sugars about 2 hours after meals daily and do this after different meals by rotation  Recommended blood sugar levels on waking up are 90-130 and about 2 hours after meal is 130-180  Please bring your blood sugar monitor to each visit, thank you  Walk daily  Tresiba 20 at least and keep am sugar < 130

## 2021-01-26 ENCOUNTER — Other Ambulatory Visit: Payer: Self-pay | Admitting: Endocrinology

## 2021-01-26 DIAGNOSIS — E890 Postprocedural hypothyroidism: Secondary | ICD-10-CM

## 2021-02-19 ENCOUNTER — Other Ambulatory Visit: Payer: Self-pay | Admitting: Endocrinology

## 2021-02-19 DIAGNOSIS — E890 Postprocedural hypothyroidism: Secondary | ICD-10-CM

## 2021-03-31 ENCOUNTER — Other Ambulatory Visit: Payer: Self-pay | Admitting: Endocrinology

## 2021-03-31 DIAGNOSIS — Z794 Long term (current) use of insulin: Secondary | ICD-10-CM

## 2021-03-31 DIAGNOSIS — E1165 Type 2 diabetes mellitus with hyperglycemia: Secondary | ICD-10-CM

## 2021-04-26 ENCOUNTER — Other Ambulatory Visit: Payer: Self-pay

## 2021-04-28 ENCOUNTER — Ambulatory Visit: Payer: Self-pay | Admitting: Endocrinology

## 2021-05-23 NOTE — Telephone Encounter (Signed)
Spoke with patient appointments have been rescheduled

## 2021-05-26 ENCOUNTER — Other Ambulatory Visit: Payer: Self-pay

## 2021-06-01 ENCOUNTER — Ambulatory Visit: Payer: Self-pay | Admitting: Endocrinology

## 2021-06-03 ENCOUNTER — Other Ambulatory Visit: Payer: Self-pay

## 2021-06-17 ENCOUNTER — Other Ambulatory Visit: Payer: Self-pay

## 2021-06-17 DIAGNOSIS — Z794 Long term (current) use of insulin: Secondary | ICD-10-CM

## 2021-06-17 DIAGNOSIS — E1165 Type 2 diabetes mellitus with hyperglycemia: Secondary | ICD-10-CM

## 2021-06-17 MED ORDER — RYBELSUS 14 MG PO TABS: 14.0000 mg | ORAL_TABLET | ORAL | 0 refills | Status: DC

## 2021-06-24 ENCOUNTER — Other Ambulatory Visit (INDEPENDENT_AMBULATORY_CARE_PROVIDER_SITE_OTHER): Payer: BC Managed Care – PPO

## 2021-06-24 ENCOUNTER — Other Ambulatory Visit: Payer: Self-pay

## 2021-06-24 DIAGNOSIS — Z794 Long term (current) use of insulin: Secondary | ICD-10-CM

## 2021-06-24 DIAGNOSIS — E1165 Type 2 diabetes mellitus with hyperglycemia: Secondary | ICD-10-CM

## 2021-06-24 DIAGNOSIS — E782 Mixed hyperlipidemia: Secondary | ICD-10-CM

## 2021-06-24 DIAGNOSIS — E890 Postprocedural hypothyroidism: Secondary | ICD-10-CM

## 2021-06-24 LAB — T4, FREE: Free T4: 1.15 ng/dL (ref 0.60–1.60)

## 2021-06-24 LAB — COMPREHENSIVE METABOLIC PANEL
ALT: 22 U/L (ref 0–35)
AST: 15 U/L (ref 0–37)
Albumin: 4.2 g/dL (ref 3.5–5.2)
Alkaline Phosphatase: 112 U/L (ref 39–117)
BUN: 15 mg/dL (ref 6–23)
CO2: 26 mEq/L (ref 19–32)
Calcium: 9.4 mg/dL (ref 8.4–10.5)
Chloride: 100 mEq/L (ref 96–112)
Creatinine, Ser: 0.99 mg/dL (ref 0.40–1.20)
GFR: 64 mL/min (ref >60.00)
Glucose, Bld: 156 mg/dL — ABNORMAL HIGH (ref 70–99)
Potassium: 4.2 mEq/L (ref 3.5–5.1)
Sodium: 137 mEq/L (ref 135–145)
Total Bilirubin: 0.7 mg/dL (ref 0.2–1.2)
Total Protein: 7 g/dL (ref 6.0–8.3)

## 2021-06-24 LAB — LIPID PANEL
Cholesterol: 146 mg/dL (ref 0–200)
HDL: 47.3 mg/dL (ref >39.00)
LDL Cholesterol: 70 mg/dL (ref 0–99)
NonHDL: 98.56
Total CHOL/HDL Ratio: 3
Triglycerides: 145 mg/dL (ref 0.0–149.0)
VLDL: 29 mg/dL (ref 0.0–40.0)

## 2021-06-24 LAB — TSH: TSH: 1.53 u[IU]/mL (ref 0.35–5.50)

## 2021-06-24 LAB — HEMOGLOBIN A1C: Hgb A1c MFr Bld: 7.6 % — ABNORMAL HIGH (ref 4.6–6.5)

## 2021-06-29 ENCOUNTER — Encounter: Payer: Self-pay | Admitting: Endocrinology

## 2021-06-29 ENCOUNTER — Ambulatory Visit (INDEPENDENT_AMBULATORY_CARE_PROVIDER_SITE_OTHER): Payer: BC Managed Care – PPO | Admitting: Endocrinology

## 2021-06-29 ENCOUNTER — Other Ambulatory Visit: Payer: Self-pay

## 2021-06-29 VITALS — BP 138/82 | HR 69 | Ht 64.0 in | Wt 207.2 lb

## 2021-06-29 DIAGNOSIS — E890 Postprocedural hypothyroidism: Secondary | ICD-10-CM

## 2021-06-29 DIAGNOSIS — E782 Mixed hyperlipidemia: Secondary | ICD-10-CM | POA: Diagnosis not present

## 2021-06-29 DIAGNOSIS — E1165 Type 2 diabetes mellitus with hyperglycemia: Secondary | ICD-10-CM

## 2021-06-29 DIAGNOSIS — Z794 Long term (current) use of insulin: Secondary | ICD-10-CM

## 2021-06-29 MED ORDER — TIRZEPATIDE 5 MG/0.5ML ~~LOC~~ SOAJ: 5.0000 mg | SUBCUTANEOUS | 0 refills | Status: DC

## 2021-06-29 MED ORDER — FREESTYLE LIBRE 3 SENSOR MISC: 1.0000 | 3 refills | Status: DC

## 2021-06-29 NOTE — Progress Notes (Signed)
Patient ID: Nicole Crane, female   DOB: 23-Apr-1966, 55 y.o.   MRN: [id number]           Reason for Appointment: Endocrinology follow-up  History of Present Illness    DIABETES type II   Diagnosis date:  About 2005  Previous history: She had gestational diabetes in the past Subsequently she developed diabetes which initially was mild and treated with metformin alone In the past her highest weight has been about 300 pounds She thinks her blood sugars are relatively well controlled for a few years with this and subsequently Amaryl added About 4 years ago for better control was given Bydureon which apparently kept her sugars controlled and she had lost some weight Because of high out-of-pocket expense this was stopped about 2 years ago  Recent history:     Non-insulin hypoglycemic drugs:  Rybelsus 14 mg daily, metformin ER 2 g daily           INSULIN regimen: Tresiba U-100, 18 units once daily  Current diabetes management, blood sugar patterns and problems identified:  Her A1c is 7.6, same as before  She has variable morning readings and did not increase her [place] since some days her morning sugars are fairly good However she appears to be having more postprandial hyperglycemia recently which is somewhat variable At breakfast and mostly higher after dinner She may not eat lunch till 3 PM and occasionally may have high readings because of that Although she thinks she is trying to walk regularly her weight is down only 3 pounds Currently only 64% within target range on her [employer] She thinks this is accurate although at the time of her lab work blood sugar may be relatively lower than the lab glucose of 156  Because of having frequent UTIs she stopped taking Xigduo in early April and was switched to metformin  Side effects from medications:  Visual disturbance from Victoza       Interpretation of her freestyle [employer] shows the following  Overall blood  sugars are high most of the time except early morning and late afternoon  HYPERGLYCEMIC episodes are present to variable extent midmorning peaking at around 10 AM and also rising significantly after dinner peaking around 9 PM  Overnight blood sugars start off averaging 180 and decrease until 6 AM and then rise gradually without hypoglycemia at any time  No hypoglycemia except low normal readings around 4-5 PM sometimes  Also much variability present late afternoon  Postprandial readings are mildly increased compared to Premeal readings at breakfast but usually rising significantly after dinner, data may be incomplete after dinner  Data available for libre between 10/10 and 10/19  CGM use % of 14 days 52  2-week average/GV 165  Time in range     64   %  % Time Above 180 32+4  % Time above 250   % Time Below 70 0     PRE-MEAL Fasting Lunch Dinner Bedtime Overall  Glucose range: 107-189      Averages: 136  145 185    POST-MEAL PC Breakfast PC Lunch PC Dinner  Glucose range:     Averages: 191  192   Prior  CGM use % of time  54  2-week average/GV  176/23  Time in range    61    %  % Time Above 180  33  % Time above 250 6  % Time Below 70      PRE-MEAL Fasting Lunch  Dinner Bedtime Overall  Glucose range:       Averages:  161    198    POST-MEAL PC Breakfast PC Lunch PC Dinner  Glucose range:     Averages:   208  185              Dietician visit: Most recent:     At diagnosis  Weight control:  Wt Readings from Last 3 Encounters:  06/29/21 207 lb 3.2 oz (94 kg)  01/24/21 210 lb 6.4 oz (95.4 kg)  06/14/20 207 lb 9.6 oz (94.2 kg)           Diabetes labs:  Lab Results  Component Value Date   HGBA1C 7.6 (H) 06/24/2021   HGBA1C 7.6 (H) 01/18/2021   HGBA1C 7.2 (H) 06/11/2020   Lab Results  Component Value Date   MICROALBUR <0.7 11/20/2019   [place] 70 06/24/2021   CREATININE 0.99 06/24/2021    Other problems including hypothyroidism: See review of  systems   Allergies as of 06/29/2021       Reactions   Alcohol Hives, Shortness Of Breath, Swelling   Antihistamines, Diphenhydramine-type Hives   Avelox [moxifloxacin Hcl In Nacl] Other (See Comments)   hallucinations.   Dilaudid [hydromorphone Hcl] Hives, Itching   Erythromycin Base Hives   Kiwi Extract Itching   Lips itch   Levofloxacin Other (See Comments)   Hallucinations.   Morphine And Related Hives, Itching   Naproxen Other (See Comments)   Severe acid reflux--possible ulcers   Other    Anything with alcohol that she has to injest.   Penicillins Swelling, Other (See Comments)   Welts Has patient had a PCN reaction causing immediate rash, facial/tongue/throat swelling, SOB or lightheadedness with hypotension: Yes--lips swelling Has patient had a PCN reaction causing severe rash involving mucus membranes or skin necrosis: No Has patient had a PCN reaction that required hospitalization: No Has patient had a PCN reaction occurring within the last 10 years: No If all of the above answers are "NO", then may proceed with Cephalosporin use.   Prednisone Other (See Comments)   Heart races with large doses/high dose.   Pregabalin Swelling, Other (See Comments)   Fluid retention caused weight gained 50 LBS of fluid in ~ 4 days   Statins Other (See Comments)   Muscle/joint aches & pains   Sulfa Antibiotics Nausea And Vomiting   Victoza [liraglutide] Other (See Comments)   Caused visual disturbances (visual impairments)   Xigduo Xr [dapagliflozin-metformin Hcl Er]    Causes UTI's        Medication List        Accurate as of June 29, 2021  8:57 AM. If you have any questions, ask your nurse or doctor.          albuterol 108 (90 Base) MCG/ACT inhaler Commonly known as: VENTOLIN HFA Inhale 1-2 puffs into the lungs every 6 (six) hours as needed for wheezing or shortness of breath.   ALPRAZolam 0.5 MG tablet Commonly known as: XANAX Take 0.5 mg by mouth at  bedtime.   atorvastatin 40 MG tablet Commonly known as: LIPITOR Take 40 mg by mouth every evening.   FreeStyle Libre 2 Reader Kerrin Mo Use to monitor blood sugar   FreeStyle Libre 2 Sensor Misc Use 1 every 14 days   ibuprofen 800 MG tablet Commonly known as: ADVIL Take 800 mg by mouth at bedtime.   Insulin Pen Needle 31G X 5 MM Misc Use to inject insulin 4 times  daily.   levothyroxine 88 MCG tablet Commonly known as: SYNTHROID TAKE ONE TABLET BY MOUTH DAILY; **MUST CALL MD FOR APPOINTMENT FOR FURTHER REFILLS   liothyronine 5 MCG tablet Commonly known as: CYTOMEL TAKE ONE TABLET BY MOUTH DAILY   lisinopril 20 MG tablet Commonly known as: ZESTRIL Take 20 mg by mouth daily.   metFORMIN 500 MG 24 hr tablet Commonly known as: GLUCOPHAGE-XR Take 4 tablets (2,000 mg total) by mouth daily.   OneTouch Delica Lancets 66Z Misc 1 each by Does not apply route 3 (three) times daily. Use Onetouch Delica Lancets to check blood sugar three times daily.   OneTouch Verio Flex System w/Device Kit Use onetouch verio flex to check blood sugar three times daily.   OneTouch Verio test strip Generic drug: glucose blood USE TO CHECK BLOOD SUGAR 3 TIMES DAILY   pantoprazole 40 MG tablet Commonly known as: PROTONIX Take 40 mg by mouth every evening.   Rybelsus 14 MG Tabs Generic drug: Semaglutide Take 14 mg by mouth every morning.   Tyler Aas FlexTouch 100 UNIT/ML FlexTouch Pen Generic drug: insulin degludec INJECT 18 UNITS UNDER THE SKIN DAILY        Allergies:  Allergies  Allergen Reactions   Alcohol Hives, Shortness Of Breath and Swelling   Antihistamines, Diphenhydramine-Type Hives   Avelox [Moxifloxacin Hcl In Nacl] Other (See Comments)    hallucinations.   Dilaudid [Hydromorphone Hcl] Hives and Itching   Erythromycin Base Hives   Kiwi Extract Itching    Lips itch   Levofloxacin Other (See Comments)    Hallucinations.   Morphine And Related Hives and Itching   Naproxen  Other (See Comments)    Severe acid reflux--possible ulcers   Other     Anything with alcohol that she has to injest.   Penicillins Swelling and Other (See Comments)    Welts Has patient had a PCN reaction causing immediate rash, facial/tongue/throat swelling, SOB or lightheadedness with hypotension: Yes--lips swelling Has patient had a PCN reaction causing severe rash involving mucus membranes or skin necrosis: No Has patient had a PCN reaction that required hospitalization: No Has patient had a PCN reaction occurring within the last 10 years: No If all of the above answers are "NO", then may proceed with Cephalosporin use.    Prednisone Other (See Comments)    Heart races with large doses/high dose.   Pregabalin Swelling and Other (See Comments)    Fluid retention caused weight gained 50 LBS of fluid in ~ 4 days   Statins Other (See Comments)    Muscle/joint aches & pains   Sulfa Antibiotics Nausea And Vomiting   Victoza [Liraglutide] Other (See Comments)    Caused visual disturbances (visual impairments)   Xigduo Xr [Dapagliflozin-Metformin Hcl Er]     Causes UTI's    Past Medical History:  Diagnosis Date   Allergic rhinitis    Anxiety    Bilateral leg edema    DDD (degenerative disc disease), lumbosacral    Diabetes mellitus without complication (HCC)    GERD (gastroesophageal reflux disease)    High cholesterol    Hx of blood clots    post op in abdomen after BSO   Hypertension    Hyperthyroidism    Multiple thyroid nodules    OA (osteoarthritis) of shoulder    Spinal stenosis    Tick bite 02/14/2018   [place] tick    Past Surgical History:  Procedure Laterality Date   ABDOMINAL HYSTERECTOMY     complete  BACK SURGERY     BONE MARROW BIOPSY     after BSO to check clotting   CHOLECYSTECTOMY     DILATION AND CURETTAGE OF UTERUS     ENDOMETRIAL ABLATION     EXPLORATORY LAPAROTOMY     BSO   MOUTH SURGERY     ROTATOR CUFF REPAIR     THYROIDECTOMY N/A  02/26/2018   Procedure: TOTAL THYROIDECTOMY;  Surgeon: Armandina Gemma, MD;  Location: [facility];  Service: General;  Laterality: N/A;    Family History  Problem Relation Age of Onset   Diabetes Brother    Breast cancer Mother    Breast cancer Maternal Grandmother    Thyroid disease Neg Hx     Social History:  reports that she has never smoked. She has never used smokeless tobacco. She reports that she does not drink alcohol and does not use drugs.  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  She would previously complain of fatigue with trying to lower her levothyroxine doses when her TSH was low  She is continuing on a combination of 88 mcg of levothyroxine along with 5 mcg of liothyronine since 11/2019 No recent symptoms of fatigue  She takes her supplements consistently before breakfast  TSH is stable and normal ; free T3 is in normal range  Lab Results  Component Value Date   TSH 1.53 06/24/2021   TSH 2.38 01/20/2021   TSH 2.03 06/11/2020   FREET4 1.15 06/24/2021   FREET4 1.03 01/20/2021   FREET4 0.94 06/11/2020   Lab Results  Component Value Date   T3FREE 2.8 01/20/2021   T3FREE 2.9 01/19/2020   T3FREE 3.2 09/27/2018   T3FREE 3.3 05/22/2018     Hypertension:  On lisinopril, followed by PCP also  BP Readings from Last 3 Encounters:  06/29/21 138/82  01/24/21 126/80  06/14/20 (!) 142/80     Lipids: Has been on atorvastatin 40 mg for some time, prescribed by PCP, LDL is now 70 with normal triglycerides  Lab Results  Component Value Date   CHOL 146 06/24/2021   HDL 47.30 06/24/2021   LDLCALC 70 06/24/2021   LDLDIRECT 75.0 01/18/2021   TRIG 145.0 06/24/2021   CHOLHDL 3 06/24/2021             Examination:   BP 138/82   Pulse 69   Ht 5' 4" (1.626 m)   Wt 207 lb 3.2 oz (94 kg)   SpO2 99%   BMI 35.57 kg/m   Body mass index is 35.57 kg/m.    Physical Exam   ASSESSMENT/ PLAN:     Diabetes type 2 with obesity:   Her A1c is 7.6   See history of present illness for detailed discussion of current diabetes management, blood sugar patterns and problems identified  She is on basal insulin, metformin and Rybelsus  Blood sugars are not improved compared to her last visit  She now has more difficulty with postprandial hyperglycemia with her meals most of the time especially dinner  Previously had done better with [place] which she cannot tolerate Also still has difficulty losing weight She feels that she is oversensitive to mealtime insulin  Recommendations: Trial of MOUNJARO instead of Rybelsus  Discussed differences between Rybelsus and Mounjaro and mode of action as well as greater efficacy especially with weight loss  Shoulder the injection device and how this would be used and also discussed dosage titration on a monthly basis  She  will try a 5 mg weekly dose since she already has been on Rybelsus maximum dose Rybelsus will be stopped When she finishes 5 mg dose and if tolerated go up to 7.5 mg weekly Discussed needing to moderate on carbohydrate portions to avoid high readings after meals  If she has any nausea she will let us know and may need to switch to mealtime insulin Needs to check blood sugars more consistently after dinner Discussed considering mealtime insulin if having consistent spikes after any given meals over 180 Continue same dose of metformin and Tresiba for now May need to cut back on Tresiba if morning sugars start getting lower She can try the [place] 3 sensor if covered by insurance and availability pharmacy  HYPOTHYROIDISM, postsurgical  She has continued normal thyroid levels with her regimen of 88 mcg levothyroxine and 5 mcg liothyronine  Lipids well controlled   There are no Patient Instructions on file for this visit.      Elayne Snare 06/29/2021, 8:57 AM

## 2021-07-01 ENCOUNTER — Other Ambulatory Visit (HOSPITAL_COMMUNITY): Payer: Self-pay

## 2021-07-01 ENCOUNTER — Telehealth: Payer: Self-pay | Admitting: Pharmacy Technician

## 2021-07-01 NOTE — Telephone Encounter (Signed)
Patient Advocate Encounter   Received notification from [facility]MyMeds/ [facility] that prior authorization for [facility] 5mg  is required.   Key [id number]  Ins requires clinical explanation for treatment failure(s), intolerance and/or contraindication (whichever are applicable) for each formulary medication. Formulary meds are Ozempic, Rybelsus, Trulicity and Victoza. I see that she's  been on Rybelsus and has an allergy to Victoza. She didn't continue Trulicity in 2020 due to ins not covering.  Could we try Trulicity again, or Ozempic? I didn't find anything in her file about Ozempic. Trulicity copay is $25 for a month. Ozempic is 24.99, but if she can use the copay card she should be able to get a 3 month supply for the same price.    2021, CPhT Patient Advocate [facility] Phone: [phone] Fax:  [phone]

## 2021-07-05 ENCOUNTER — Other Ambulatory Visit (HOSPITAL_COMMUNITY): Payer: Self-pay

## 2021-07-05 NOTE — Telephone Encounter (Signed)
Patient Advocate Encounter   Called [facility]. They have the [facility] 5mg  ready for pick up. $25

## 2021-08-06 ENCOUNTER — Other Ambulatory Visit: Payer: Self-pay | Admitting: Endocrinology

## 2021-09-02 ENCOUNTER — Other Ambulatory Visit: Payer: Self-pay | Admitting: Endocrinology

## 2021-09-06 ENCOUNTER — Other Ambulatory Visit: Payer: Self-pay | Admitting: Endocrinology

## 2021-09-06 DIAGNOSIS — Z794 Long term (current) use of insulin: Secondary | ICD-10-CM

## 2021-09-07 MED ORDER — TRESIBA FLEXTOUCH 100 UNIT/ML ~~LOC~~ SOPN: PEN_INJECTOR | SUBCUTANEOUS | 0 refills | Status: DC

## 2021-09-27 ENCOUNTER — Other Ambulatory Visit: Payer: BC Managed Care – PPO

## 2021-09-29 IMAGING — MG DIGITAL SCREENING BILAT W/ TOMO W/ CAD
6 of 10 series · 6 of 30 positions shown · non-contrast
Comparison: Previous exam(s).

ACR Breast Density Category a: The breast tissue is almost entirely
fatty.

CLINICAL DATA: Screening.

EXAM:
DIGITAL SCREENING BILATERAL MAMMOGRAM WITH TOMO AND CAD

[R CV synth-2D]
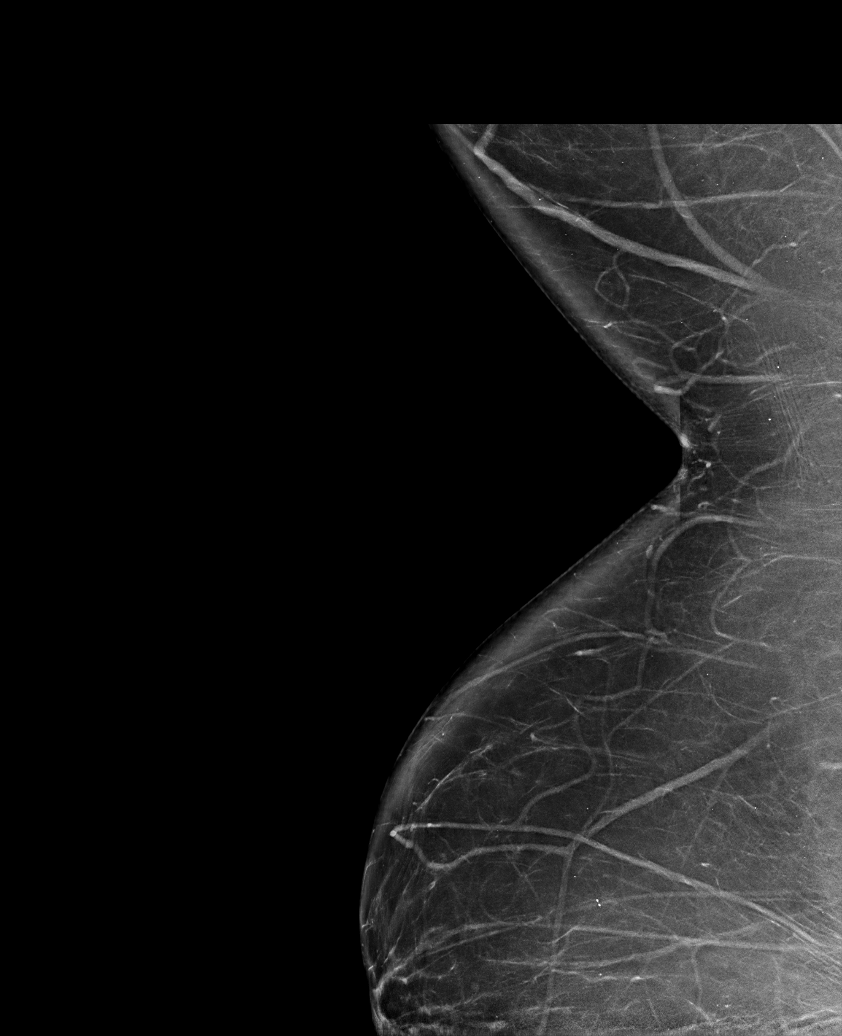

[R CC synth-2D]
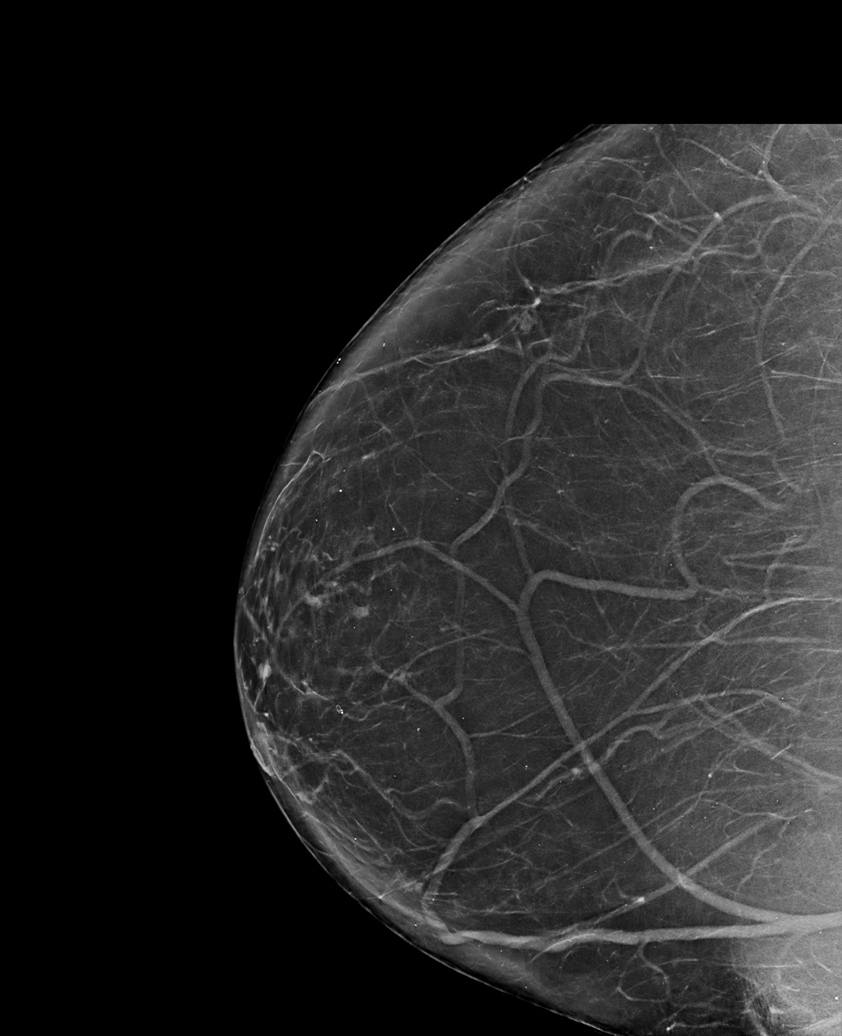

[L MLO synth-2D]
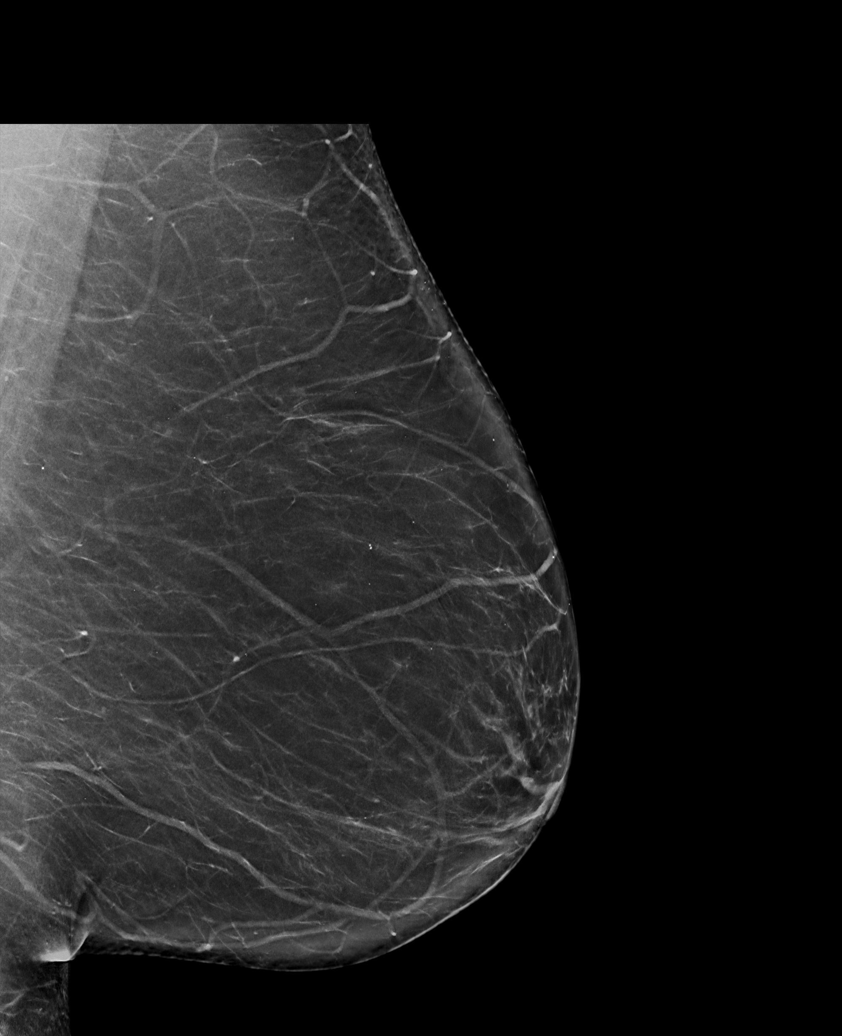

[L CC synth-2D]
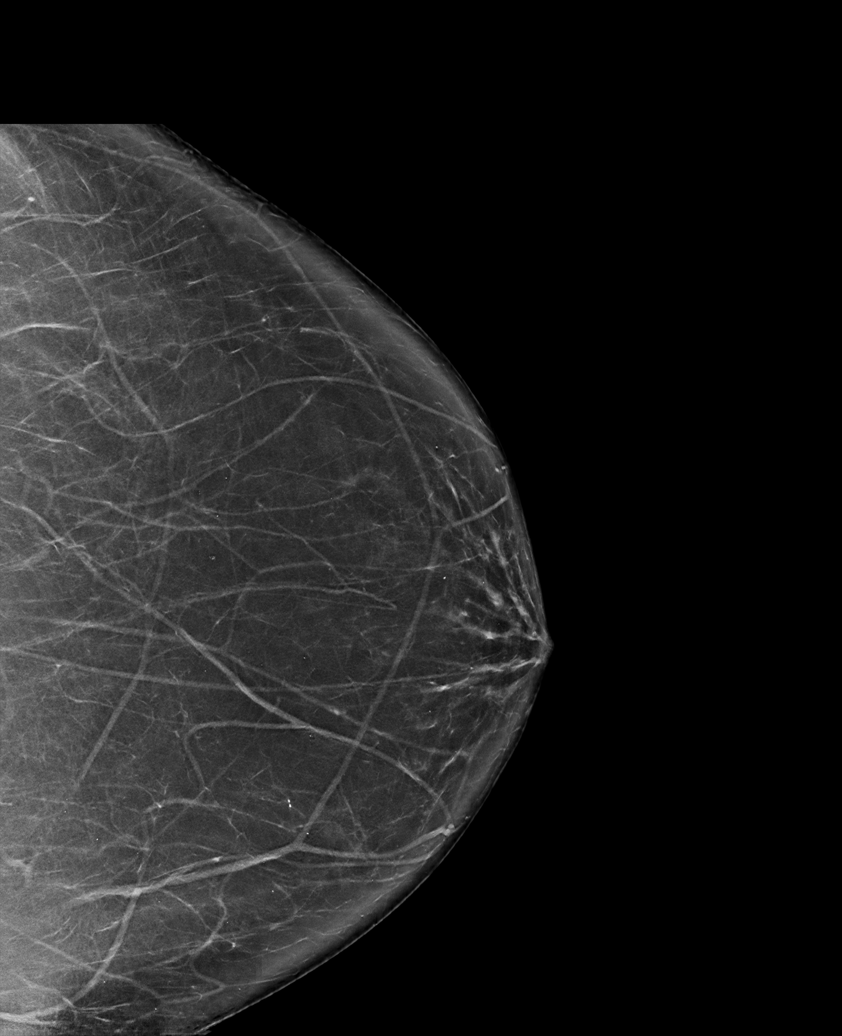

[R MLO synth-2D]
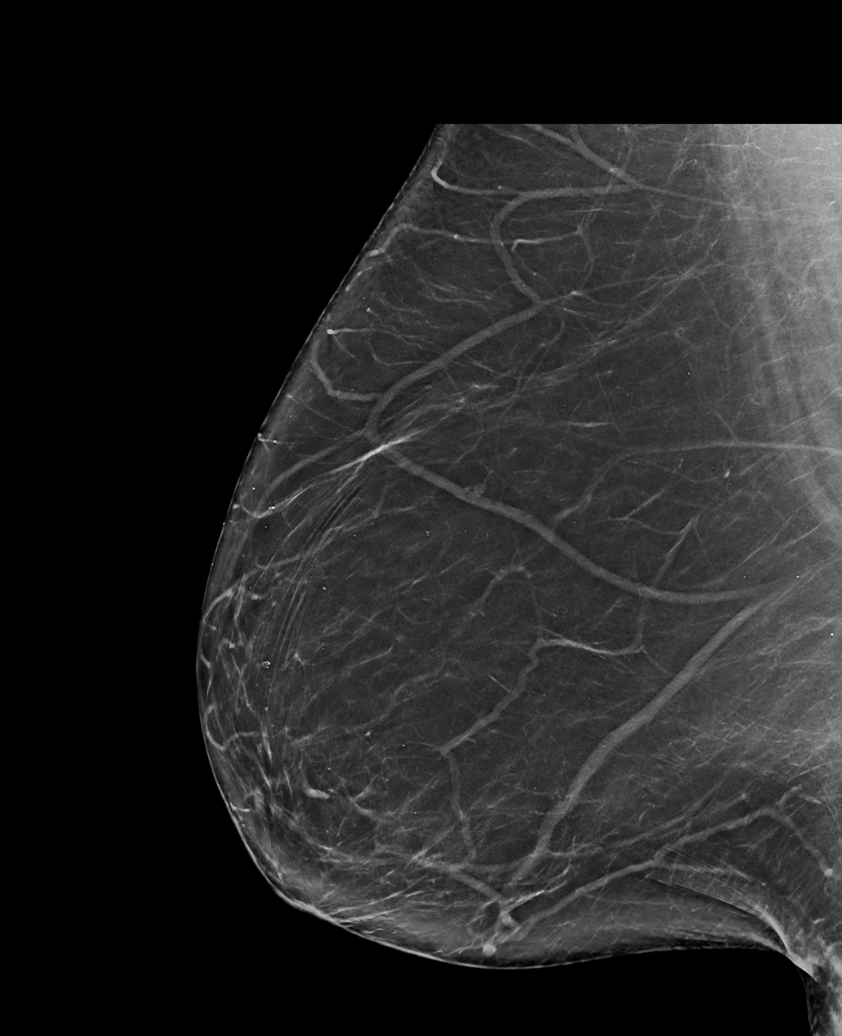

[L CC tomo · tomo slice 40/79.0]
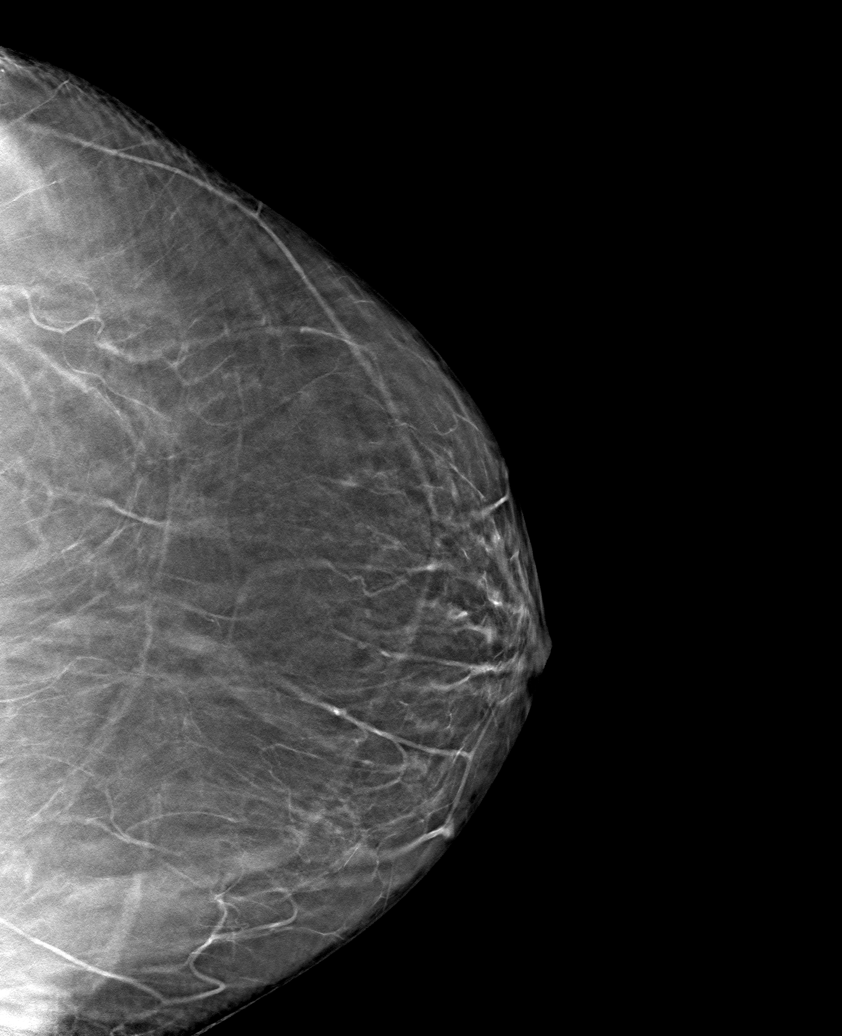

[6 of 30 positions shown; findings below may reference images not displayed]

FINDINGS: There are no findings suspicious for malignancy. Images were
processed with CAD.
IMPRESSION: No mammographic evidence of malignancy. A result letter of this
screening mammogram will be mailed directly to the patient.

RECOMMENDATION:
Screening mammogram in one year. (Code:8Y-[id number])

BI-RADS CATEGORY  1: Negative.

## 2021-09-30 ENCOUNTER — Ambulatory Visit: Payer: BC Managed Care – PPO | Admitting: Endocrinology

## 2021-10-01 ENCOUNTER — Other Ambulatory Visit: Payer: Self-pay | Admitting: Endocrinology

## 2021-10-16 ENCOUNTER — Encounter: Payer: Self-pay | Admitting: Endocrinology

## 2021-10-18 ENCOUNTER — Other Ambulatory Visit: Payer: BC Managed Care – PPO

## 2021-10-20 ENCOUNTER — Ambulatory Visit: Payer: BC Managed Care – PPO | Admitting: Endocrinology

## 2021-11-01 ENCOUNTER — Other Ambulatory Visit (INDEPENDENT_AMBULATORY_CARE_PROVIDER_SITE_OTHER): Payer: BC Managed Care – PPO

## 2021-11-01 ENCOUNTER — Other Ambulatory Visit: Payer: Self-pay

## 2021-11-01 DIAGNOSIS — Z794 Long term (current) use of insulin: Secondary | ICD-10-CM | POA: Diagnosis not present

## 2021-11-01 DIAGNOSIS — E1165 Type 2 diabetes mellitus with hyperglycemia: Secondary | ICD-10-CM | POA: Diagnosis not present

## 2021-11-01 LAB — BASIC METABOLIC PANEL
BUN: 11 mg/dL (ref 6–23)
CO2: 30 mEq/L (ref 19–32)
Calcium: 9 mg/dL (ref 8.4–10.5)
Chloride: 99 mEq/L (ref 96–112)
Creatinine, Ser: 0.97 mg/dL (ref 0.40–1.20)
GFR: 65.42 mL/min (ref >60.00)
Glucose, Bld: 126 mg/dL — ABNORMAL HIGH (ref 70–99)
Potassium: 4 mEq/L (ref 3.5–5.1)
Sodium: 136 mEq/L (ref 135–145)

## 2021-11-01 LAB — HEMOGLOBIN A1C: Hgb A1c MFr Bld: 6.2 % (ref 4.6–6.5)

## 2021-11-05 ENCOUNTER — Other Ambulatory Visit: Payer: Self-pay

## 2021-11-05 ENCOUNTER — Encounter: Payer: Self-pay | Admitting: Emergency Medicine

## 2021-11-05 ENCOUNTER — Ambulatory Visit
Admission: EM | Admit: 2021-11-05 | Discharge: 2021-11-05 | Disposition: A | Discharge status: Home / Self Care | Payer: BC Managed Care – PPO | Attending: Family Medicine | Admitting: Family Medicine

## 2021-11-05 DIAGNOSIS — Z1152 Encounter for screening for COVID-19: Secondary | ICD-10-CM | POA: Diagnosis not present

## 2021-11-05 DIAGNOSIS — J039 Acute tonsillitis, unspecified: Secondary | ICD-10-CM | POA: Insufficient documentation

## 2021-11-05 DIAGNOSIS — R509 Fever, unspecified: Secondary | ICD-10-CM | POA: Diagnosis present

## 2021-11-05 DIAGNOSIS — J029 Acute pharyngitis, unspecified: Secondary | ICD-10-CM

## 2021-11-05 LAB — POCT RAPID STREP A (OFFICE): Rapid Strep A Screen: NEGATIVE

## 2021-11-05 MED ORDER — LIDOCAINE VISCOUS HCL 2 % MT SOLN: 10.0000 mL | OROMUCOSAL | 0 refills | Status: DC | PRN

## 2021-11-05 MED ORDER — AZITHROMYCIN 250 MG PO TABS: ORAL_TABLET | ORAL | 0 refills | Status: DC

## 2021-11-05 NOTE — ED Triage Notes (Signed)
Pt reports generalized body aches, fatigue, headache, chills, sore throat since Wednesday. Pt reports highest fever 101 at home. Last dose of tylenol at 1130 this am.

## 2021-11-07 LAB — COVID-19, FLU A+B NAA
Influenza A, NAA: NOT DETECTED
Influenza B, NAA: NOT DETECTED
SARS-CoV-2, NAA: NOT DETECTED

## 2021-11-09 NOTE — ED Provider Notes (Signed)
[facility]    CSN: [id number] Arrival date & time: 11/05/21  1319      History   Chief Complaint Chief Complaint  Patient presents with   Fatigue    HPI Nicole Crane is a 56 y.o. female.   Pt reports generalized body aches, fatigue, headache, chills, sore throat since Wednesday. Pt reports highest fever 101 at home. Last dose of tylenol at 1130 this am.  Presenting today with 3-day history of progressively worsening fatigue, body aches, chills, fever, sore, swollen throat.  Denies cough, chest pain, shortness of breath, abdominal pain, nausea vomiting or diarrhea.  So far trying fever reducers with only mild temporary relief of the fever.  No known sick contacts recently.     Past Medical History:  Diagnosis Date   Allergic rhinitis    Anxiety    Bilateral leg edema    DDD (degenerative disc disease), lumbosacral    Diabetes mellitus without complication (HCC)    GERD (gastroesophageal reflux disease)    High cholesterol    Hx of blood clots    post op in abdomen after BSO   Hypertension    Hyperthyroidism    Multiple thyroid nodules    OA (osteoarthritis) of shoulder    Spinal stenosis    Tick bite 02/14/2018   [place] tick    Patient Active Problem List   Diagnosis Date Noted   Uncontrolled type 2 diabetes mellitus with hyperglycemia, without long-term current use of insulin ([place]) 06/14/2020   Post-surgical hypothyroidism 04/24/2018   Multiple thyroid nodules 02/25/2018   Hyperthyroidism 02/25/2018    Past Surgical History:  Procedure Laterality Date   ABDOMINAL HYSTERECTOMY     complete   BACK SURGERY     BONE MARROW BIOPSY     after BSO to check clotting   CHOLECYSTECTOMY     DILATION AND CURETTAGE OF UTERUS     ENDOMETRIAL ABLATION     EXPLORATORY LAPAROTOMY     BSO   MOUTH SURGERY     ROTATOR CUFF REPAIR     THYROIDECTOMY N/A 02/26/2018   Procedure: TOTAL THYROIDECTOMY;  Surgeon: Armandina Gemma, MD;  Location: [facility];   Service: General;  Laterality: N/A;    OB History   No obstetric history on file.      Home Medications      Family History Family History  Problem Relation Age of Onset   Diabetes Brother    Breast cancer Mother    Breast cancer Maternal Grandmother    Thyroid disease Neg Hx     Social History Social History   Tobacco Use   Smoking status: Never   Smokeless tobacco: Never  Vaping Use   Vaping Use: Never used  Substance Use Topics   Alcohol use: No   Drug use: No     Allergies   Alcohol; Antihistamines, diphenhydramine-type; Avelox [moxifloxacin hcl in nacl]; Dilaudid [hydromorphone hcl]; Erythromycin base; Kiwi extract; Levofloxacin; Morphine and related; Naproxen; Other; Penicillins; Prednisone; Pregabalin; Statins; Sulfa antibiotics; Victoza [liraglutide]; and Xigduo xr [dapagliflozin-metformin hcl er]   Review of Systems Review of Systems Per HPI  Physical Exam Triage Vital Signs ED Triage Vitals [11/05/21 1525]  Enc Vitals Group     BP (!) 164/90     Pulse Rate (!) 110     Resp 18     Temp 98.8 F (37.1 C)     Temp Source Oral     SpO2 99 %     Weight  198 lb (89.8 kg)     Height 5' 5"  (1.651 m)     Head Circumference      Peak Flow      Pain Score 8     Pain Loc      Pain Edu?      Excl. in [place]?    No data found.  Updated Vital Signs BP (!) 164/90 (BP Location: Right Arm)    Pulse (!) 110    Temp 98.8 F (37.1 C) (Oral)    Resp 18    Ht 5' 5"  (1.651 m)    Wt 198 lb (89.8 kg)    SpO2 99%    BMI 32.95 kg/m   Visual Acuity Right Eye Distance:   Left Eye Distance:   Bilateral Distance:    Right Eye Near:   Left Eye Near:    Bilateral Near:     Physical Exam Vitals and nursing note reviewed.  Constitutional:      Appearance: Normal appearance.  HENT:     Head: Atraumatic.     Right Ear: Tympanic membrane and external ear normal.     Left Ear: Tympanic membrane and external ear normal.     Nose: Nose normal.     Mouth/Throat:      Mouth: Mucous membranes are moist.     Pharynx: Posterior oropharyngeal erythema present.     Comments: Bilateral tonsillar edema, erythema, uvula midline, oral airway patent Eyes:     Extraocular Movements: Extraocular movements intact.     Conjunctiva/sclera: Conjunctivae normal.  Cardiovascular:     Rate and Rhythm: Normal rate and regular rhythm.     Heart sounds: Normal heart sounds.  Pulmonary:     Effort: Pulmonary effort is normal.     Breath sounds: Normal breath sounds. No wheezing.  Musculoskeletal:        General: Normal range of motion.     Cervical back: Normal range of motion and neck supple.  Skin:    General: Skin is warm and dry.  Neurological:     Mental Status: She is alert and oriented to person, place, and time.  Psychiatric:        Mood and Affect: Mood normal.        Thought Content: Thought content normal.   UC Treatments / Results  Labs (all labs ordered are listed, but only abnormal results are displayed) Labs Reviewed  COVID-19, FLU A+B NAA   Narrative:    Performed at:  [address], [place], [place]  [id number] Lab Director: Rush Farmer MD, Phone:  [phone]  CULTURE, GROUP A STREP [facility])  POCT RAPID STREP A (OFFICE)   EKG   Radiology No results found.  Procedures Procedures (including critical care time)  Medications Ordered in UC Medications - No data to display  Initial Impression / Assessment and Plan / UC Course  I have reviewed the triage vital signs and the nursing notes.  Pertinent labs & imaging results that were available during my care of the patient were reviewed by me and considered in my medical decision making (see chart for details).     Tachycardic in triage, otherwise vital signs reassuring.  Rapid strep negative, throat culture and COVID and flu testing pending.  Suspect tonsillitis.  Treat with azithromycin, lidocaine while awaiting results.  Discussed supportive care and return  precautions.  Work note given.  Final Clinical Impressions(s) / UC Diagnoses   Final diagnoses:  Encounter for screening for  COVID-19  Sore throat  Acute tonsillitis, unspecified etiology  Fever, unspecified   Discharge Instructions   None    ED Prescriptions     Medication Sig Dispense Auth. Provider   azithromycin (ZITHROMAX) 250 MG tablet Take first 2 tablets together, then 1 every day until finished. 6 tablet Volney American, PA-C   lidocaine (XYLOCAINE) 2 % solution Use as directed 10 mLs in the mouth or throat every 3 (three) hours as needed for mouth pain. 100 mL Volney American, [place]      PDMP not reviewed this encounter.   Volney American, [place] 11/09/21 1749

## 2021-11-10 LAB — CULTURE, GROUP A STREP (THRC)

## 2021-11-14 ENCOUNTER — Encounter: Payer: Self-pay | Admitting: Endocrinology

## 2021-11-14 ENCOUNTER — Other Ambulatory Visit: Payer: Self-pay

## 2021-11-14 ENCOUNTER — Ambulatory Visit (INDEPENDENT_AMBULATORY_CARE_PROVIDER_SITE_OTHER): Payer: BC Managed Care – PPO | Admitting: Endocrinology

## 2021-11-14 VITALS — BP 122/72 | HR 92 | Ht 65.0 in | Wt 194.4 lb

## 2021-11-14 DIAGNOSIS — E1169 Type 2 diabetes mellitus with other specified complication: Secondary | ICD-10-CM | POA: Diagnosis not present

## 2021-11-14 DIAGNOSIS — I10 Essential (primary) hypertension: Secondary | ICD-10-CM | POA: Diagnosis not present

## 2021-11-14 DIAGNOSIS — E890 Postprocedural hypothyroidism: Secondary | ICD-10-CM

## 2021-11-14 DIAGNOSIS — E669 Obesity, unspecified: Secondary | ICD-10-CM | POA: Diagnosis not present

## 2021-11-14 MED ORDER — MOUNJARO 5 MG/0.5ML ~~LOC~~ SOAJ: SUBCUTANEOUS | 2 refills | Status: DC

## 2021-11-14 NOTE — Patient Instructions (Signed)
Take 1/2 Lisinopril 

## 2021-11-14 NOTE — Progress Notes (Signed)
Patient ID: Nicole Crane, female   DOB: [birth year], 56 y.o.   MRN: [id number]           Reason for Appointment: Endocrinology follow-up  History of Present Illness    DIABETES type II   Diagnosis date:  About 2005  Previous history: She had gestational diabetes in the past Subsequently she developed diabetes which initially was mild and treated with metformin alone In the past her highest weight has been about 300 pounds She thinks her blood sugars are relatively well controlled for a few years with this and subsequently Amaryl added About 4 years ago for better control was given Bydureon which apparently kept her sugars controlled and she had lost some weight Because of high out-of-pocket expense this was stopped about 2 years ago  Recent history:     Non-insulin hypoglycemic drugs: Mounjaro 5 mg weekly, metformin ER 2 g daily           INSULIN regimen: none  Current diabetes management, blood sugar patterns and problems identified:  Her A1c is much better at 6.2 compared to 7.6  She did not come back for follow-up after starting Mounjaro in October  She has had progressively lower morning readings and she thinks she was able to cut back on her Tyler Aas and stopped this about 3 weeks ago  Subsequently her morning sugars are still fairly good in the 120s and 130s  She is also able to cut back on her portions Has lost 13 pounds  Has had no nausea with Mounjaro which she takes weekly, previously was on Rybelsus  Also now using the [place] 3 which she finds it fairly accurate  Only rarely will have a higher postprandial reading after lunch or dinner if she is eating more carbohydrate  In the morning she is generally eating [place] yogurt for breakfast  Overall cutting back on carbohydrates also  Also she has been able to walk regularly either on the treadmill or outside Going for walks 3 days a week or more   Side effects from medications:  Visual disturbance from Victoza,  possibly UTI from [facility] shows fairly consistent patterns with minimal variability highest AVERAGE GLUCOSE 148 late morning  CGM use % of time 76  2-week average/GV 129  Time in range     96   %, was 64  % Time Above 180 4  % Time above 250   % Time Below 70 0     PRE-MEAL Fasting Lunch Dinner Bedtime Overall  Glucose range:       Averages: 124       POST-MEAL PC Breakfast PC Lunch PC Dinner  Glucose range:     Averages: 140 137 137    PREVIOUSLY:  CGM use % of 14 days 52  2-week average/GV 165  Time in range     64   %  % Time Above 180 32+4  % Time above 250   % Time Below 70 0     PRE-MEAL Fasting Lunch Dinner Bedtime Overall  Glucose range: 107-189      Averages: 136  145 185    POST-MEAL PC Breakfast PC Lunch PC Dinner  Glucose range:     Averages: 191  192              Dietician visit: Most recent:     At diagnosis  Weight control:  Wt Readings from Last 3 Encounters:  11/14/21 194  lb 6.4 oz (88.2 kg)  11/05/21 198 lb (89.8 kg)  06/29/21 207 lb 3.2 oz (94 kg)           Diabetes labs:  Lab Results  Component Value Date   HGBA1C 6.2 11/01/2021   HGBA1C 7.6 (H) 06/24/2021   HGBA1C 7.6 (H) 01/18/2021   Lab Results  Component Value Date   MICROALBUR <0.7 11/20/2019   LDLCALC 70 06/24/2021   CREATININE 0.97 11/01/2021    Other problems including hypothyroidism: See review of systems   Allergies as of 11/14/2021       Reactions   Alcohol Hives, Shortness Of Breath, Swelling   Antihistamines, Diphenhydramine-type Hives   Avelox [moxifloxacin Hcl In Nacl] Other (See Comments)   hallucinations.   Dilaudid [hydromorphone Hcl] Hives, Itching   Erythromycin Base Hives   Kiwi Extract Itching   Lips itch   Levofloxacin Other (See Comments)   Hallucinations.   Morphine And Related Hives, Itching   Naproxen Other (See Comments)   Severe acid reflux--possible ulcers   Other    Anything with alcohol that she has to injest.    Penicillins Swelling, Other (See Comments)   Welts Has patient had a PCN reaction causing immediate rash, facial/tongue/throat swelling, SOB or lightheadedness with hypotension: Yes--lips swelling Has patient had a PCN reaction causing severe rash involving mucus membranes or skin necrosis: No Has patient had a PCN reaction that required hospitalization: No Has patient had a PCN reaction occurring within the last 10 years: No If all of the above answers are "NO", then may proceed with Cephalosporin use.   Prednisone Other (See Comments)   Heart races with large doses/high dose.   Pregabalin Swelling, Other (See Comments)   Fluid retention caused weight gained 50 LBS of fluid in ~ 4 days   Statins Other (See Comments)   Muscle/joint aches & pains   Sulfa Antibiotics Nausea And Vomiting   Victoza [liraglutide] Other (See Comments)   Caused visual disturbances (visual impairments)   Xigduo Xr [dapagliflozin-metformin Hcl Er]    Causes UTI's        Medication List        Accurate as of November 14, 2021  2:29 PM. If you have any questions, ask your nurse or doctor.          STOP taking these medications    azithromycin 250 MG tablet Commonly known as: ZITHROMAX Stopped by: Elayne Snare, MD   FreeStyle Libre 2 Reader Margretta Sidle by: Elayne Snare, MD   Tyler Aas FlexTouch 100 UNIT/ML FlexTouch Pen Generic drug: insulin degludec Stopped by: Elayne Snare, MD       TAKE these medications    albuterol 108 (90 Base) MCG/ACT inhaler Commonly known as: VENTOLIN HFA Inhale 1-2 puffs into the lungs every 6 (six) hours as needed for wheezing or shortness of breath.   ALPRAZolam 0.5 MG tablet Commonly known as: XANAX Take 0.5 mg by mouth at bedtime.   atorvastatin 40 MG tablet Commonly known as: LIPITOR Take 40 mg by mouth every evening.   FreeStyle Libre 3 Sensor Misc 1 each by Does not apply route every 14 (fourteen) days. What changed: Another medication with the same name  was removed. Continue taking this medication, and follow the directions you see here. Changed by: Elayne Snare, MD   ibuprofen 800 MG tablet Commonly known as: ADVIL Take 800 mg by mouth at bedtime.   Insulin Pen Needle 31G X 5 MM Misc Use to inject insulin 4 times  daily.   levothyroxine 88 MCG tablet Commonly known as: SYNTHROID TAKE ONE TABLET BY MOUTH DAILY; **MUST CALL MD FOR APPOINTMENT FOR FURTHER REFILLS   lidocaine 2 % solution Commonly known as: XYLOCAINE Use as directed 10 mLs in the mouth or throat every 3 (three) hours as needed for mouth pain.   liothyronine 5 MCG tablet Commonly known as: CYTOMEL TAKE ONE TABLET BY MOUTH DAILY   lisinopril 20 MG tablet Commonly known as: ZESTRIL Take 20 mg by mouth daily.   metFORMIN 500 MG 24 hr tablet Commonly known as: GLUCOPHAGE-XR Take 4 tablets (2,000 mg total) by mouth daily.   Mounjaro 5 MG/0.5ML Pen Generic drug: tirzepatide INJECT  (0.5ML) 5 MG UNDER THE SKIN ONCE WEEKLY What changed: See the new instructions. Changed by: Elayne Snare, MD   OneTouch Delica Lancets 78L Misc 1 each by Does not apply route 3 (three) times daily. Use Onetouch Delica Lancets to check blood sugar three times daily.   OneTouch Verio Flex System w/Device Kit Use onetouch verio flex to check blood sugar three times daily.   OneTouch Verio test strip Generic drug: glucose blood USE TO CHECK BLOOD SUGAR 3 TIMES DAILY   pantoprazole 40 MG tablet Commonly known as: PROTONIX Take 40 mg by mouth every evening.        Allergies:  Allergies  Allergen Reactions   Alcohol Hives, Shortness Of Breath and Swelling   Antihistamines, Diphenhydramine-Type Hives   Avelox [Moxifloxacin Hcl In Nacl] Other (See Comments)    hallucinations.   Dilaudid [Hydromorphone Hcl] Hives and Itching   Erythromycin Base Hives   Kiwi Extract Itching    Lips itch   Levofloxacin Other (See Comments)    Hallucinations.   Morphine And Related Hives and Itching    Naproxen Other (See Comments)    Severe acid reflux--possible ulcers   Other     Anything with alcohol that she has to injest.   Penicillins Swelling and Other (See Comments)    Welts Has patient had a PCN reaction causing immediate rash, facial/tongue/throat swelling, SOB or lightheadedness with hypotension: Yes--lips swelling Has patient had a PCN reaction causing severe rash involving mucus membranes or skin necrosis: No Has patient had a PCN reaction that required hospitalization: No Has patient had a PCN reaction occurring within the last 10 years: No If all of the above answers are "NO", then may proceed with Cephalosporin use.    Prednisone Other (See Comments)    Heart races with large doses/high dose.   Pregabalin Swelling and Other (See Comments)    Fluid retention caused weight gained 50 LBS of fluid in ~ 4 days   Statins Other (See Comments)    Muscle/joint aches & pains   Sulfa Antibiotics Nausea And Vomiting   Victoza [Liraglutide] Other (See Comments)    Caused visual disturbances (visual impairments)   Xigduo Xr [Dapagliflozin-Metformin Hcl Er]     Causes UTI's    Past Medical History:  Diagnosis Date   Allergic rhinitis    Anxiety    Bilateral leg edema    DDD (degenerative disc disease), lumbosacral    Diabetes mellitus without complication (HCC)    GERD (gastroesophageal reflux disease)    High cholesterol    Hx of blood clots    post op in abdomen after BSO   Hypertension    Hyperthyroidism    Multiple thyroid nodules    OA (osteoarthritis) of shoulder    Spinal stenosis    Tick bite 02/14/2018  [place] tick    Past Surgical History:  Procedure Laterality Date   ABDOMINAL HYSTERECTOMY     complete   BACK SURGERY     BONE MARROW BIOPSY     after BSO to check clotting   CHOLECYSTECTOMY     DILATION AND CURETTAGE OF UTERUS     ENDOMETRIAL ABLATION     EXPLORATORY LAPAROTOMY     BSO   MOUTH SURGERY     ROTATOR CUFF REPAIR      THYROIDECTOMY N/A 02/26/2018   Procedure: TOTAL THYROIDECTOMY;  Surgeon: Armandina Gemma, MD;  Location: [facility];  Service: General;  Laterality: N/A;    Family History  Problem Relation Age of Onset   Diabetes Brother    Breast cancer Mother    Breast cancer Maternal Grandmother    Thyroid disease Neg Hx     Social History:  reports that she has never smoked. She has never used smokeless tobacco. She reports that she does not drink alcohol and does not use drugs.  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  She would previously complain of fatigue with trying to lower her levothyroxine doses when her TSH was low  She is continuing on a combination of 88 mcg of levothyroxine along with 5 mcg of liothyronine since 11/2019 She feels fairly good  She takes her supplements consistently before breakfast  TSH is stable and normal ; free T3 is in normal range  Lab Results  Component Value Date   TSH 1.53 06/24/2021   TSH 2.38 01/20/2021   TSH 2.03 06/11/2020   FREET4 1.15 06/24/2021   FREET4 1.03 01/20/2021   FREET4 0.94 06/11/2020   Lab Results  Component Value Date   T3FREE 2.8 01/20/2021   T3FREE 2.9 01/19/2020   T3FREE 3.2 09/27/2018   T3FREE 3.3 05/22/2018     Hypertension:  On lisinopril, followed by PCP also  Home blood pressure recently reading 116/67  BP Readings from Last 3 Encounters:  11/14/21 122/72  11/05/21 (!) 164/90  06/29/21 138/82     Lipids: Has been on atorvastatin 40 mg for some time, prescribed by PCP, LDL is last 70 with normal triglycerides  Lab Results  Component Value Date   CHOL 146 06/24/2021   HDL 47.30 06/24/2021   LDLCALC 70 06/24/2021   LDLDIRECT 75.0 01/18/2021   TRIG 145.0 06/24/2021   CHOLHDL 3 06/24/2021     Physical Exam  BP 122/72    Pulse 92    Ht 5' 5"  (1.651 m)    Wt 194 lb 6.4 oz (88.2 kg)    SpO2 98%    BMI 32.35 kg/m    ASSESSMENT/ PLAN:    Diabetes  type 2 with obesity:   Her A1c is 6.2 compared to 7.6   See history of present illness for detailed discussion of current diabetes management, blood sugar patterns and problems identified  She is on  metformin and MOUNJARO 5 mg  Blood sugars are significantly improved compared to her last visit  She has benefited significantly from [facility] compared to Rybelsus but also has changed her diet significantly as well as trying to walk regularly This has helped her with weight loss as well as her postprandial hyperglycemia Also recently not taking any basal insulin which she was able to taper off  She recently has overall average blood sugar on her sensor off 129 with only rarely high postprandial readings   Recommendations: Continue same  dose of 5 mg Mounjaro Continue metformin and discussed this is complementary to [facility] May continue to use the freestyle libre  HYPOTHYROIDISM, postsurgical  Will need labs on the next visit  Hypertension: Blood pressure is low normal and she can reduce lisinopril to half tablet Also periodically monitor her pulse at home  Patient Instructions  Take 1/2 Lisinopril      Elayne Snare 11/14/2021, 2:29 PM

## 2021-12-20 ENCOUNTER — Ambulatory Visit (AMBULATORY_SURGERY_CENTER): Payer: BC Managed Care – PPO | Admitting: *Deleted

## 2021-12-20 VITALS — Ht 65.0 in | Wt 194.0 lb

## 2021-12-20 DIAGNOSIS — Z1211 Encounter for screening for malignant neoplasm of colon: Secondary | ICD-10-CM

## 2021-12-20 MED ORDER — PEG 3350-KCL-NA BICARB-NACL 420 G PO SOLR: 4000.0000 mL | Freq: Once | ORAL | 0 refills | Status: AC

## 2021-12-20 NOTE — Progress Notes (Signed)
Patient's pre-visit was done today over the phone with the patient. Name,DOB and address verified. Patient denies any allergies to Eggs and Soy. Patient denies any problems with anesthesia/sedation. Patient is not taking any diet pills or blood thinners. No home Oxygen. Insurance confirmed with patient. ? ?Prep instructions sent to pt's MyChart -pt is aware. Patient understands to call us back with any questions or concerns. Patient is aware of our care-partner policy.  ? ?EMMI education assigned to the patient for the procedure, sent to MyChart.  ?

## 2021-12-23 ENCOUNTER — Encounter: Payer: Self-pay | Admitting: Gastroenterology

## 2021-12-24 ENCOUNTER — Other Ambulatory Visit: Payer: Self-pay | Admitting: Endocrinology

## 2021-12-31 ENCOUNTER — Other Ambulatory Visit: Payer: Self-pay | Admitting: Endocrinology

## 2022-01-03 ENCOUNTER — Encounter: Payer: BC Managed Care – PPO | Admitting: Gastroenterology

## 2022-01-07 ENCOUNTER — Other Ambulatory Visit: Payer: Self-pay | Admitting: Endocrinology

## 2022-01-07 DIAGNOSIS — E1165 Type 2 diabetes mellitus with hyperglycemia: Secondary | ICD-10-CM

## 2022-01-20 ENCOUNTER — Other Ambulatory Visit: Payer: Self-pay | Admitting: Endocrinology

## 2022-01-20 DIAGNOSIS — E890 Postprocedural hypothyroidism: Secondary | ICD-10-CM

## 2022-02-14 ENCOUNTER — Other Ambulatory Visit: Payer: Self-pay | Admitting: Endocrinology

## 2022-02-14 DIAGNOSIS — E890 Postprocedural hypothyroidism: Secondary | ICD-10-CM

## 2022-03-06 ENCOUNTER — Other Ambulatory Visit: Payer: BC Managed Care – PPO

## 2022-03-09 ENCOUNTER — Ambulatory Visit: Payer: BC Managed Care – PPO | Admitting: Endocrinology

## 2022-03-27 ENCOUNTER — Other Ambulatory Visit: Payer: Self-pay | Admitting: Endocrinology

## 2022-04-03 ENCOUNTER — Encounter: Payer: Self-pay | Admitting: Endocrinology

## 2022-04-03 DIAGNOSIS — E1169 Type 2 diabetes mellitus with other specified complication: Secondary | ICD-10-CM

## 2022-04-05 NOTE — Telephone Encounter (Signed)
Patient called to advise that co-pay has increased and a PA has been sent. States has a negative reaction to Rybelsus, [place], and Ozempic

## 2022-04-06 ENCOUNTER — Other Ambulatory Visit (HOSPITAL_COMMUNITY): Payer: Self-pay

## 2022-04-10 ENCOUNTER — Telehealth: Payer: Self-pay | Admitting: Pharmacy Technician

## 2022-04-10 NOTE — Telephone Encounter (Signed)
Patient Advocate Encounter   Received notification from Office staff that prior authorization for [facility] 5mg  is required/requested.   PA submitted on 04/10/22 to [employer] via CoverMyMeds Key  [id number] Status is pending  Pharmacy Patient Advocate Fax:  [phone]

## 2022-04-10 NOTE — Telephone Encounter (Signed)
Have you received this form from insurance.

## 2022-04-11 ENCOUNTER — Other Ambulatory Visit (HOSPITAL_COMMUNITY): Payer: Self-pay

## 2022-04-11 NOTE — Telephone Encounter (Signed)
Patient Advocate Encounter  Prior Authorization for [facility] 5mg   has been approved.    PA# PA Case ID: Effective dates: 04/10/22 through 04/11/23  Filled today at 04/13/23.

## 2022-04-12 NOTE — Telephone Encounter (Signed)
This issue is being taken care on a different encounter.

## 2022-04-14 ENCOUNTER — Encounter: Payer: Self-pay | Admitting: Endocrinology

## 2022-04-14 MED ORDER — TRULICITY 4.5 MG/0.5ML ~~LOC~~ SOAJ: 4.5000 mg | SUBCUTANEOUS | 1 refills | Status: DC

## 2022-04-14 NOTE — Telephone Encounter (Signed)
Per patient medication still cost too much. She is requesting a sample but if she can't afford Rx any suggestions on other medications? Please advise

## 2022-04-14 NOTE — Addendum Note (Signed)
Addended by: Eliseo Squires on: 04/14/2022 03:53 PM   Modules accepted: Orders

## 2022-04-14 NOTE — Telephone Encounter (Signed)
Rx sent to pharmacy and patient notified.   

## 2022-04-18 ENCOUNTER — Encounter: Payer: Self-pay | Admitting: Endocrinology

## 2022-04-18 DIAGNOSIS — E1169 Type 2 diabetes mellitus with other specified complication: Secondary | ICD-10-CM

## 2022-04-19 MED ORDER — DEXCOM G7 SENSOR MISC: 2 refills | Status: DC

## 2022-04-21 ENCOUNTER — Telehealth: Payer: Self-pay

## 2022-04-21 ENCOUNTER — Other Ambulatory Visit (HOSPITAL_COMMUNITY): Payer: Self-pay

## 2022-04-21 NOTE — Telephone Encounter (Signed)
Patient Advocate Encounter   Received notification from Karin Golden that prior authorization is required for Dexcom G7  Submitted: 04/21/22 Key Glean Hess  Burnell Blanks, CPhT Rx Patient Advocate Specialist Phone: [phone]

## 2022-04-22 ENCOUNTER — Other Ambulatory Visit: Payer: Self-pay | Admitting: Endocrinology

## 2022-04-22 DIAGNOSIS — E890 Postprocedural hypothyroidism: Secondary | ICD-10-CM

## 2022-04-24 NOTE — Telephone Encounter (Signed)
Patient Advocate Encounter  Received a fax from [employer] regarding Prior Authorization for Dexcom G7.   Authorization has been DENIED due to criteria not met. This plan only covers CGM products when the patient is using an intensive insulin regimen (3+ injections daily). Determination letter added to patient chart.  Clista Bernhardt, CPhT Rx Patient Advocate Specialist Phone: [phone]

## 2022-06-05 ENCOUNTER — Other Ambulatory Visit (INDEPENDENT_AMBULATORY_CARE_PROVIDER_SITE_OTHER): Payer: BC Managed Care – PPO

## 2022-06-05 DIAGNOSIS — E1169 Type 2 diabetes mellitus with other specified complication: Secondary | ICD-10-CM | POA: Diagnosis not present

## 2022-06-05 DIAGNOSIS — E890 Postprocedural hypothyroidism: Secondary | ICD-10-CM | POA: Diagnosis not present

## 2022-06-05 DIAGNOSIS — E669 Obesity, unspecified: Secondary | ICD-10-CM | POA: Diagnosis not present

## 2022-06-05 LAB — BASIC METABOLIC PANEL
BUN: 9 mg/dL (ref 6–23)
CO2: 26 mEq/L (ref 19–32)
Calcium: 9 mg/dL (ref 8.4–10.5)
Chloride: 98 mEq/L (ref 96–112)
Creatinine, Ser: 0.79 mg/dL (ref 0.40–1.20)
GFR: 83.35 mL/min (ref >60.00)
Glucose, Bld: 140 mg/dL — ABNORMAL HIGH (ref 70–99)
Potassium: 4 mEq/L (ref 3.5–5.1)
Sodium: 134 mEq/L — ABNORMAL LOW (ref 135–145)

## 2022-06-05 LAB — MICROALBUMIN / CREATININE URINE RATIO
Creatinine,U: 21.5 mg/dL
Microalb Creat Ratio: 3.3 mg/g (ref 0.0–30.0)
Microalb, Ur: <0.7 mg/dL (ref 0.0–1.9)

## 2022-06-05 LAB — T3, FREE: T3, Free: 3.4 pg/mL (ref 2.3–4.2)

## 2022-06-05 LAB — T4, FREE: Free T4: 1.36 ng/dL (ref 0.60–1.60)

## 2022-06-05 LAB — TSH: TSH: 0.1 u[IU]/mL — ABNORMAL LOW (ref 0.35–5.50)

## 2022-06-06 LAB — HEMOGLOBIN A1C: Hgb A1c MFr Bld: 6.9 % — ABNORMAL HIGH (ref 4.6–6.5)

## 2022-06-08 ENCOUNTER — Encounter: Payer: Self-pay | Admitting: Endocrinology

## 2022-06-08 ENCOUNTER — Ambulatory Visit: Payer: BC Managed Care – PPO | Admitting: Endocrinology

## 2022-06-08 VITALS — BP 140/82 | HR 77 | Ht 65.0 in | Wt 180.2 lb

## 2022-06-08 DIAGNOSIS — E782 Mixed hyperlipidemia: Secondary | ICD-10-CM | POA: Diagnosis not present

## 2022-06-08 DIAGNOSIS — E669 Obesity, unspecified: Secondary | ICD-10-CM

## 2022-06-08 DIAGNOSIS — E890 Postprocedural hypothyroidism: Secondary | ICD-10-CM

## 2022-06-08 DIAGNOSIS — E1169 Type 2 diabetes mellitus with other specified complication: Secondary | ICD-10-CM | POA: Diagnosis not present

## 2022-06-08 DIAGNOSIS — R1313 Dysphagia, pharyngeal phase: Secondary | ICD-10-CM

## 2022-06-08 MED ORDER — LEVOTHYROXINE SODIUM 75 MCG PO TABS: 75.0000 ug | ORAL_TABLET | Freq: Every day | ORAL | 3 refills | Status: AC

## 2022-06-08 NOTE — Progress Notes (Signed)
Patient ID: Nicole Crane, female   DOB: 1966/02/13, 56 y.o.   MRN: [id number]           Reason for Appointment: Endocrinology follow-up  History of Present Illness    DIABETES type II   Diagnosis date:  About 2005  Previous history: She had gestational diabetes in the past Subsequently she developed diabetes which initially was mild and treated with metformin alone In the past her highest weight has been about 300 pounds She thinks her blood sugars are relatively well controlled for a few years with this and subsequently Amaryl added About 4 years ago for better control was given Bydureon which apparently kept her sugars controlled and she had lost some weight Because of high out-of-pocket expense this was stopped about 2 years ago  Recent history:     Non-insulin hypoglycemic drugs: Trulicity 4.5 mg weekly, metformin ER 2 g daily           INSULIN regimen: none  Current diabetes management, blood sugar patterns and problems identified:  Her A1c is 6.9 compared to 6.2 She had been on Mounjaro since 10/22 but recently because of high co-pay she is now starting Trulicity since early August However her blood sugar control seems to be somewhat worse with A1c going up Recently however her GMI is 6.3 on her [employer] Currently her weight is down about 14 pounds since her last visit in April  She has only mild postprandial hyperglycemia either at breakfast or late evening but not consistently Overnight blood sugars are also well controlled without any basal insulin No side effects with metformin She is still able to keep portions of carbohydrates, In the morning she is generally eating [place] yogurt for breakfast  Currently she has been able to walk regularly either on the treadmill or outside   Side effects from medications:  Visual disturbance from Victoza, possibly UTI from [facility] version 3 was analyzed over the last 2 weeks with the download  at the following patterns are seen  Overnight blood sugars are excellent with average readings in the low 100 range as low as 103 but not below normal Postprandial readings are averaging 152 after breakfast but only about 130-140 after dinner/evening snack FASTING blood sugars show mild dawn phenomenon with average about 115 before breakfast Has only occasional spikes over 180 after meals Blood sugars are also excellent before dinner Variability is mild CGM use % of time   2-week average/GV 126/19  Time in range       98%  % Time Above 180 2  % Time above 250   % Time Below 70    Previous data:   CGM use % of time 76  2-week average/GV 129  Time in range     96   %, was 64  % Time Above 180 4  % Time above 250   % Time Below 70 0     PRE-MEAL Fasting Lunch Dinner Bedtime Overall  Glucose range:       Averages: 124       POST-MEAL PC Breakfast PC Lunch PC Dinner  Glucose range:     Averages: 140 137 137               Dietician visit: Most recent:     At diagnosis  Weight control:  Wt Readings from Last 3 Encounters:  06/08/22 180 lb 3.2 oz (81.7 kg)  12/20/21 194 lb (88  kg)  11/14/21 194 lb 6.4 oz (88.2 kg)         Diabetes labs:  Lab Results  Component Value Date   HGBA1C 6.9 (H) 06/05/2022   HGBA1C 6.2 11/01/2021   HGBA1C 7.6 (H) 06/24/2021   Lab Results  Component Value Date   MICROALBUR <0.7 06/05/2022   LDLCALC 70 06/24/2021   CREATININE 0.79 06/05/2022    Other problems including hypothyroidism: See review of systems   Allergies as of 06/08/2022       Reactions   Alcohol Hives, Shortness Of Breath, Swelling   Antihistamines, Diphenhydramine-type Hives   Avelox [moxifloxacin Hcl In Nacl] Other (See Comments)   hallucinations.   Dilaudid [hydromorphone Hcl] Hives, Itching   Erythromycin Base Hives   Kiwi Extract Itching   Lips itch   Levofloxacin Other (See Comments)   Hallucinations.   Morphine And Related Hives, Itching   Naproxen  Other (See Comments)   Severe acid reflux--possible ulcers   Other    Anything with alcohol that she has to injest.   Penicillins Swelling, Other (See Comments)   Welts Has patient had a PCN reaction causing immediate rash, facial/tongue/throat swelling, SOB or lightheadedness with hypotension: Yes--lips swelling Has patient had a PCN reaction causing severe rash involving mucus membranes or skin necrosis: No Has patient had a PCN reaction that required hospitalization: No Has patient had a PCN reaction occurring within the last 10 years: No If all of the above answers are "NO", then may proceed with Cephalosporin use.   Prednisone Other (See Comments)   Heart races with large doses/high dose.   Pregabalin Swelling, Other (See Comments)   Fluid retention caused weight gained 50 LBS of fluid in ~ 4 days   Statins Other (See Comments)   Muscle/joint aches & pains   Sulfa Antibiotics Nausea And Vomiting   Victoza [liraglutide] Other (See Comments)   Caused visual disturbances (visual impairments)   Xigduo Xr [dapagliflozin-metformin Hcl Er]    Causes UTI's        Medication List        Accurate as of June 08, 2022 11:59 PM. If you have any questions, ask your nurse or doctor.          STOP taking these medications    Mounjaro 5 MG/0.5ML Pen Generic drug: tirzepatide Stopped by: Elayne Snare, MD       TAKE these medications    albuterol 108 (90 Base) MCG/ACT inhaler Commonly known as: VENTOLIN HFA Inhale 1-2 puffs into the lungs every 6 (six) hours as needed for wheezing or shortness of breath.   ALPRAZolam 0.5 MG tablet Commonly known as: XANAX Take 0.5 mg by mouth at bedtime.   atorvastatin 40 MG tablet Commonly known as: LIPITOR Take 40 mg by mouth every evening.   Dexcom G7 Sensor Misc Change every 10 days   ibuprofen 800 MG tablet Commonly known as: ADVIL Take 800 mg by mouth at bedtime.   Insulin Pen Needle 31G X 5 MM Misc Use to inject insulin  4 times daily.   levothyroxine 75 MCG tablet Commonly known as: SYNTHROID Take 1 tablet (75 mcg total) by mouth daily. What changed:  medication strength See the new instructions. Changed by: Elayne Snare, MD   liothyronine 5 MCG tablet Commonly known as: CYTOMEL TAKE ONE TABLET BY MOUTH DAILY   lisinopril 20 MG tablet Commonly known as: ZESTRIL Take 20 mg by mouth daily.   metFORMIN 500 MG 24 hr tablet Commonly known as:  GLUCOPHAGE-XR TAKE FOUR TABLETS BY MOUTH DAILY   OneTouch Delica Lancets 44R Misc 1 each by Does not apply route 3 (three) times daily. Use Onetouch Delica Lancets to check blood sugar three times daily.   OneTouch Verio Flex System w/Device Kit Use onetouch verio flex to check blood sugar three times daily.   OneTouch Verio test strip Generic drug: glucose blood USE TO CHECK BLOOD SUGAR 3 TIMES DAILY   pantoprazole 40 MG tablet Commonly known as: PROTONIX Take 40 mg by mouth every evening.   Trulicity 4.5 XV/4.0GQ Sopn Generic drug: Dulaglutide Inject 4.5 mg as directed once a week.        Allergies:  Allergies  Allergen Reactions   Alcohol Hives, Shortness Of Breath and Swelling   Antihistamines, Diphenhydramine-Type Hives   Avelox [Moxifloxacin Hcl In Nacl] Other (See Comments)    hallucinations.   Dilaudid [Hydromorphone Hcl] Hives and Itching   Erythromycin Base Hives   Kiwi Extract Itching    Lips itch   Levofloxacin Other (See Comments)    Hallucinations.   Morphine And Related Hives and Itching   Naproxen Other (See Comments)    Severe acid reflux--possible ulcers   Other     Anything with alcohol that she has to injest.   Penicillins Swelling and Other (See Comments)    Welts Has patient had a PCN reaction causing immediate rash, facial/tongue/throat swelling, SOB or lightheadedness with hypotension: Yes--lips swelling Has patient had a PCN reaction causing severe rash involving mucus membranes or skin necrosis: No Has patient  had a PCN reaction that required hospitalization: No Has patient had a PCN reaction occurring within the last 10 years: No If all of the above answers are "NO", then may proceed with Cephalosporin use.    Prednisone Other (See Comments)    Heart races with large doses/high dose.   Pregabalin Swelling and Other (See Comments)    Fluid retention caused weight gained 50 LBS of fluid in ~ 4 days   Statins Other (See Comments)    Muscle/joint aches & pains   Sulfa Antibiotics Nausea And Vomiting   Victoza [Liraglutide] Other (See Comments)    Caused visual disturbances (visual impairments)   Xigduo Xr [Dapagliflozin-Metformin Hcl Er]     Causes UTI's    Past Medical History:  Diagnosis Date   Allergic rhinitis    Allergy    Anxiety    Bilateral leg edema    DDD (degenerative disc disease), lumbosacral    Diabetes mellitus without complication (HCC)    GERD (gastroesophageal reflux disease)    High cholesterol    Hx of blood clots    post op in abdomen after BSO   Hypertension    Hyperthyroidism    Multiple thyroid nodules    OA (osteoarthritis) of shoulder    Spinal stenosis    Tick bite 02/14/2018   [place] tick    Past Surgical History:  Procedure Laterality Date   ABDOMINAL HYSTERECTOMY     complete   BACK SURGERY     BONE MARROW BIOPSY     after BSO to check clotting   CHOLECYSTECTOMY     DILATION AND CURETTAGE OF UTERUS     ENDOMETRIAL ABLATION     EXPLORATORY LAPAROTOMY     BSO   MOUTH SURGERY     ROTATOR CUFF REPAIR     THYROIDECTOMY N/A 02/26/2018   Procedure: TOTAL THYROIDECTOMY;  Surgeon: Armandina Gemma, MD;  Location: [facility];  Service: General;  Laterality: N/A;    Family History  Problem Relation Age of Onset   Breast cancer Mother    Diabetes Brother    Breast cancer Maternal Grandmother    Thyroid disease Neg Hx    Colon cancer Neg Hx    Colon polyps Neg Hx    Esophageal cancer Neg Hx    Rectal cancer Neg Hx    Stomach cancer Neg Hx      Social History:  reports that she has never smoked. She has never used smokeless tobacco. She reports that she does not drink alcohol and does not use drugs.  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  She would previously complain of fatigue with trying to lower her levothyroxine doses when her TSH was low  She is continuing on a combination of 88 mcg of levothyroxine along with 5 mcg of liothyronine since 11/2019 She is not complaining of fatigue  She takes her supplements consistently before breakfast  TSH is now low compared to last October and free T4 relatively higher at than before Also free T3 is in normal range  Lab Results  Component Value Date   TSH 0.10 (L) 06/05/2022   TSH 1.53 06/24/2021   TSH 2.38 01/20/2021   FREET4 1.36 06/05/2022   FREET4 1.15 06/24/2021   FREET4 1.03 01/20/2021   Lab Results  Component Value Date   T3FREE 3.4 06/05/2022   T3FREE 2.8 01/20/2021   T3FREE 2.9 01/19/2020   T3FREE 3.2 09/27/2018   T3FREE 3.3 05/22/2018     Hypertension:  On lisinopril, followed by PCP also  Home blood pressure  usually not high  BP Readings from Last 3 Encounters:  06/08/22 (!) 140/82  11/14/21 122/72  11/05/21 (!) 164/90     Lipids: Has been on atorvastatin 40 mg for some time, prescribed by PCP, LDL is last 70 with normal triglycerides  Lab Results  Component Value Date   CHOL 146 06/24/2021   HDL 47.30 06/24/2021   LDLCALC 70 06/24/2021   LDLDIRECT 75.0 01/18/2021   TRIG 145.0 06/24/2021   CHOLHDL 3 06/24/2021     Physical Exam  BP (!) 140/82   Pulse 77   Ht 5' 5"  (1.651 m)   Wt 180 lb 3.2 oz (81.7 kg)   SpO2 96%   BMI 29.99 kg/m    ASSESSMENT/ PLAN:    Diabetes type 2 non-insulin-dependent  Her A1c is 6.9  See history of present illness for detailed discussion of current diabetes management, blood sugar patterns and problems identified  She is on  metformin  and Trulicity 4.5 mg weekly, tolerating both well  Blood sugars are recently well controlled even with using Trulicity However she may have a higher A1c from having prednisone prior to this month Currently not requiring insulin especially with overnight blood sugars Normal  She recently has overall average blood sugar on her sensor of 126 and GMI 6.3 which indicates adequate control   Recommendations: Continue same dose of Trulicity She will continue regular exercise and watching portions and snacks Discussed blood sugar targets fasting and after meals May continue to use the freestyle libre version 3 as Dexcom is not affordable  She will continue to really work on her diet to avoid late night high blood sugars  HYPOTHYROIDISM, postsurgical  Possibly because of weight loss however TSH is now low compared to last year and free T4 relatively higher Currently asymptomatic We will reduce her levothyroxine to  75 instead of 88 we will continue 5 mcg of liothyronine  Will need labs on the next visit  Hypertension: Blood pressure is high normal Continue to follow-up with PCP  Pharyngeal dysphagia: Although it is unlikely that she has a recurrence of her goiter will check ultrasound and if negative follow-up with PCP  Lipids: Needs follow-up with PCP, no labs available this year  There are no Patient Instructions on file for this visit.      Elayne Snare 06/11/2022, 4:12 PM

## 2022-06-11 ENCOUNTER — Encounter: Payer: Self-pay | Admitting: Endocrinology

## 2022-06-14 ENCOUNTER — Inpatient Hospital Stay: Admission: RE | Admit: 2022-06-14 | Payer: BC Managed Care – PPO | Source: Ambulatory Visit

## 2022-06-16 ENCOUNTER — Encounter: Payer: Self-pay | Admitting: Endocrinology

## 2022-06-21 ENCOUNTER — Ambulatory Visit
Admission: RE | Admit: 2022-06-21 | Discharge: 2022-06-21 | Disposition: A | Discharge status: Home / Self Care | Payer: BC Managed Care – PPO | Source: Ambulatory Visit | Attending: Endocrinology | Admitting: Endocrinology

## 2022-06-21 DIAGNOSIS — R1313 Dysphagia, pharyngeal phase: Secondary | ICD-10-CM

## 2022-06-21 DIAGNOSIS — E890 Postprocedural hypothyroidism: Secondary | ICD-10-CM

## 2022-06-27 NOTE — Progress Notes (Signed)
Please call to let patient know that the ultrasound results are normal and no further action needed.  Also needs to schedule a follow-up appointment in about 4 months for diabetes

## 2022-07-17 ENCOUNTER — Other Ambulatory Visit (HOSPITAL_COMMUNITY): Payer: Self-pay

## 2022-07-17 ENCOUNTER — Telehealth: Payer: Self-pay | Admitting: Pharmacy Technician

## 2022-07-17 NOTE — Telephone Encounter (Signed)
Pharmacy Patient Advocate Encounter   Received notification from CoverMyMeds that prior authorization for Dexcom G7 is required/requested.  I see the notes say she may continue the Freestyle due to the Dexcom being expensive. Would you like [place] to proceed with the PA request in CoverMyMeds?

## 2022-07-19 ENCOUNTER — Other Ambulatory Visit (HOSPITAL_COMMUNITY): Payer: Self-pay

## 2022-07-19 ENCOUNTER — Telehealth: Payer: Self-pay

## 2022-07-19 NOTE — Telephone Encounter (Signed)
Patient Advocate Encounter   Received notification from [employer] that prior authorization is required for [facility] G7 Sensor  Submitted: 07-19-2022 Key [id number]  Status is pending

## 2022-07-19 NOTE — Telephone Encounter (Signed)
Pharmacy Patient Advocate Encounter  Received notification from [employer] that the request for prior authorization for Dexcom G7 Sensor has been denied due to no intensive insulin regimen.  Key: [id number]  Denial letter attached to charts

## 2022-08-08 NOTE — Telephone Encounter (Signed)
She has appointment scheduled for Feb

## 2022-09-17 ENCOUNTER — Other Ambulatory Visit: Payer: Self-pay | Admitting: Endocrinology

## 2022-09-28 ENCOUNTER — Other Ambulatory Visit: Payer: Self-pay | Admitting: Endocrinology

## 2022-09-28 DIAGNOSIS — E1169 Type 2 diabetes mellitus with other specified complication: Secondary | ICD-10-CM

## 2022-10-31 ENCOUNTER — Other Ambulatory Visit: Payer: BC Managed Care – PPO

## 2022-11-03 ENCOUNTER — Ambulatory Visit: Payer: BC Managed Care – PPO | Admitting: Endocrinology

## 2022-12-26 ENCOUNTER — Other Ambulatory Visit: Payer: Self-pay

## 2022-12-26 DIAGNOSIS — E1169 Type 2 diabetes mellitus with other specified complication: Secondary | ICD-10-CM

## 2023-01-03 ENCOUNTER — Other Ambulatory Visit: Payer: Self-pay

## 2023-01-03 MED ORDER — METFORMIN HCL ER 500 MG PO TB24: 2000.0000 mg | ORAL_TABLET | Freq: Every day | ORAL | 0 refills | Status: DC

## 2023-01-16 ENCOUNTER — Other Ambulatory Visit: Payer: Self-pay | Admitting: Endocrinology

## 2023-01-16 DIAGNOSIS — E1169 Type 2 diabetes mellitus with other specified complication: Secondary | ICD-10-CM

## 2023-01-20 ENCOUNTER — Other Ambulatory Visit: Payer: Self-pay | Admitting: Endocrinology

## 2023-01-20 DIAGNOSIS — E890 Postprocedural hypothyroidism: Secondary | ICD-10-CM

## 2023-01-30 ENCOUNTER — Other Ambulatory Visit: Payer: Self-pay | Admitting: Endocrinology

## 2023-02-10 ENCOUNTER — Other Ambulatory Visit: Payer: Self-pay | Admitting: Endocrinology

## 2023-02-10 DIAGNOSIS — E890 Postprocedural hypothyroidism: Secondary | ICD-10-CM

## 2023-02-20 ENCOUNTER — Other Ambulatory Visit: Payer: Self-pay | Admitting: Endocrinology

## 2023-02-25 ENCOUNTER — Other Ambulatory Visit: Payer: Self-pay | Admitting: Endocrinology

## 2023-02-27 ENCOUNTER — Other Ambulatory Visit: Payer: Self-pay | Admitting: Endocrinology

## 2023-02-28 ENCOUNTER — Emergency Department: Payer: BC Managed Care – PPO

## 2023-02-28 ENCOUNTER — Observation Stay
Admission: EM | Admit: 2023-02-28 | Discharge: 2023-03-02 | Disposition: A | Discharge status: Home / Self Care | Payer: BC Managed Care – PPO | Attending: Internal Medicine | Admitting: Internal Medicine

## 2023-02-28 ENCOUNTER — Inpatient Hospital Stay (HOSPITAL_COMMUNITY)
Admit: 2023-02-28 | Discharge: 2023-02-28 | Disposition: A | Discharge status: Home / Self Care | Payer: BC Managed Care – PPO | Attending: Family Medicine | Admitting: Family Medicine

## 2023-02-28 ENCOUNTER — Other Ambulatory Visit: Payer: Self-pay

## 2023-02-28 DIAGNOSIS — R55 Syncope and collapse: Secondary | ICD-10-CM | POA: Diagnosis not present

## 2023-02-28 DIAGNOSIS — Z79899 Other long term (current) drug therapy: Secondary | ICD-10-CM

## 2023-02-28 DIAGNOSIS — I4719 Other supraventricular tachycardia: Secondary | ICD-10-CM | POA: Diagnosis present

## 2023-02-28 DIAGNOSIS — Z888 Allergy status to other drugs, medicaments and biological substances status: Secondary | ICD-10-CM

## 2023-02-28 DIAGNOSIS — Z833 Family history of diabetes mellitus: Secondary | ICD-10-CM | POA: Diagnosis not present

## 2023-02-28 DIAGNOSIS — I10 Essential (primary) hypertension: Secondary | ICD-10-CM | POA: Diagnosis not present

## 2023-02-28 DIAGNOSIS — Z90710 Acquired absence of both cervix and uterus: Secondary | ICD-10-CM

## 2023-02-28 DIAGNOSIS — Z6830 Body mass index (BMI) 30.0-30.9, adult: Secondary | ICD-10-CM

## 2023-02-28 DIAGNOSIS — J309 Allergic rhinitis, unspecified: Secondary | ICD-10-CM | POA: Diagnosis present

## 2023-02-28 DIAGNOSIS — R519 Headache, unspecified: Secondary | ICD-10-CM | POA: Diagnosis present

## 2023-02-28 DIAGNOSIS — Z9102 Food additives allergy status: Secondary | ICD-10-CM

## 2023-02-28 DIAGNOSIS — Z86718 Personal history of other venous thrombosis and embolism: Secondary | ICD-10-CM | POA: Diagnosis not present

## 2023-02-28 DIAGNOSIS — R531 Weakness: Secondary | ICD-10-CM | POA: Diagnosis present

## 2023-02-28 DIAGNOSIS — E1165 Type 2 diabetes mellitus with hyperglycemia: Secondary | ICD-10-CM | POA: Insufficient documentation

## 2023-02-28 DIAGNOSIS — Z882 Allergy status to sulfonamides status: Secondary | ICD-10-CM

## 2023-02-28 DIAGNOSIS — R197 Diarrhea, unspecified: Secondary | ICD-10-CM

## 2023-02-28 DIAGNOSIS — I2489 Other forms of acute ischemic heart disease: Secondary | ICD-10-CM | POA: Diagnosis present

## 2023-02-28 DIAGNOSIS — F419 Anxiety disorder, unspecified: Secondary | ICD-10-CM | POA: Diagnosis present

## 2023-02-28 DIAGNOSIS — E7800 Pure hypercholesterolemia, unspecified: Secondary | ICD-10-CM | POA: Diagnosis present

## 2023-02-28 DIAGNOSIS — Z8249 Family history of ischemic heart disease and other diseases of the circulatory system: Secondary | ICD-10-CM

## 2023-02-28 DIAGNOSIS — E785 Hyperlipidemia, unspecified: Secondary | ICD-10-CM | POA: Diagnosis not present

## 2023-02-28 DIAGNOSIS — E669 Obesity, unspecified: Secondary | ICD-10-CM | POA: Diagnosis present

## 2023-02-28 DIAGNOSIS — Z885 Allergy status to narcotic agent status: Secondary | ICD-10-CM

## 2023-02-28 DIAGNOSIS — S022XXA Fracture of nasal bones, initial encounter for closed fracture: Secondary | ICD-10-CM

## 2023-02-28 DIAGNOSIS — Z880 Allergy status to penicillin: Secondary | ICD-10-CM

## 2023-02-28 DIAGNOSIS — K219 Gastro-esophageal reflux disease without esophagitis: Secondary | ICD-10-CM | POA: Diagnosis present

## 2023-02-28 DIAGNOSIS — Z79890 Hormone replacement therapy (postmenopausal): Secondary | ICD-10-CM | POA: Diagnosis not present

## 2023-02-28 DIAGNOSIS — M5137 Other intervertebral disc degeneration, lumbosacral region: Secondary | ICD-10-CM | POA: Diagnosis present

## 2023-02-28 DIAGNOSIS — Z886 Allergy status to analgesic agent status: Secondary | ICD-10-CM

## 2023-02-28 DIAGNOSIS — Z7984 Long term (current) use of oral hypoglycemic drugs: Secondary | ICD-10-CM

## 2023-02-28 DIAGNOSIS — W19XXXA Unspecified fall, initial encounter: Secondary | ICD-10-CM | POA: Diagnosis present

## 2023-02-28 DIAGNOSIS — Z881 Allergy status to other antibiotic agents status: Secondary | ICD-10-CM

## 2023-02-28 DIAGNOSIS — Z7985 Long-term (current) use of injectable non-insulin antidiabetic drugs: Secondary | ICD-10-CM

## 2023-02-28 DIAGNOSIS — E1169 Type 2 diabetes mellitus with other specified complication: Secondary | ICD-10-CM

## 2023-02-28 DIAGNOSIS — Z794 Long term (current) use of insulin: Secondary | ICD-10-CM

## 2023-02-28 DIAGNOSIS — R7989 Other specified abnormal findings of blood chemistry: Secondary | ICD-10-CM | POA: Diagnosis not present

## 2023-02-28 DIAGNOSIS — E890 Postprocedural hypothyroidism: Secondary | ICD-10-CM | POA: Diagnosis not present

## 2023-02-28 DIAGNOSIS — Z9049 Acquired absence of other specified parts of digestive tract: Secondary | ICD-10-CM

## 2023-02-28 LAB — CBC WITH DIFFERENTIAL/PLATELET
Abs Immature Granulocytes: 0.09 10*3/uL — ABNORMAL HIGH (ref 0.00–0.07)
Basophils Absolute: 0.1 10*3/uL (ref 0.0–0.1)
Basophils Relative: 0 %
Eosinophils Absolute: 0.1 10*3/uL (ref 0.0–0.5)
Eosinophils Relative: 1 %
HCT: 37.6 % (ref 36.0–46.0)
Hemoglobin: 12.2 g/dL (ref 12.0–15.0)
Immature Granulocytes: 1 %
Lymphocytes Relative: 14 %
Lymphs Abs: 2.4 10*3/uL (ref 0.7–4.0)
MCH: 29.5 pg (ref 26.0–34.0)
MCHC: 32.4 g/dL (ref 30.0–36.0)
MCV: 90.8 fL (ref 80.0–100.0)
Monocytes Absolute: 0.9 10*3/uL (ref 0.1–1.0)
Monocytes Relative: 5 %
Neutro Abs: 13.5 10*3/uL — ABNORMAL HIGH (ref 1.7–7.7)
Neutrophils Relative %: 79 %
Platelets: 285 10*3/uL (ref 150–400)
RBC: 4.14 MIL/uL (ref 3.87–5.11)
RDW: 12.3 % (ref 11.5–15.5)
WBC: 17 10*3/uL — ABNORMAL HIGH (ref 4.0–10.5)
nRBC: 0 % (ref 0.0–0.2)

## 2023-02-28 LAB — LIPID PANEL
Cholesterol: 142 mg/dL (ref 0–200)
HDL: 57 mg/dL (ref >40)
LDL Cholesterol: 70 mg/dL (ref 0–99)
Total CHOL/HDL Ratio: 2.5 RATIO
Triglycerides: 73 mg/dL (ref <150)
VLDL: 15 mg/dL (ref 0–40)

## 2023-02-28 LAB — COMPREHENSIVE METABOLIC PANEL
ALT: 20 U/L (ref 0–44)
AST: 19 U/L (ref 15–41)
Albumin: 3.7 g/dL (ref 3.5–5.0)
Alkaline Phosphatase: 85 U/L (ref 38–126)
Anion gap: 7 (ref 5–15)
BUN: 14 mg/dL (ref 6–20)
CO2: 26 mmol/L (ref 22–32)
Calcium: 8.3 mg/dL — ABNORMAL LOW (ref 8.9–10.3)
Chloride: 103 mmol/L (ref 98–111)
Creatinine, Ser: 0.94 mg/dL (ref 0.44–1.00)
GFR, Estimated: >60 mL/min (ref >60)
Glucose, Bld: 231 mg/dL — ABNORMAL HIGH (ref 70–99)
Potassium: 3.7 mmol/L (ref 3.5–5.1)
Sodium: 136 mmol/L (ref 135–145)
Total Bilirubin: 0.6 mg/dL (ref 0.3–1.2)
Total Protein: 5.9 g/dL — ABNORMAL LOW (ref 6.5–8.1)

## 2023-02-28 LAB — HEMOGLOBIN A1C
Hgb A1c MFr Bld: 5.7 % — ABNORMAL HIGH (ref 4.8–5.6)
Mean Plasma Glucose: 116.89 mg/dL

## 2023-02-28 LAB — URINALYSIS, ROUTINE W REFLEX MICROSCOPIC
Bilirubin Urine: NEGATIVE
Glucose, UA: 150 mg/dL — AB
Hgb urine dipstick: NEGATIVE
Ketones, ur: NEGATIVE mg/dL
Leukocytes,Ua: NEGATIVE
Nitrite: NEGATIVE
Protein, ur: NEGATIVE mg/dL
Specific Gravity, Urine: 1.015 (ref 1.005–1.030)
pH: 6 (ref 5.0–8.0)

## 2023-02-28 LAB — TROPONIN I (HIGH SENSITIVITY)
Troponin I (High Sensitivity): 12 ng/L (ref <18)
Troponin I (High Sensitivity): 30 ng/L — ABNORMAL HIGH (ref <18)

## 2023-02-28 LAB — GLUCOSE, CAPILLARY
Glucose-Capillary: 114 mg/dL — ABNORMAL HIGH (ref 70–99)
Glucose-Capillary: 129 mg/dL — ABNORMAL HIGH (ref 70–99)

## 2023-02-28 LAB — ECHOCARDIOGRAM COMPLETE
AR max vel: 2.26 cm2
AV Area VTI: 2.75 cm2
AV Area mean vel: 2.56 cm2
AV Mean grad: 4 mmHg
AV Peak grad: 6.9 mmHg
Ao pk vel: 1.31 m/s
Area-P 1/2: 7.82 cm2
Height: 64.5 in
S' Lateral: 2.3 cm
Weight: 2800 oz

## 2023-02-28 LAB — TSH: TSH: 11.08 u[IU]/mL — ABNORMAL HIGH (ref 0.350–4.500)

## 2023-02-28 LAB — LIPASE, BLOOD: Lipase: 30 U/L (ref 11–51)

## 2023-02-28 LAB — HIV ANTIBODY (ROUTINE TESTING W REFLEX): HIV Screen 4th Generation wRfx: NONREACTIVE

## 2023-02-28 LAB — CBG MONITORING, ED: Glucose-Capillary: 147 mg/dL — ABNORMAL HIGH (ref 70–99)

## 2023-02-28 MED ORDER — SODIUM CHLORIDE 0.9% FLUSH
3.0000 mL | Freq: Two times a day (BID) | INTRAVENOUS | Status: DC
  Administered 2023-02-28 – 2023-03-02 (×4): 3 mL via INTRAVENOUS

## 2023-02-28 MED ORDER — SODIUM CHLORIDE 0.9 % IV SOLN: INTRAVENOUS | Status: DC

## 2023-02-28 MED ORDER — IOHEXOL 350 MG/ML SOLN
100.0000 mL | Freq: Once | INTRAVENOUS | Status: AC | PRN
  Administered 2023-02-28: 100 mL via INTRAVENOUS

## 2023-02-28 MED ORDER — PANTOPRAZOLE SODIUM 40 MG PO TBEC
40.0000 mg | DELAYED_RELEASE_TABLET | Freq: Two times a day (BID) | ORAL | Status: DC
  Administered 2023-02-28 – 2023-03-02 (×5): 40 mg via ORAL
  Filled 2023-02-28 (×5): qty 1

## 2023-02-28 MED ORDER — LISINOPRIL 10 MG PO TABS: 20.0000 mg | ORAL_TABLET | Freq: Every day | ORAL | Status: DC

## 2023-02-28 MED ORDER — INSULIN ASPART 100 UNIT/ML IJ SOLN: 3.0000 [IU] | Freq: Three times a day (TID) | INTRAMUSCULAR | Status: DC

## 2023-02-28 MED ORDER — LIOTHYRONINE SODIUM 5 MCG PO TABS
5.0000 ug | ORAL_TABLET | Freq: Every day | ORAL | Status: DC
  Administered 2023-03-01 – 2023-03-02 (×2): 5 ug via ORAL
  Filled 2023-02-28 (×3): qty 1

## 2023-02-28 MED ORDER — ASPIRIN 81 MG PO CHEW
324.0000 mg | CHEWABLE_TABLET | Freq: Once | ORAL | Status: AC
  Administered 2023-02-28: 324 mg via ORAL
  Filled 2023-02-28: qty 4

## 2023-02-28 MED ORDER — LEVOTHYROXINE SODIUM 50 MCG PO TABS
75.0000 ug | ORAL_TABLET | Freq: Every day | ORAL | Status: DC
  Administered 2023-03-01 – 2023-03-02 (×2): 75 ug via ORAL
  Filled 2023-02-28 (×2): qty 1

## 2023-02-28 MED ORDER — INSULIN ASPART 100 UNIT/ML IJ SOLN: 0.0000 [IU] | Freq: Three times a day (TID) | INTRAMUSCULAR | Status: DC

## 2023-02-28 MED ORDER — ENOXAPARIN SODIUM 40 MG/0.4ML IJ SOSY
40.0000 mg | PREFILLED_SYRINGE | INTRAMUSCULAR | Status: DC
  Filled 2023-02-28: qty 0.4

## 2023-02-28 MED ORDER — LACTATED RINGERS IV BOLUS
1000.0000 mL | Freq: Once | INTRAVENOUS | Status: AC
  Administered 2023-02-28: 1000 mL via INTRAVENOUS

## 2023-02-28 MED ORDER — TRAMADOL HCL 50 MG PO TABS: 50.0000 mg | ORAL_TABLET | Freq: Four times a day (QID) | ORAL | Status: DC | PRN

## 2023-02-28 MED ORDER — ONDANSETRON HCL 4 MG PO TABS: 4.0000 mg | ORAL_TABLET | Freq: Four times a day (QID) | ORAL | Status: DC | PRN

## 2023-02-28 MED ORDER — ALPRAZOLAM 0.5 MG PO TABS
0.5000 mg | ORAL_TABLET | Freq: Four times a day (QID) | ORAL | Status: DC | PRN
  Administered 2023-03-01 (×2): 0.5 mg via ORAL
  Filled 2023-02-28 (×2): qty 1

## 2023-02-28 MED ORDER — ONDANSETRON HCL 4 MG/2ML IJ SOLN: 4.0000 mg | Freq: Four times a day (QID) | INTRAMUSCULAR | Status: DC | PRN

## 2023-02-28 MED ORDER — INSULIN ASPART 100 UNIT/ML IJ SOLN: 0.0000 [IU] | Freq: Every day | INTRAMUSCULAR | Status: DC

## 2023-02-28 MED ORDER — ONDANSETRON HCL 4 MG/2ML IJ SOLN
4.0000 mg | Freq: Once | INTRAMUSCULAR | Status: AC
  Administered 2023-02-28: 4 mg via INTRAVENOUS
  Filled 2023-02-28: qty 2

## 2023-02-28 MED ORDER — OXYCODONE-ACETAMINOPHEN 7.5-325 MG PO TABS
1.0000 | ORAL_TABLET | ORAL | Status: DC | PRN
  Administered 2023-02-28: 1 via ORAL
  Filled 2023-02-28: qty 1

## 2023-02-28 MED ORDER — ATORVASTATIN CALCIUM 20 MG PO TABS
40.0000 mg | ORAL_TABLET | Freq: Every evening | ORAL | Status: DC
  Administered 2023-02-28 – 2023-03-01 (×2): 40 mg via ORAL
  Filled 2023-02-28 (×2): qty 2

## 2023-02-28 MED ORDER — ACETAMINOPHEN 325 MG PO TABS
650.0000 mg | ORAL_TABLET | Freq: Four times a day (QID) | ORAL | Status: DC | PRN
  Administered 2023-02-28 – 2023-03-02 (×5): 650 mg via ORAL
  Filled 2023-02-28 (×5): qty 2

## 2023-02-28 NOTE — Assessment & Plan Note (Signed)
BP stable Titrate home regimen 

## 2023-02-28 NOTE — ED Provider Notes (Addendum)
Care assumed of patient from outgoing provider.  See their note for initial history, exam and plan.  Patient with 2 episodes of syncope in the middle of the night.  Started to not feel well and initial episode of syncope falling face forward into a chair.  Got up when she felt like she needed to have a bowel movement, second episode of syncope.  Complaining of right-sided rib pain.  Facial pain.  No shortness of breath.  Felt fine prior to going to sleep.  History of hypertension, hyperlipidemia, diabetes, GERD.  Prior stress testing years ago but no recent stress testing.  No recent exertional dyspnea or chest pain.  CT scan with nasal fracture that is nondisplaced.  No septal hematoma.  Otherwise no findings of acute trauma.  Initial troponin was negative.  EKG without findings of acute ischemia or dysrhythmia.  Second troponin elevated at 30.  Discussed ice, no nose blowing and ENT follow-up for nasal bridge fracture  Given aspirin.  Consulted hospitalist for admission for syncope and elevated troponin   Corena Herter, MD 02/28/23 [id number]    Corena Herter, MD 02/28/23 [phone]

## 2023-02-28 NOTE — ED Notes (Signed)
Dr. Newton at bedside. 

## 2023-02-28 NOTE — Assessment & Plan Note (Signed)
Blood sugar in 200s Sliding-scale insulin A1c

## 2023-02-28 NOTE — Assessment & Plan Note (Signed)
Continue Synthroid and Cytomel. 

## 2023-02-28 NOTE — Progress Notes (Signed)
*  PRELIMINARY RESULTS* Echocardiogram 2D Echocardiogram has been performed.  Nicole Crane 02/28/2023, 2:19 PM 

## 2023-02-28 NOTE — Consult Note (Signed)
Cardiology Consultation   Patient ID: Nicole Crane MRN: [id number]; DOB: March 12, 1966  Admit date: 02/28/2023 Date of Consult: 02/28/2023  PCP:  Noberto Retort, MD   [facility] Providers Cardiologist:  None      New consult done by Dr. Mariah Milling Reason for consult: Syncope  Patient Profile:   Nicole Crane is a 57 y.o. female with a hx of hypertension, hyperlipidemia, type 2 diabetes, hypothyroidism, osteoarthritis, gastroesophageal reflux disease, degenerative disc disease, anxiety, history of blood clots, who is being seen 02/28/2023 for the evaluation of syncope at the request of Dr. Alvester Morin.  History of Present Illness:   Ms. Work presented to the [facility] emergency department after syncopal episode at home on 2 separate occasions.  States that she woke at 2 AM to take the puppy out, taking her Synthroid, and the plan was to head back to bed.  She states she was sitting on the side of the bed, developed tunnel vision, did not feel well, developed abdominal discomfort and had to get to the bathroom she was going back through her living room and passed out, fell over the oversized recliner in the living.  Per husband she was out for a short period of time. She then woke up with complaints of continued severe abdominal pain and started towards going to the bathroom with assistance. She was able to ambulate to the bathroom. Once she made it into the bathroom she fell to her knees and then fell face forward on the bathroom floor and was unresponsive. Her husband stated that she was out approximately a minute to a minute in a half. The patient does not recall the fall or the falling forward. Patient stated that she was at her baseline prior to the event.  After waking up on the bathroom floor again felt she had to go to the bathroom and encouraged her husband to try to help her up to get to the bathroom, husband occurred her to stay on the ground, she ended up having  diarrhea  Reports having spaghetti at home last night Has had significant weight loss with Mounjaro over the past year Typically does not have GI side effects on the Mounjaro No other family members with GI distress or diarrhea after eating home-cooked meal Denies recent orthostasis symptoms at home, takes lisinopril 20 daily  Reports having some rib discomfort on the right, nose is sore, teeth hurt  Past Medical History:  Diagnosis Date   Allergic rhinitis    Allergy    Anxiety    Bilateral leg edema    DDD (degenerative disc disease), lumbosacral    Diabetes mellitus without complication (HCC)    GERD (gastroesophageal reflux disease)    High cholesterol    Hx of blood clots    post op in abdomen after BSO   Hypertension    Hyperthyroidism    Multiple thyroid nodules    OA (osteoarthritis) of shoulder    Spinal stenosis    Tick bite 02/14/2018   [place] tick    Past Surgical History:  Procedure Laterality Date   ABDOMINAL HYSTERECTOMY     complete   BACK SURGERY     BONE MARROW BIOPSY     after BSO to check clotting   CHOLECYSTECTOMY     DILATION AND CURETTAGE OF UTERUS     ENDOMETRIAL ABLATION     EXPLORATORY LAPAROTOMY     BSO   MOUTH SURGERY     ROTATOR CUFF REPAIR  THYROIDECTOMY N/A 02/26/2018   Procedure: TOTAL THYROIDECTOMY;  Surgeon: Darnell Level, MD;  Location: [facility] ORS;  Service: General;  Laterality: N/A;     Home Medications:  Prior to Admission medications   Medication Sig Start Date End Date Taking? Authorizing Provider  albuterol (PROVENTIL HFA;VENTOLIN HFA) 108 (90 Base) MCG/ACT inhaler Inhale 1-2 puffs into the lungs every 6 (six) hours as needed for wheezing or shortness of breath.   Yes [provider]  ALPRAZolam Prudy Feeler) 0.5 MG tablet Take 0.5 mg by mouth at bedtime.   Yes [provider]  atorvastatin (LIPITOR) 40 MG tablet Take 40 mg by mouth every evening.   Yes [provider]  diclofenac Sodium (VOLTAREN) 1  % GEL Apply 2 g topically 4 (four) times daily.   Yes [provider]  ibuprofen (ADVIL,MOTRIN) 800 MG tablet Take 800 mg by mouth at bedtime.   Yes [provider]  levothyroxine (SYNTHROID) 75 MCG tablet Take 1 tablet (75 mcg total) by mouth daily. 06/08/22  Yes Reather Littler, MD  lidocaine (LIDODERM) 5 % Place 1 patch onto the skin daily.   Yes [provider]  liothyronine (CYTOMEL) 5 MCG tablet TAKE 1 TABLET BY MOUTH DAILY 02/12/23  Yes Reather Littler, MD  lisinopril (PRINIVIL,ZESTRIL) 20 MG tablet Take 20 mg by mouth daily.   Yes [provider]  metFORMIN (GLUCOPHAGE-XR) 500 MG 24 hr tablet TAKE FOUR TABLETS BY MOUTH DAILY; **MUST CALL MD FOR APPOINTMENT FOR FUTURE REFILLS 01/30/23  Yes Reather Littler, MD  pantoprazole (PROTONIX) 40 MG tablet Take 40 mg by mouth every evening.   Yes [provider]  Blood Glucose Monitoring Suppl (ONETOUCH VERIO FLEX SYSTEM) w/Device KIT Use onetouch verio flex to check blood sugar three times daily. 04/28/19   Reather Littler, MD  Continuous Glucose Sensor (FREESTYLE LIBRE 3 SENSOR) MISC APPLY EVERY 14 DAYS AS DIRECTED BY YOUR DOCTOR 01/17/23   Reather Littler, MD  Insulin Pen Needle 31G X 5 MM MISC Use to inject insulin 4 times daily. 06/24/19   Reather Littler, MD  MOUNJARO 5 MG/0.5ML Pen Inject 5 mg into the skin once a week.    [provider]  OneTouch Delica Lancets 30G MISC 1 each by Does not apply route 3 (three) times daily. Use Onetouch Delica Lancets to check blood sugar three times daily. 04/28/19   Reather Littler, MD  [facility] VERIO test strip USE TO CHECK BLOOD SUGAR 3 TIMES DAILY 09/29/19   Reather Littler, MD  TRULICITY 4.5 MG/0.5ML SOPN INJECT 4.5 MG UNDER THE SKIN ONCE WEEKLY Patient not taking: Reported on 02/28/2023 09/28/22   Reather Littler, MD    Inpatient Medications: Scheduled Meds:  atorvastatin  40 mg Oral QPM   enoxaparin (LOVENOX) injection  40 mg Subcutaneous Q24H   insulin aspart  0-5 Units Subcutaneous QHS    insulin aspart  0-9 Units Subcutaneous TID WC   insulin aspart  3 Units Subcutaneous TID WC   [START ON 03/01/2023] levothyroxine  75 mcg Oral Q0600   liothyronine  5 mcg Oral Daily   pantoprazole  40 mg Oral BID   sodium chloride flush  3 mL Intravenous Q12H   Continuous Infusions:  sodium chloride 75 mL/hr at 02/28/23 1024   PRN Meds: ALPRAZolam, ondansetron **OR** ondansetron (ZOFRAN) IV  Allergies:    Allergies  Allergen Reactions   Alcohol Hives, Shortness Of Breath and Swelling   Antihistamines, Diphenhydramine-Type Hives   Avelox [Moxifloxacin Hcl In Nacl] Other (See Comments)  hallucinations.   Dilaudid [Hydromorphone Hcl] Hives and Itching   Erythromycin Base Hives   Kiwi Extract Itching    Lips itch   Levofloxacin Other (See Comments)    Hallucinations.   Morphine And Codeine Hives and Itching   Naproxen Other (See Comments)    Severe acid reflux--possible ulcers   Other     Anything with alcohol that she has to injest.   Penicillins Swelling and Other (See Comments)    Welts Has patient had a PCN reaction causing immediate rash, facial/tongue/throat swelling, SOB or lightheadedness with hypotension: Yes--lips swelling Has patient had a PCN reaction causing severe rash involving mucus membranes or skin necrosis: No Has patient had a PCN reaction that required hospitalization: No Has patient had a PCN reaction occurring within the last 10 years: No If all of the above answers are "NO", then may proceed with Cephalosporin use.    Prednisone Other (See Comments)    Heart races with large doses/high dose.   Pregabalin Swelling and Other (See Comments)    Fluid retention caused weight gained 50 LBS of fluid in ~ 4 days   Statins Other (See Comments)    Muscle/joint aches & pains   Sulfa Antibiotics Nausea And Vomiting   Victoza [Liraglutide] Other (See Comments)    Caused visual disturbances (visual impairments)   Xigduo Xr [Dapagliflozin Pro-Metformin Er]      Causes UTI's    Social History:   Social History   Socioeconomic History   Marital status: Married    Spouse name: Not on file   Number of children: Not on file   Years of education: Not on file   Highest education level: Not on file  Occupational History   Not on file  Tobacco Use   Smoking status: Never   Smokeless tobacco: Never  Vaping Use   Vaping Use: Never used  Substance and Sexual Activity   Alcohol use: No   Drug use: No   Sexual activity: Yes  Other Topics Concern   Not on file  Social History Narrative   Not on file   Social Determinants of Health   Financial Resource Strain: Not on file  Food Insecurity: Not on file  Transportation Needs: Not on file  Physical Activity: Not on file  Stress: Not on file  Social Connections: Not on file  Intimate Partner Violence: Not on file    Family History:    Family History  Problem Relation Age of Onset   Breast cancer Mother    Diabetes Brother    Breast cancer Maternal Grandmother    Thyroid disease Neg Hx    Colon cancer Neg Hx    Colon polyps Neg Hx    Esophageal cancer Neg Hx    Rectal cancer Neg Hx    Stomach cancer Neg Hx      ROS:  Please see the history of present illness.  Review of Systems  Constitutional: Negative.   HENT: Negative.    Respiratory: Negative.    Cardiovascular: Negative.   Gastrointestinal: Negative.   Musculoskeletal: Negative.   Neurological:  Positive for loss of consciousness.  Psychiatric/Behavioral: Negative.    All other systems reviewed and are negative.   Physical Exam/Data:   Vitals:   02/28/23 0630 02/28/23 0800 02/28/23 0830 02/28/23 1003  BP: (!) 133/57 125/68 111/60   Pulse: 91 98 96   Resp: 17 18 13    Temp: 98.4 F (36.9 C)     TempSrc: Oral  SpO2: 100% 100% 100%   Weight:    79.4 kg  Height:    5' 4.5" (1.638 m)    Intake/Output Summary (Last 24 hours) at 02/28/2023 1030 Last data filed at 02/28/2023 1026 Gross per 24 hour  Intake 3 ml   Output --  Net 3 ml      02/28/2023   10:03 AM 06/08/2022   10:52 AM 12/20/2021    1:59 PM  Last 3 Weights  Weight (lbs) 175 lb 180 lb 3.2 oz 194 lb  Weight (kg) 79.379 kg 81.738 kg 87.998 kg     Body mass index is 29.57 kg/m.  General:  Well nourished, well developed, in no acute distress HEENT: normal swelling of nose Neck: no JVD Vascular: No carotid bruits; Distal pulses 2+ bilaterally Cardiac:  normal S1, S2; RRR; no murmur  Lungs:  clear to auscultation bilaterally, no wheezing, rhonchi or rales  Abd: soft, nontender, no hepatomegaly  Ext: no edema Musculoskeletal:  No deformities, BUE and BLE strength normal and equal Skin: warm and dry  Neuro:  CNs 2-12 intact, no focal abnormalities noted Psych:  Normal affect   EKG:  The EKG was personally reviewed and demonstrates:   Normal sinus rhythm no significant ST-T wave changes  Telemetry:  Telemetry was personally reviewed and demonstrates:   Telemetry being placed, ordered  Relevant CV Studies: Echo pending  Laboratory Data:  High Sensitivity Troponin:   Recent Labs  Lab 02/28/23 0427 02/28/23 0628  TROPONINIHS 12 30*     Chemistry Recent Labs  Lab 02/28/23 0547  NA 136  K 3.7  CL 103  CO2 26  GLUCOSE 231*  BUN 14  CREATININE 0.94  CALCIUM 8.3*  GFRNONAA >60  ANIONGAP 7    Recent Labs  Lab 02/28/23 0547  PROT 5.9*  ALBUMIN 3.7  AST 19  ALT 20  ALKPHOS 85  BILITOT 0.6   Lipids No results for input(s): "CHOL", "TRIG", "HDL", "LABVLDL", "LDLCALC", "CHOLHDL" in the last 168 hours.  Hematology Recent Labs  Lab 02/28/23 0427  WBC 17.0*  RBC 4.14  HGB 12.2  HCT 37.6  MCV 90.8  MCH 29.5  MCHC 32.4  RDW 12.3  PLT 285   Thyroid No results for input(s): "TSH", "FREET4" in the last 168 hours.  BNPNo results for input(s): "BNP", "PROBNP" in the last 168 hours.  DDimer No results for input(s): "DDIMER" in the last 168 hours.   Radiology/Studies:  CT Angio Chest PE W and/or Wo  Contrast  Result Date: 02/28/2023 CLINICAL DATA:  Syncope, lower abdominal pain, diarrhea, syncope EXAM: CT ANGIOGRAPHY CHEST CT ABDOMEN AND PELVIS WITH CONTRAST TECHNIQUE: Multidetector CT imaging of the chest was performed using the standard protocol during bolus administration of intravenous contrast. Multiplanar CT image reconstructions and MIPs were obtained to evaluate the vascular anatomy. Multidetector CT imaging of the abdomen and pelvis was performed using the standard protocol during bolus administration of intravenous contrast. RADIATION DOSE REDUCTION: This exam was performed according to the departmental dose-optimization program which includes automated exposure control, adjustment of the mA and/or kV according to patient size and/or use of iterative reconstruction technique. CONTRAST:  OMNIPAQUE IOHEXOL 350 MG/ML SOLN COMPARISON:  12/03/2008 FINDINGS: CTA CHEST FINDINGS Cardiovascular: Satisfactory opacification of the pulmonary arteries to the segmental level. No evidence of pulmonary embolism. Normal heart size. No pericardial effusion. No thoracic aortic aneurysm. No thoracic aortic dissection. Mild coronary artery atherosclerosis. Mild thoracic aortic atherosclerosis. Mediastinum/Nodes: No enlarged mediastinal, hilar, or axillary lymph  nodes. Trachea, and esophagus demonstrate no significant findings. Prior thyroidectomy. Lungs/Pleura: Lungs are clear. No pleural effusion or pneumothorax. Musculoskeletal: No chest wall abnormality. No acute or significant osseous findings. Review of the MIP images confirms the above findings. CT ABDOMEN and PELVIS FINDINGS Hepatobiliary: No focal liver abnormality is seen. Prior cholecystectomy. No biliary ductal dilatation. Mild osteoarthritis of bilateral SI joints. Pancreas: Unremarkable. No pancreatic ductal dilatation or surrounding inflammatory changes. Spleen: Normal in size without focal abnormality. Adrenals/Urinary Tract: Adrenal glands are  unremarkable. Kidneys are normal, without renal calculi, focal lesion, or hydronephrosis. Bladder is unremarkable. Stomach/Bowel: Stomach is within normal limits. No evidence of bowel wall thickening, distention, or inflammatory changes. Appendix is normal. Vascular/Lymphatic: No significant vascular findings are present. No enlarged abdominal or pelvic lymph nodes. Reproductive: Uterus and bilateral adnexa are unremarkable. Other: No abdominal wall hernia or abnormality. No abdominopelvic ascites. Musculoskeletal: No acute osseous abnormality. No aggressive osseous lesion. Degenerative disease with disc height loss at L5-S1 with facet arthropathy and foraminal stenosis. Review of the MIP images confirms the above findings. IMPRESSION: 1. No evidence of pulmonary embolus. 2. No acute cardiopulmonary disease. 3. No acute abdominal or pelvic pathology. 4.  Aortic Atherosclerosis (ICD10-I70.0). Electronically Signed   By: Elige Ko M.D.   On: 02/28/2023 08:12   CT ABDOMEN PELVIS W CONTRAST  Result Date: 02/28/2023 CLINICAL DATA:  Syncope, lower abdominal pain, diarrhea, syncope EXAM: CT ANGIOGRAPHY CHEST CT ABDOMEN AND PELVIS WITH CONTRAST TECHNIQUE: Multidetector CT imaging of the chest was performed using the standard protocol during bolus administration of intravenous contrast. Multiplanar CT image reconstructions and MIPs were obtained to evaluate the vascular anatomy. Multidetector CT imaging of the abdomen and pelvis was performed using the standard protocol during bolus administration of intravenous contrast. RADIATION DOSE REDUCTION: This exam was performed according to the departmental dose-optimization program which includes automated exposure control, adjustment of the mA and/or kV according to patient size and/or use of iterative reconstruction technique. CONTRAST:  OMNIPAQUE IOHEXOL 350 MG/ML SOLN COMPARISON:  12/03/2008 FINDINGS: CTA CHEST FINDINGS Cardiovascular: Satisfactory opacification  of the pulmonary arteries to the segmental level. No evidence of pulmonary embolism. Normal heart size. No pericardial effusion. No thoracic aortic aneurysm. No thoracic aortic dissection. Mild coronary artery atherosclerosis. Mild thoracic aortic atherosclerosis. Mediastinum/Nodes: No enlarged mediastinal, hilar, or axillary lymph nodes. Trachea, and esophagus demonstrate no significant findings. Prior thyroidectomy. Lungs/Pleura: Lungs are clear. No pleural effusion or pneumothorax. Musculoskeletal: No chest wall abnormality. No acute or significant osseous findings. Review of the MIP images confirms the above findings. CT ABDOMEN and PELVIS FINDINGS Hepatobiliary: No focal liver abnormality is seen. Prior cholecystectomy. No biliary ductal dilatation. Mild osteoarthritis of bilateral SI joints. Pancreas: Unremarkable. No pancreatic ductal dilatation or surrounding inflammatory changes. Spleen: Normal in size without focal abnormality. Adrenals/Urinary Tract: Adrenal glands are unremarkable. Kidneys are normal, without renal calculi, focal lesion, or hydronephrosis. Bladder is unremarkable. Stomach/Bowel: Stomach is within normal limits. No evidence of bowel wall thickening, distention, or inflammatory changes. Appendix is normal. Vascular/Lymphatic: No significant vascular findings are present. No enlarged abdominal or pelvic lymph nodes. Reproductive: Uterus and bilateral adnexa are unremarkable. Other: No abdominal wall hernia or abnormality. No abdominopelvic ascites. Musculoskeletal: No acute osseous abnormality. No aggressive osseous lesion. Degenerative disease with disc height loss at L5-S1 with facet arthropathy and foraminal stenosis. Review of the MIP images confirms the above findings. IMPRESSION: 1. No evidence of pulmonary embolus. 2. No acute cardiopulmonary disease. 3. No acute abdominal or pelvic pathology. 4.  Aortic Atherosclerosis (ICD10-I70.0). Electronically Signed   By: Elige Ko M.D.    On: 02/28/2023 08:12   CT HEAD WO CONTRAST ( )  Result Date: 02/28/2023 CLINICAL DATA:  Status post fall, syncope EXAM: CT HEAD WITHOUT CONTRAST CT MAXILLOFACIAL WITHOUT CONTRAST CT CERVICAL SPINE WITHOUT CONTRAST TECHNIQUE: Multidetector CT imaging of the head, cervical spine, and maxillofacial structures were performed using the standard protocol without intravenous contrast. Multiplanar CT image reconstructions of the cervical spine and maxillofacial structures were also generated. RADIATION DOSE REDUCTION: This exam was performed according to the departmental dose-optimization program which includes automated exposure control, adjustment of the mA and/or kV according to patient size and/or use of iterative reconstruction technique. COMPARISON:  None Available. FINDINGS: CT HEAD FINDINGS Brain: No evidence of acute infarction, hemorrhage, hydrocephalus, extra-axial collection or mass lesion/mass effect. Vascular: No hyperdense vessel or unexpected calcification. Skull: No osseous abnormality. Sinuses/Orbits: Visualized paranasal sinuses are clear. Visualized mastoid sinuses are clear. Visualized orbits demonstrate no focal abnormality. Other: None CT MAXILLOFACIAL FINDINGS Osseous: Subtle nondisplaced fracture along the right side of the bridge of the nose (image 35/series 9) with overlying soft tissue laceration and soft tissue emphysema. Orbits: Negative. No traumatic or inflammatory finding. Sinuses: Clear. Soft tissues: No fluid collection or hematoma. CT CERVICAL SPINE FINDINGS Alignment: Anatomic alignment.  No static listhesis. Skull base and vertebrae: No acute fracture. No aggressive lytic or sclerotic osseous lesion. Soft tissues and spinal canal: Intraspinal soft tissues are not fully imaged on this examination due to poor soft tissue contrast, but there is no gross soft tissue abnormality. No prevertebral fluid or swelling. No visible canal hematoma. Disc levels: Disc spaces are maintained. No  foraminal or central canal stenosis. Upper chest: Lung apices are clear. Other: No fluid collection or hematoma.  Prior thyroidectomy. IMPRESSION: 1. No acute intracranial pathology. 2. No acute osseous injury of the cervical spine. 3. Subtle nondisplaced fracture along the right side of the bridge of the nose with overlying soft tissue laceration and soft tissue emphysema. Electronically Signed   By: Elige Ko M.D.   On: 02/28/2023 08:05   CT Maxillofacial Wo Contrast  Result Date: 02/28/2023 CLINICAL DATA:  Status post fall, syncope EXAM: CT HEAD WITHOUT CONTRAST CT MAXILLOFACIAL WITHOUT CONTRAST CT CERVICAL SPINE WITHOUT CONTRAST TECHNIQUE: Multidetector CT imaging of the head, cervical spine, and maxillofacial structures were performed using the standard protocol without intravenous contrast. Multiplanar CT image reconstructions of the cervical spine and maxillofacial structures were also generated. RADIATION DOSE REDUCTION: This exam was performed according to the departmental dose-optimization program which includes automated exposure control, adjustment of the mA and/or kV according to patient size and/or use of iterative reconstruction technique. COMPARISON:  None Available. FINDINGS: CT HEAD FINDINGS Brain: No evidence of acute infarction, hemorrhage, hydrocephalus, extra-axial collection or mass lesion/mass effect. Vascular: No hyperdense vessel or unexpected calcification. Skull: No osseous abnormality. Sinuses/Orbits: Visualized paranasal sinuses are clear. Visualized mastoid sinuses are clear. Visualized orbits demonstrate no focal abnormality. Other: None CT MAXILLOFACIAL FINDINGS Osseous: Subtle nondisplaced fracture along the right side of the bridge of the nose (image 35/series 9) with overlying soft tissue laceration and soft tissue emphysema. Orbits: Negative. No traumatic or inflammatory finding. Sinuses: Clear. Soft tissues: No fluid collection or hematoma. CT CERVICAL SPINE FINDINGS  Alignment: Anatomic alignment.  No static listhesis. Skull base and vertebrae: No acute fracture. No aggressive lytic or sclerotic osseous lesion. Soft tissues and spinal canal: Intraspinal soft tissues are not fully imaged on this examination due to poor  soft tissue contrast, but there is no gross soft tissue abnormality. No prevertebral fluid or swelling. No visible canal hematoma. Disc levels: Disc spaces are maintained. No foraminal or central canal stenosis. Upper chest: Lung apices are clear. Other: No fluid collection or hematoma.  Prior thyroidectomy. IMPRESSION: 1. No acute intracranial pathology. 2. No acute osseous injury of the cervical spine. 3. Subtle nondisplaced fracture along the right side of the bridge of the nose with overlying soft tissue laceration and soft tissue emphysema. Electronically Signed   By: Elige Ko M.D.   On: 02/28/2023 08:05   CT Cervical Spine Wo Contrast  Result Date: 02/28/2023 CLINICAL DATA:  Status post fall, syncope EXAM: CT HEAD WITHOUT CONTRAST CT MAXILLOFACIAL WITHOUT CONTRAST CT CERVICAL SPINE WITHOUT CONTRAST TECHNIQUE: Multidetector CT imaging of the head, cervical spine, and maxillofacial structures were performed using the standard protocol without intravenous contrast. Multiplanar CT image reconstructions of the cervical spine and maxillofacial structures were also generated. RADIATION DOSE REDUCTION: This exam was performed according to the departmental dose-optimization program which includes automated exposure control, adjustment of the mA and/or kV according to patient size and/or use of iterative reconstruction technique. COMPARISON:  None Available. FINDINGS: CT HEAD FINDINGS Brain: No evidence of acute infarction, hemorrhage, hydrocephalus, extra-axial collection or mass lesion/mass effect. Vascular: No hyperdense vessel or unexpected calcification. Skull: No osseous abnormality. Sinuses/Orbits: Visualized paranasal sinuses are clear. Visualized  mastoid sinuses are clear. Visualized orbits demonstrate no focal abnormality. Other: None CT MAXILLOFACIAL FINDINGS Osseous: Subtle nondisplaced fracture along the right side of the bridge of the nose (image 35/series 9) with overlying soft tissue laceration and soft tissue emphysema. Orbits: Negative. No traumatic or inflammatory finding. Sinuses: Clear. Soft tissues: No fluid collection or hematoma. CT CERVICAL SPINE FINDINGS Alignment: Anatomic alignment.  No static listhesis. Skull base and vertebrae: No acute fracture. No aggressive lytic or sclerotic osseous lesion. Soft tissues and spinal canal: Intraspinal soft tissues are not fully imaged on this examination due to poor soft tissue contrast, but there is no gross soft tissue abnormality. No prevertebral fluid or swelling. No visible canal hematoma. Disc levels: Disc spaces are maintained. No foraminal or central canal stenosis. Upper chest: Lung apices are clear. Other: No fluid collection or hematoma.  Prior thyroidectomy. IMPRESSION: 1. No acute intracranial pathology. 2. No acute osseous injury of the cervical spine. 3. Subtle nondisplaced fracture along the right side of the bridge of the nose with overlying soft tissue laceration and soft tissue emphysema. Electronically Signed   By: Elige Ko M.D.   On: 02/28/2023 08:05   DG Knee 2 Views Right  Result Date: 02/28/2023 CLINICAL DATA:  Fall tonight with bilateral knee pain EXAM: RIGHT KNEE - 2 VIEW COMPARISON:  02/27/2016 FINDINGS: No evidence of fracture, dislocation, or joint effusion. Subjective osteopenia. 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 IMPRESSION: 1. No acute finding. 2. 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 Electronically Signed   By: Tiburcio Pea M.D.   On: 02/28/2023 05:36   DG Knee 2 Views Left  Result Date: 02/28/2023 CLINICAL DATA:  Fall  with bilateral knee pain. EXAM: LEFT KNEE - 2 VIEW COMPARISON:  None Available. FINDINGS: Mild degenerative marginal spurring. No acute fracture or subluxation. Negative for significant knee joint effusion. Subjective osteopenia. IMPRESSION: No acute finding. Electronically Signed   By: Tiburcio Pea M.D.   On: 02/28/2023 05:35  Assessment and Plan:   Syncope In the setting of abdominal discomfort, diarrhea middle of the night -Story concerning for vasovagal syncope -Echocardiogram pending, will order telemetry -If echo normal, would likely benefit from Zio monitor at discharge -No abnormal lab work -No plan for ischemic workup at this time  2.  Minimally elevated troponin, nontrending Likely supply/demand mismatch in the setting of syncope -Echocardiogram pending -No plan for ischemic workup at this time  3.  Essential hypertension Traumatic weight loss over the past year, no change to lisinopril dosing as outpatient, 20 mg Reports blood pressure typically runs 116 systolic at home -May need to decrease lisinopril down to 10 mg daily Orthostatics negative this morning  4.  Trauma Nondisplaced fracture of nose, sore ribs on right, bilateral knees sore Secondary to syncope, fall x 2 Scanning with CTs showing no acute fractures Recommend icing  5.  Diabetes type 2 with hyperglycemia Blood glucose elevated on arrival in the 200s On sliding scale insulin A1c pending   Total encounter time more than 80 minutes  Greater than 50% was spent in counseling and coordination of care with the patient   For questions or updates, please contact [facility] Please consult [email or web address] for contact info under    Signed, SHERI HAMMOCK, NP  02/28/2023 10:30 AM

## 2023-02-28 NOTE — Plan of Care (Signed)
Patient A&Ox4, from home, independent in room  

## 2023-02-28 NOTE — ED Notes (Signed)
Pt and family informed this RN pt did not eat breakfast, only drank the coffee.

## 2023-02-28 NOTE — ED Triage Notes (Signed)
Pt [facility] for a fall. Pt states she woke up, let her dog out and at that time took her thyroid medication when she became disoriented with severe abdominal pain. Patient did fall and sustained injury to knees bilaterally and nose. Nose has been bleeding but is controlled at this time.

## 2023-02-28 NOTE — Assessment & Plan Note (Signed)
Noted nasal bridge fracture status post syncope associated fall Nondisplaced Conservative management for now Follow

## 2023-02-28 NOTE — H&P (Addendum)
History and Physical    Patient: Nicole Crane XBJ:[id number] DOB: Oct 29, 1965 DOA: 02/28/2023 DOS: the patient was seen and examined on 02/28/2023 PCP: Noberto Retort, MD  Patient coming from: Home  Chief Complaint:  Chief Complaint  Patient presents with   Loss of Consciousness   HPI: Nicole Crane is a 57 y.o. female with medical history significant of obesity, postsurgical hypothyroidism, type 2 diabetes, hypertension, hyperlipidemia presenting with syncope and elevated troponin.  Patient ports attempted to take the dog earlier this morning with patient states that upon returning to bed she had a feeling of generalized dizziness as well as the need to have a bowel movement with secondary syncopal event.  Her husband heard her fall to the ground and immediately helped her up.  Patient states upon getting up that she began to have the sensation of another bowel movement to which her husband tried to get it to the bathroom and patient subsequently had another syncopal event falling on her face.  Patient denies any dysarthria or confusion prior to episode.  Does report some chronic right-sided weakness associated with radiculopathy/joint issues.  Does report having some mild chest pressure prior to going outside.  Denies any prior episodes like this in the past.  No fevers or chills.  No nausea or vomiting.  Noted baseline obesity.  Patient reports significant weight loss recently.  Patient reports significant family history of heart disease including brother with MI x 2 as well as another first-degree relative with coronary disease. Presented to the ER afebrile, hemodynamically stable.  Satting well on room air.  White count 17, hemoglobin 12.2, platelets 285, urinalysis stable, creatinine 0.94, troponin 12-30.  Imaging including CT head, maxillofacial CT, CT angio of the chest, CT of the abdomen and C-spine grossly stable apart from nondisplaced fracture of the right nasal bridge. Review  of Systems: As mentioned in the history of present illness. All other systems reviewed and are negative. Past Medical History:  Diagnosis Date   Allergic rhinitis    Allergy    Anxiety    Bilateral leg edema    DDD (degenerative disc disease), lumbosacral    Diabetes mellitus without complication (HCC)    GERD (gastroesophageal reflux disease)    High cholesterol    Hx of blood clots    post op in abdomen after BSO   Hypertension    Hyperthyroidism    Multiple thyroid nodules    OA (osteoarthritis) of shoulder    Spinal stenosis    Tick bite 02/14/2018   [place] tick   Past Surgical History:  Procedure Laterality Date   ABDOMINAL HYSTERECTOMY     complete   BACK SURGERY     BONE MARROW BIOPSY     after BSO to check clotting   CHOLECYSTECTOMY     DILATION AND CURETTAGE OF UTERUS     ENDOMETRIAL ABLATION     EXPLORATORY LAPAROTOMY     BSO   MOUTH SURGERY     ROTATOR CUFF REPAIR     THYROIDECTOMY N/A 02/26/2018   Procedure: TOTAL THYROIDECTOMY;  Surgeon: Darnell Level, MD;  Location: [facility];  Service: General;  Laterality: N/A;   Social History:  reports that she has never smoked. She has never used smokeless tobacco. She reports that she does not drink alcohol and does not use drugs.  Allergies  Allergen Reactions   Alcohol Hives, Shortness Of Breath and Swelling   Antihistamines, Diphenhydramine-Type Hives   Avelox [Moxifloxacin Hcl In Nacl]  Other (See Comments)    hallucinations.   Dilaudid [Hydromorphone Hcl] Hives and Itching   Erythromycin Base Hives   Kiwi Extract Itching    Lips itch   Levofloxacin Other (See Comments)    Hallucinations.   Morphine And Codeine Hives and Itching   Naproxen Other (See Comments)    Severe acid reflux--possible ulcers   Other     Anything with alcohol that she has to injest.   Penicillins Swelling and Other (See Comments)    Welts Has patient had a PCN reaction causing immediate rash, facial/tongue/throat swelling, SOB or  lightheadedness with hypotension: Yes--lips swelling Has patient had a PCN reaction causing severe rash involving mucus membranes or skin necrosis: No Has patient had a PCN reaction that required hospitalization: No Has patient had a PCN reaction occurring within the last 10 years: No If all of the above answers are "NO", then may proceed with Cephalosporin use.    Prednisone Other (See Comments)    Heart races with large doses/high dose.   Pregabalin Swelling and Other (See Comments)    Fluid retention caused weight gained 50 LBS of fluid in ~ 4 days   Statins Other (See Comments)    Muscle/joint aches & pains   Sulfa Antibiotics Nausea And Vomiting   Victoza [Liraglutide] Other (See Comments)    Caused visual disturbances (visual impairments)   Xigduo Xr [Dapagliflozin Pro-Metformin Er]     Causes UTI's    Family History  Problem Relation Age of Onset   Breast cancer Mother    Diabetes Brother    Breast cancer Maternal Grandmother    Thyroid disease Neg Hx    Colon cancer Neg Hx    Colon polyps Neg Hx    Esophageal cancer Neg Hx    Rectal cancer Neg Hx    Stomach cancer Neg Hx     Prior to Admission medications   Medication Sig Start Date End Date Taking? Authorizing Provider  albuterol (PROVENTIL HFA;VENTOLIN HFA) 108 (90 Base) MCG/ACT inhaler Inhale 1-2 puffs into the lungs every 6 (six) hours as needed for wheezing or shortness of breath.   Yes [provider]  ALPRAZolam Prudy Feeler) 0.5 MG tablet Take 0.5 mg by mouth at bedtime.   Yes [provider]  atorvastatin (LIPITOR) 40 MG tablet Take 40 mg by mouth every evening.   Yes [provider]  diclofenac Sodium (VOLTAREN) 1 % GEL Apply 2 g topically 4 (four) times daily.   Yes [provider]  ibuprofen (ADVIL,MOTRIN) 800 MG tablet Take 800 mg by mouth at bedtime.   Yes [provider]  levothyroxine (SYNTHROID) 75 MCG tablet Take 1 tablet (75 mcg total) by mouth daily. 06/08/22   Yes Reather Littler, MD  lidocaine (LIDODERM) 5 % Place 1 patch onto the skin daily.   Yes [provider]  liothyronine (CYTOMEL) 5 MCG tablet TAKE 1 TABLET BY MOUTH DAILY 02/12/23  Yes Reather Littler, MD  lisinopril (PRINIVIL,ZESTRIL) 20 MG tablet Take 20 mg by mouth daily.   Yes [provider]  metFORMIN (GLUCOPHAGE-XR) 500 MG 24 hr tablet TAKE FOUR TABLETS BY MOUTH DAILY; **MUST CALL MD FOR APPOINTMENT FOR FUTURE REFILLS 01/30/23  Yes Reather Littler, MD  pantoprazole (PROTONIX) 40 MG tablet Take 40 mg by mouth every evening.   Yes [provider]  Blood Glucose Monitoring Suppl (ONETOUCH VERIO FLEX SYSTEM) w/Device KIT Use onetouch verio flex to check blood sugar three times daily. 04/28/19   Reather Littler, MD  Continuous Glucose Sensor (FREESTYLE LIBRE 3 SENSOR) MISC APPLY EVERY 14 DAYS AS DIRECTED BY YOUR DOCTOR 01/17/23   Reather Littler, MD  Insulin Pen Needle 31G X 5 MM MISC Use to inject insulin 4 times daily. 06/24/19   Reather Littler, MD  MOUNJARO 5 MG/0.5ML Pen Inject 5 mg into the skin once a week.    [provider]  OneTouch Delica Lancets 30G MISC 1 each by Does not apply route 3 (three) times daily. Use Onetouch Delica Lancets to check blood sugar three times daily. 04/28/19   Reather Littler, MD  [facility] VERIO test strip USE TO CHECK BLOOD SUGAR 3 TIMES DAILY 09/29/19   Reather Littler, MD  TRULICITY 4.5 MG/0.5ML SOPN INJECT 4.5 MG UNDER THE SKIN ONCE WEEKLY Patient not taking: Reported on 02/28/2023 09/28/22   Reather Littler, MD    Physical Exam: Vitals:   02/28/23 0430 02/28/23 0630 02/28/23 0800 02/28/23 0830  BP: 123/68 (!) 133/57 125/68 111/60  Pulse: 92 91 98 96  Resp: 17 17 18 13   Temp:  98.4 F (36.9 C)    TempSrc:  Oral    SpO2: 100% 100% 100% 100%   Physical Exam Constitutional:      Appearance: She is obese.  HENT:     Head: Normocephalic.     Comments: + swelling and bruising across nasal bridge and eyes bilaterally     Mouth/Throat:     Mouth: Mucous  membranes are moist.  Eyes:     Pupils: Pupils are equal, round, and reactive to light.  Cardiovascular:     Rate and Rhythm: Normal rate and regular rhythm.  Pulmonary:     Effort: Pulmonary effort is normal.  Abdominal:     General: Bowel sounds are normal.  Musculoskeletal:        General: Normal range of motion.  Skin:    General: Skin is warm.  Neurological:     General: No focal deficit present.  Psychiatric:        Mood and Affect: Mood normal.     Data Reviewed:  There are no new results to review at this time. CT ABDOMEN PELVIS W CONTRAST CLINICAL DATA:  Syncope, lower abdominal pain, diarrhea, syncope  EXAM: CT ANGIOGRAPHY CHEST  CT ABDOMEN AND PELVIS WITH CONTRAST  TECHNIQUE: Multidetector CT imaging of the chest was performed using the standard protocol during bolus administration of intravenous contrast. Multiplanar CT image reconstructions and MIPs were obtained to evaluate the vascular anatomy. Multidetector CT imaging of the abdomen and pelvis was performed using the standard protocol during bolus administration of intravenous contrast.  RADIATION DOSE REDUCTION: This exam was performed according to the departmental dose-optimization program which includes automated exposure control, adjustment of the mA and/or kV according to patient size and/or use of iterative reconstruction technique.  CONTRAST:  OMNIPAQUE IOHEXOL 350 MG/ML SOLN  COMPARISON:  12/03/2008  FINDINGS: CTA CHEST FINDINGS  Cardiovascular: Satisfactory opacification of the pulmonary arteries to the segmental level. No evidence of pulmonary embolism. Normal heart size. No pericardial effusion. No thoracic aortic aneurysm. No thoracic aortic dissection. Mild coronary artery atherosclerosis. Mild thoracic aortic atherosclerosis.  Mediastinum/Nodes: No enlarged mediastinal, hilar, or axillary lymph nodes. Trachea, and esophagus demonstrate no significant findings. Prior  thyroidectomy.  Lungs/Pleura: Lungs are clear. No pleural effusion or pneumothorax.  Musculoskeletal: No chest wall abnormality. No acute or significant osseous findings.  Review of the MIP images confirms the above findings.  CT ABDOMEN and PELVIS FINDINGS  Hepatobiliary: No focal  liver abnormality is seen. Prior cholecystectomy. No biliary ductal dilatation. Mild osteoarthritis of bilateral SI joints.  Pancreas: Unremarkable. No pancreatic ductal dilatation or surrounding inflammatory changes.  Spleen: Normal in size without focal abnormality.  Adrenals/Urinary Tract: Adrenal glands are unremarkable. Kidneys are normal, without renal calculi, focal lesion, or hydronephrosis. Bladder is unremarkable.  Stomach/Bowel: Stomach is within normal limits. No evidence of bowel wall thickening, distention, or inflammatory changes. Appendix is normal.  Vascular/Lymphatic: No significant vascular findings are present. No enlarged abdominal or pelvic lymph nodes.  Reproductive: Uterus and bilateral adnexa are unremarkable.  Other: No abdominal wall hernia or abnormality. No abdominopelvic ascites.  Musculoskeletal: No acute osseous abnormality. No aggressive osseous lesion. Degenerative disease with disc height loss at L5-S1 with facet arthropathy and foraminal stenosis.  Review of the MIP images confirms the above findings.  IMPRESSION: 1. No evidence of pulmonary embolus. 2. No acute cardiopulmonary disease. 3. No acute abdominal or pelvic pathology. 4.  Aortic Atherosclerosis (ICD10-I70.0).  Electronically Signed   By: Elige Ko M.D.   On: 02/28/2023 08:12 CT Angio Chest PE W and/or Wo Contrast CLINICAL DATA:  Syncope, lower abdominal pain, diarrhea, syncope  EXAM: CT ANGIOGRAPHY CHEST  CT ABDOMEN AND PELVIS WITH CONTRAST  TECHNIQUE: Multidetector CT imaging of the chest was performed using the standard protocol during bolus administration of  intravenous contrast. Multiplanar CT image reconstructions and MIPs were obtained to evaluate the vascular anatomy. Multidetector CT imaging of the abdomen and pelvis was performed using the standard protocol during bolus administration of intravenous contrast.  RADIATION DOSE REDUCTION: This exam was performed according to the departmental dose-optimization program which includes automated exposure control, adjustment of the mA and/or kV according to patient size and/or use of iterative reconstruction technique.  CONTRAST:  OMNIPAQUE IOHEXOL 350 MG/ML SOLN  COMPARISON:  12/03/2008  FINDINGS: CTA CHEST FINDINGS  Cardiovascular: Satisfactory opacification of the pulmonary arteries to the segmental level. No evidence of pulmonary embolism. Normal heart size. No pericardial effusion. No thoracic aortic aneurysm. No thoracic aortic dissection. Mild coronary artery atherosclerosis. Mild thoracic aortic atherosclerosis.  Mediastinum/Nodes: No enlarged mediastinal, hilar, or axillary lymph nodes. Trachea, and esophagus demonstrate no significant findings. Prior thyroidectomy.  Lungs/Pleura: Lungs are clear. No pleural effusion or pneumothorax.  Musculoskeletal: No chest wall abnormality. No acute or significant osseous findings.  Review of the MIP images confirms the above findings.  CT ABDOMEN and PELVIS FINDINGS  Hepatobiliary: No focal liver abnormality is seen. Prior cholecystectomy. No biliary ductal dilatation. Mild osteoarthritis of bilateral SI joints.  Pancreas: Unremarkable. No pancreatic ductal dilatation or surrounding inflammatory changes.  Spleen: Normal in size without focal abnormality.  Adrenals/Urinary Tract: Adrenal glands are unremarkable. Kidneys are normal, without renal calculi, focal lesion, or hydronephrosis. Bladder is unremarkable.  Stomach/Bowel: Stomach is within normal limits. No evidence of bowel wall thickening, distention, or  inflammatory changes. Appendix is normal.  Vascular/Lymphatic: No significant vascular findings are present. No enlarged abdominal or pelvic lymph nodes.  Reproductive: Uterus and bilateral adnexa are unremarkable.  Other: No abdominal wall hernia or abnormality. No abdominopelvic ascites.  Musculoskeletal: No acute osseous abnormality. No aggressive osseous lesion. Degenerative disease with disc height loss at L5-S1 with facet arthropathy and foraminal stenosis.  Review of the MIP images confirms the above findings.  IMPRESSION: 1. No evidence of pulmonary embolus. 2. No acute cardiopulmonary disease. 3. No acute abdominal or pelvic pathology. 4.  Aortic Atherosclerosis (ICD10-I70.0).  Electronically Signed   By: Dillard Cannon.D.  On: 02/28/2023 08:12 CT Maxillofacial Wo Contrast CLINICAL DATA:  Status post fall, syncope  EXAM: CT HEAD WITHOUT CONTRAST  CT MAXILLOFACIAL WITHOUT CONTRAST  CT CERVICAL SPINE WITHOUT CONTRAST  TECHNIQUE: Multidetector CT imaging of the head, cervical spine, and maxillofacial structures were performed using the standard protocol without intravenous contrast. Multiplanar CT image reconstructions of the cervical spine and maxillofacial structures were also generated.  RADIATION DOSE REDUCTION: This exam was performed according to the departmental dose-optimization program which includes automated exposure control, adjustment of the mA and/or kV according to patient size and/or use of iterative reconstruction technique.  COMPARISON:  None Available.  FINDINGS: CT HEAD FINDINGS  Brain: No evidence of acute infarction, hemorrhage, hydrocephalus, extra-axial collection or mass lesion/mass effect.  Vascular: No hyperdense vessel or unexpected calcification.  Skull: No osseous abnormality.  Sinuses/Orbits: Visualized paranasal sinuses are clear. Visualized mastoid sinuses are clear. Visualized orbits demonstrate no  focal abnormality.  Other: None  CT MAXILLOFACIAL FINDINGS  Osseous: Subtle nondisplaced fracture along the right side of the bridge of the nose (image 35/series 9) with overlying soft tissue laceration and soft tissue emphysema.  Orbits: Negative. No traumatic or inflammatory finding.  Sinuses: Clear.  Soft tissues: No fluid collection or hematoma.  CT CERVICAL SPINE FINDINGS  Alignment: Anatomic alignment.  No static listhesis.  Skull base and vertebrae: No acute fracture. No aggressive lytic or sclerotic osseous lesion.  Soft tissues and spinal canal: Intraspinal soft tissues are not fully imaged on this examination due to poor soft tissue contrast, but there is no gross soft tissue abnormality. No prevertebral fluid or swelling. No visible canal hematoma.  Disc levels: Disc spaces are maintained. No foraminal or central canal stenosis.  Upper chest: Lung apices are clear.  Other: No fluid collection or hematoma.  Prior thyroidectomy.  IMPRESSION: 1. No acute intracranial pathology. 2. No acute osseous injury of the cervical spine. 3. Subtle nondisplaced fracture along the right side of the bridge of the nose with overlying soft tissue laceration and soft tissue emphysema.  Electronically Signed   By: Elige Ko M.D.   On: 02/28/2023 08:05 CT Cervical Spine Wo Contrast CLINICAL DATA:  Status post fall, syncope  EXAM: CT HEAD WITHOUT CONTRAST  CT MAXILLOFACIAL WITHOUT CONTRAST  CT CERVICAL SPINE WITHOUT CONTRAST  TECHNIQUE: Multidetector CT imaging of the head, cervical spine, and maxillofacial structures were performed using the standard protocol without intravenous contrast. Multiplanar CT image reconstructions of the cervical spine and maxillofacial structures were also generated.  RADIATION DOSE REDUCTION: This exam was performed according to the departmental dose-optimization program which includes automated exposure control, adjustment of the  mA and/or kV according to patient size and/or use of iterative reconstruction technique.  COMPARISON:  None Available.  FINDINGS: CT HEAD FINDINGS  Brain: No evidence of acute infarction, hemorrhage, hydrocephalus, extra-axial collection or mass lesion/mass effect.  Vascular: No hyperdense vessel or unexpected calcification.  Skull: No osseous abnormality.  Sinuses/Orbits: Visualized paranasal sinuses are clear. Visualized mastoid sinuses are clear. Visualized orbits demonstrate no focal abnormality.  Other: None  CT MAXILLOFACIAL FINDINGS  Osseous: Subtle nondisplaced fracture along the right side of the bridge of the nose (image 35/series 9) with overlying soft tissue laceration and soft tissue emphysema.  Orbits: Negative. No traumatic or inflammatory finding.  Sinuses: Clear.  Soft tissues: No fluid collection or hematoma.  CT CERVICAL SPINE FINDINGS  Alignment: Anatomic alignment.  No static listhesis.  Skull base and vertebrae: No acute fracture. No aggressive lytic or sclerotic osseous  lesion.  Soft tissues and spinal canal: Intraspinal soft tissues are not fully imaged on this examination due to poor soft tissue contrast, but there is no gross soft tissue abnormality. No prevertebral fluid or swelling. No visible canal hematoma.  Disc levels: Disc spaces are maintained. No foraminal or central canal stenosis.  Upper chest: Lung apices are clear.  Other: No fluid collection or hematoma.  Prior thyroidectomy.  IMPRESSION: 1. No acute intracranial pathology. 2. No acute osseous injury of the cervical spine. 3. Subtle nondisplaced fracture along the right side of the bridge of the nose with overlying soft tissue laceration and soft tissue emphysema.  Electronically Signed   By: Elige Ko M.D.   On: 02/28/2023 08:05 CT HEAD WO CONTRAST ( ) CLINICAL DATA:  Status post fall, syncope  EXAM: CT HEAD WITHOUT CONTRAST  CT MAXILLOFACIAL WITHOUT  CONTRAST  CT CERVICAL SPINE WITHOUT CONTRAST  TECHNIQUE: Multidetector CT imaging of the head, cervical spine, and maxillofacial structures were performed using the standard protocol without intravenous contrast. Multiplanar CT image reconstructions of the cervical spine and maxillofacial structures were also generated.  RADIATION DOSE REDUCTION: This exam was performed according to the departmental dose-optimization program which includes automated exposure control, adjustment of the mA and/or kV according to patient size and/or use of iterative reconstruction technique.  COMPARISON:  None Available.  FINDINGS: CT HEAD FINDINGS  Brain: No evidence of acute infarction, hemorrhage, hydrocephalus, extra-axial collection or mass lesion/mass effect.  Vascular: No hyperdense vessel or unexpected calcification.  Skull: No osseous abnormality.  Sinuses/Orbits: Visualized paranasal sinuses are clear. Visualized mastoid sinuses are clear. Visualized orbits demonstrate no focal abnormality.  Other: None  CT MAXILLOFACIAL FINDINGS  Osseous: Subtle nondisplaced fracture along the right side of the bridge of the nose (image 35/series 9) with overlying soft tissue laceration and soft tissue emphysema.  Orbits: Negative. No traumatic or inflammatory finding.  Sinuses: Clear.  Soft tissues: No fluid collection or hematoma.  CT CERVICAL SPINE FINDINGS  Alignment: Anatomic alignment.  No static listhesis.  Skull base and vertebrae: No acute fracture. No aggressive lytic or sclerotic osseous lesion.  Soft tissues and spinal canal: Intraspinal soft tissues are not fully imaged on this examination due to poor soft tissue contrast, but there is no gross soft tissue abnormality. No prevertebral fluid or swelling. No visible canal hematoma.  Disc levels: Disc spaces are maintained. No foraminal or central canal stenosis.  Upper chest: Lung apices are clear.  Other: No fluid  collection or hematoma.  Prior thyroidectomy.  IMPRESSION: 1. No acute intracranial pathology. 2. No acute osseous injury of the cervical spine. 3. Subtle nondisplaced fracture along the right side of the bridge of the nose with overlying soft tissue laceration and soft tissue emphysema.  Electronically Signed   By: Elige Ko M.D.   On: 02/28/2023 08:05 DG Knee 2 Views Right CLINICAL DATA:  Fall tonight with bilateral knee pain  EXAM: RIGHT KNEE - 2 VIEW  COMPARISON:  02/27/2016  FINDINGS: No evidence of fracture, dislocation, or joint effusion. Subjective osteopenia. 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  IMPRESSION: 1. No acute finding. 2. 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  Electronically Signed   By: Tiburcio Pea M.D.   On: 02/28/2023 05:36 DG Knee 2 Views Left CLINICAL DATA:  Fall with bilateral knee pain.  EXAM: LEFT KNEE - 2 VIEW  COMPARISON:  None Available.  FINDINGS: Mild degenerative marginal spurring. No acute fracture or subluxation. Negative for significant knee joint effusion. Subjective osteopenia.  IMPRESSION: No acute finding.  Electronically Signed   By: Tiburcio Pea M.D.   On: 02/28/2023 05:35  Lab Results  Component Value Date   WBC 17.0 (H) 02/28/2023   HGB 12.2 02/28/2023   HCT 37.6 02/28/2023   MCV 90.8 02/28/2023   PLT 285 02/28/2023   Last metabolic panel Lab Results  Component Value Date   GLUCOSE 231 (H) 02/28/2023   NA 136 02/28/2023   K 3.7 02/28/2023   CL 103 02/28/2023   CO2 26 02/28/2023   BUN 14 02/28/2023   CREATININE 0.94 02/28/2023   GFRNONAA >60 02/28/2023   CALCIUM 8.3 (L) 02/28/2023   PROT 5.9 (L) 02/28/2023   ALBUMIN 3.7 02/28/2023   BILITOT 0.6 02/28/2023   ALKPHOS 85 02/28/2023   AST 19 02/28/2023   ALT 20 02/28/2023   ANIONGAP 7 02/28/2023     Assessment and Plan: * Syncope Positive syncopal episode at home x 2 associated with bowel movements Also with concurrent mildly elevated troponin in the 30s Nonfocal neuroexam Differential diagnosis includes vasovagal versus orthostatic versus cardiac etiology Check orthostatics 2D echo Will formally consult cardiology given mildly elevated troponin with secondary syncope as well as multiple risk factors including obesity, type 2 diabetes, hypertension, hyperlipidemia  Elevated troponin Troponin 30s in the setting of syncopal event x 2 at home EKG normal sinus rhythm Given syncope, will plan for formal cardiology consultation Risk factors include obesity, hypertension, type 2 diabetes, hyperlipidemia Will check risk stratification labs Baby aspirin 2D echo Follow-up formal cardiology recommendations  Nasal fracture Noted nasal bridge fracture status post syncope associated fall Nondisplaced Conservative management for now Follow  Hypertension BP stable  Titrate home regimen   Hyperlipidemia Continue statin pending lipid panel  Uncontrolled type 2 diabetes mellitus with hyperglycemia, without long-term current use of insulin (HCC) Blood sugar in 200s Sliding-scale insulin A1c  Post-surgical hypothyroidism Continue Synthroid and Cytomel      Advance Care Planning:   Code Status: Full Code   Consults: Cardiology   Family Communication: Husband at the bedside   Severity of Illness: The appropriate patient status for this patient is INPATIENT. Inpatient status is judged to be reasonable and necessary in order to provide the required intensity of service to ensure the patient's safety. The patient's presenting symptoms, physical exam findings, and initial radiographic and laboratory data in the context of their chronic comorbidities is felt to place them at high risk for further clinical deterioration. Furthermore, it is not anticipated that the patient will be  medically stable for discharge from the hospital within 2 midnights of admission.   * I certify that at the point of admission it is my clinical judgment that the patient will require inpatient hospital care spanning beyond 2 midnights from the point of admission due to high intensity of service, high risk for further deterioration and high frequency of surveillance required.*  Greater than 50% was spent in counseling and coordination of care with patient Total encounter time 80 minutes or more  Author: Floydene Flock, MD 02/28/2023 10:14 AM  For on call review [email or web address].

## 2023-02-28 NOTE — Assessment & Plan Note (Signed)
Positive syncopal episode at home x 2 associated with bowel movements Also with concurrent mildly elevated troponin in the 30s Nonfocal neuroexam Differential diagnosis includes vasovagal versus orthostatic versus cardiac etiology Check orthostatics 2D echo Will formally consult cardiology given mildly elevated troponin with secondary syncope as well as multiple risk factors including obesity, type 2 diabetes, hypertension, hyperlipidemia

## 2023-02-28 NOTE — Progress Notes (Signed)
*  PRELIMINARY RESULTS* Echocardiogram 2D Echocardiogram has been performed.  Nicole Crane 02/28/2023, 2:19 PM

## 2023-02-28 NOTE — Assessment & Plan Note (Signed)
Troponin 30s in the setting of syncopal event x 2 at home EKG normal sinus rhythm Given syncope, will plan for formal cardiology consultation Risk factors include obesity, hypertension, type 2 diabetes, hyperlipidemia Will check risk stratification labs Baby aspirin 2D echo Follow-up formal cardiology recommendations

## 2023-02-28 NOTE — Assessment & Plan Note (Signed)
Continue statin pending lipid panel

## 2023-02-28 NOTE — ED Provider Notes (Signed)
[facility] Provider Note    Event Date/Time   First MD Initiated Contact with Patient 02/28/23 [phone]     (approximate)   History   Loss of Consciousness   HPI  Nicole Crane is a 57 y.o. female who presents to the ED for evaluation of Loss of Consciousness   I review endocrine clinic visit from September 2023.  History of DM, hypothyroidism, HTN, GERD  Patient presents to the ED from home via EMS for evaluation of multiple complaints just in the past couple hours.  She reports being at her baseline before going to bed without recent illnesses or concerns.  She woke around 2 AM to let their young puppy out.  Initially okay, she put the puppy back in the cage and took her thyroid medicine because she was already up.  After laying back in bed, she reports feeling "weird" with nausea, sensation of impending diarrhea, dizziness.  She was frequently calling out for her husband and he reports that when trying to help her to the bathroom she had 2 falls and possible syncopal episodes.  First she fell into a recliner, face forward.  After getting her back up from this she face planted in the bathroom onto the tile floor.  Apparently had syncope and was out for 1-2 minutes without seizure activity.  She had large watery diarrhea all over the floor of the bathroom, no blood.  No emesis.    She had a nosebleed after face planting in the bathroom but this has since stopped.  She also reports a transient episode of chest discomfort and difficulty breathing associated with her syncope.  Reports her chest discomfort is better.  No back pain  Now, some persistent nausea but no abdominal pain, chest pain  Physical Exam   Triage Vital Signs: ED Triage Vitals  Enc Vitals Group     BP 02/28/23 0405 (!) 142/61     Pulse Rate 02/28/23 0405 96     Resp 02/28/23 0405 17     Temp 02/28/23 0405 98.4 F (36.9 C)     Temp Source 02/28/23 0405 Oral     SpO2 02/28/23 0405  100 %     Weight --      Height --      Head Circumference --      Peak Flow --      Pain Score 02/28/23 0406 4     Pain Loc --      Pain Edu? --      Excl. in GC? --     Most recent vital signs: Vitals:   02/28/23 0430 02/28/23 0630  BP: 123/68 (!) 133/57  Pulse: 92 91  Resp: 17 17  Temp:  98.4 F (36.9 C)  SpO2: 100% 100%    General: Awake, no distress.  Dried blood in the bilateral nares without active bleeding anteriorly or posteriorly.  No nasal septal hematoma that I can appreciate CV:  Good peripheral perfusion.  Resp:  Normal effort.  Abd:  No distention.  Soft and benign MSK:  No deformity noted.  Superficial abrasions to bilateral knees without discrete laceration or evidence of injury.  No gross deformity.  Palpation and ranging of all extremities otherwise without evidence of acute traumatic pathology. Neuro:  No focal deficits appreciated. Cranial nerves II through XII intact 5/5 strength and sensation in all 4 extremities Other:     ED Results / Procedures / Treatments   Labs (all labs ordered  are listed, but only abnormal results are displayed) Labs Reviewed  CBC WITH DIFFERENTIAL/PLATELET - Abnormal; Notable for the following components:      Result Value   WBC 17.0 (*)    Neutro Abs 13.5 (*)    Abs Immature Granulocytes 0.09 (*)    All other components within normal limits  COMPREHENSIVE METABOLIC PANEL - Abnormal; Notable for the following components:   Glucose, Bld 231 (*)    Calcium 8.3 (*)    Total Protein 5.9 (*)    All other components within normal limits  LIPASE, BLOOD  URINALYSIS, ROUTINE W REFLEX MICROSCOPIC  TROPONIN I (HIGH SENSITIVITY)  TROPONIN I (HIGH SENSITIVITY)    EKG Sinus rhythm with a rate of 83 bpm.  Normal axis and intervals.  No clear signs of acute ischemia.  RADIOLOGY Plain films of the bilateral knees interpreted by me without evidence of fracture or dislocation  Official radiology report(s): DG Knee 2 Views  Right  Result Date: 02/28/2023 CLINICAL DATA:  Fall tonight with bilateral knee pain EXAM: RIGHT KNEE - 2 VIEW COMPARISON:  02/27/2016 FINDINGS: No evidence of fracture, dislocation, or joint effusion. Subjective osteopenia. 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 IMPRESSION: 1. No acute finding. 2. 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 Electronically Signed   By: Tiburcio Pea M.D.   On: 02/28/2023 05:36   DG Knee 2 Views Left  Result Date: 02/28/2023 CLINICAL DATA:  Fall with bilateral knee pain. EXAM: LEFT KNEE - 2 VIEW COMPARISON:  None Available. FINDINGS: Mild degenerative marginal spurring. No acute fracture or subluxation. Negative for significant knee joint effusion. Subjective osteopenia. IMPRESSION: No acute finding. Electronically Signed   By: Tiburcio Pea M.D.   On: 02/28/2023 05:35    PROCEDURES and INTERVENTIONS:  .1-3 Lead EKG Interpretation  Performed by: Delton Prairie, MD Authorized by: Delton Prairie, MD     Interpretation: normal     ECG rate:  94   ECG rate assessment: normal     Rhythm: sinus rhythm     Ectopy: none     Conduction: normal     Medications  lactated ringers bolus 1,000 mL (0 mLs Intravenous Stopped 02/28/23 0622)  ondansetron (ZOFRAN) injection 4 mg (4 mg Intravenous Given 02/28/23 0443)  iohexol (OMNIPAQUE) 350 MG/ML injection 100 mL (100 mLs Intravenous Contrast Given 02/28/23 0647)     IMPRESSION / MDM / ASSESSMENT AND PLAN / ED COURSE  I reviewed the triage vital signs and the nursing notes.  Differential diagnosis includes, but is not limited to, PE, AKI, dehydration, vasovagal episode, seizure, gastroenteritis,  {Patient presents with symptoms of an acute illness or injury that is potentially life-threatening.  57 year old woman presents from home after syncopal episode associated with diarrhea and resolved  chest discomfort.  Resolving symptoms on arrival to the ED and look systemically well and has a fairly benign exam.  She has resolved nosebleed and abrasions superficially bilateral knees, but otherwise no acute derangements.  Blood work with nonspecific leukocytosis.  Metabolic panel with hyperglycemia without acidosis.  Troponin and lipase are negative.  Awaiting cross-sectional imaging at the time of signout  Clinical Course as of 02/28/23 0700  Wed Feb 28, 2023  1610 Reassessed. No needs [DS]    Clinical Course User Index [DS] Delton Prairie, MD     FINAL CLINICAL IMPRESSION(S) / ED DIAGNOSES   Final diagnoses:  Syncope and collapse  Diarrhea, unspecified type     Rx / DC Orders   ED Discharge Orders     None        Note:  This document was prepared using Dragon voice recognition software and may include unintentional dictation errors.   Delton Prairie, MD 02/28/23 0700

## 2023-03-01 ENCOUNTER — Inpatient Hospital Stay: Payer: BC Managed Care – PPO

## 2023-03-01 DIAGNOSIS — R197 Diarrhea, unspecified: Secondary | ICD-10-CM | POA: Diagnosis not present

## 2023-03-01 DIAGNOSIS — R55 Syncope and collapse: Secondary | ICD-10-CM | POA: Diagnosis not present

## 2023-03-01 DIAGNOSIS — R7989 Other specified abnormal findings of blood chemistry: Secondary | ICD-10-CM | POA: Diagnosis not present

## 2023-03-01 DIAGNOSIS — I10 Essential (primary) hypertension: Secondary | ICD-10-CM | POA: Diagnosis not present

## 2023-03-01 LAB — COMPREHENSIVE METABOLIC PANEL
ALT: 18 U/L (ref 0–44)
AST: 15 U/L (ref 15–41)
Albumin: 3.5 g/dL (ref 3.5–5.0)
Alkaline Phosphatase: 70 U/L (ref 38–126)
Anion gap: 7 (ref 5–15)
BUN: 9 mg/dL (ref 6–20)
CO2: 25 mmol/L (ref 22–32)
Calcium: 8.1 mg/dL — ABNORMAL LOW (ref 8.9–10.3)
Chloride: 109 mmol/L (ref 98–111)
Creatinine, Ser: 0.83 mg/dL (ref 0.44–1.00)
GFR, Estimated: >60 mL/min (ref >60)
Glucose, Bld: 117 mg/dL — ABNORMAL HIGH (ref 70–99)
Potassium: 3.8 mmol/L (ref 3.5–5.1)
Sodium: 141 mmol/L (ref 135–145)
Total Bilirubin: 0.8 mg/dL (ref 0.3–1.2)
Total Protein: 5.8 g/dL — ABNORMAL LOW (ref 6.5–8.1)

## 2023-03-01 LAB — CBC
HCT: 33 % — ABNORMAL LOW (ref 36.0–46.0)
Hemoglobin: 10.8 g/dL — ABNORMAL LOW (ref 12.0–15.0)
MCH: 29.6 pg (ref 26.0–34.0)
MCHC: 32.7 g/dL (ref 30.0–36.0)
MCV: 90.4 fL (ref 80.0–100.0)
Platelets: 220 10*3/uL (ref 150–400)
RBC: 3.65 MIL/uL — ABNORMAL LOW (ref 3.87–5.11)
RDW: 12.6 % (ref 11.5–15.5)
WBC: 8 10*3/uL (ref 4.0–10.5)
nRBC: 0 % (ref 0.0–0.2)

## 2023-03-01 LAB — GLUCOSE, CAPILLARY
Glucose-Capillary: 116 mg/dL — ABNORMAL HIGH (ref 70–99)
Glucose-Capillary: 105 mg/dL — ABNORMAL HIGH (ref 70–99)
Glucose-Capillary: 131 mg/dL — ABNORMAL HIGH (ref 70–99)
Glucose-Capillary: 113 mg/dL — ABNORMAL HIGH (ref 70–99)

## 2023-03-01 MED ORDER — TRAMADOL HCL 50 MG PO TABS
50.0000 mg | ORAL_TABLET | Freq: Four times a day (QID) | ORAL | Status: DC | PRN
  Administered 2023-03-01: 50 mg via ORAL
  Filled 2023-03-01 (×2): qty 1

## 2023-03-01 MED ORDER — LIDOCAINE 5 % EX PTCH
1.0000 | MEDICATED_PATCH | CUTANEOUS | Status: DC
  Administered 2023-03-01: 1 via TRANSDERMAL

## 2023-03-01 NOTE — TOC Progression Note (Signed)
Transition of Care [facility]) - Progression Note    Patient Details  Name: Nicole Crane MRN: [id number] Date of Birth: March 19, 1966  Transition of Care [facility]) CM/SW Contact  Marlowe Sax, RN Phone Number: 03/01/2023, 9:11 AM  Clinical Narrative:    Transition of Care [facility]) - Inpatient Brief Assessment   Patient Details  Name: Nicole Crane MRN: [id number] Date of Birth: 06-10-1966  Transition of Care [facility]) CM/SW Contact:    Marlowe Sax, RN Phone Number: 03/01/2023, 9:11 AM   Clinical Narrative:  No identified needs at this time  Transition of Care Asessment: Insurance and Status: Insurance coverage has been reviewed Patient has primary care physician: Yes Tiburcio Pea, Tawni Pummel, MD) Home environment has been reviewed: home with Husband Prior level of function:: independent Prior/Current Home Services: No current home services Social Determinants of Health Reivew: SDOH reviewed no interventions necessary Readmission risk has been reviewed: No (not high risk) Transition of care needs: no transition of care needs at this time         Expected Discharge Plan and Services                                               Social Determinants of Health (SDOH) Interventions SDOH Screenings   Food Insecurity: No Food Insecurity (02/28/2023)  Housing: Low Risk  (02/28/2023)  Transportation Needs: No Transportation Needs (02/28/2023)  Utilities: Not At Risk (02/28/2023)  Tobacco Use: Low Risk  (02/28/2023)    Readmission Risk Interventions     No data to display

## 2023-03-01 NOTE — Progress Notes (Signed)
Progress Note  Patient Name: Nicole Crane Date of Encounter: 03/01/2023  Primary Cardiologist: new - consult by Gollan   Subjective   No chest pain, dyspnea, or palpitations. No further syncope. Hgb trend 12.2 to 10.8 over the past 24 hours (on IV fluids).  Inpatient Medications    Scheduled Meds:  atorvastatin  40 mg Oral QPM   enoxaparin (LOVENOX) injection  40 mg Subcutaneous Q24H   insulin aspart  0-5 Units Subcutaneous QHS   insulin aspart  0-9 Units Subcutaneous TID WC   insulin aspart  3 Units Subcutaneous TID WC   levothyroxine  75 mcg Oral Q0600   liothyronine  5 mcg Oral Daily   pantoprazole  40 mg Oral BID   sodium chloride flush  3 mL Intravenous Q12H   Continuous Infusions:  sodium chloride 75 mL/hr at 02/28/23 1024   PRN Meds: acetaminophen, ALPRAZolam, ondansetron **OR** ondansetron (ZOFRAN) IV, traMADol   Vital Signs    Vitals:   02/28/23 1258 02/28/23 2330 03/01/23 0736 03/01/23 1202  BP: 127/63 128/62 (!) 145/73 139/68  Pulse: 92 73    Resp: 19 20 16 16   Temp: 98.2 F (36.8 C) 98.3 F (36.8 C) 98.6 F (37 C) 98.4 F (36.9 C)  TempSrc:   Oral Oral  SpO2: 100% 99% 99% 99%  Weight:      Height:        Intake/Output Summary (Last 24 hours) at 03/01/2023 1206 Last data filed at 03/01/2023 0955 Gross per 24 hour  Intake 240 ml  Output --  Net 240 ml   Filed Weights   02/28/23 1003  Weight: 79.4 kg    Telemetry    SR with intermittent 1st degree AV block, short atrial tachycardic run - Personally Reviewed  ECG    No new tracings - Personally Reviewed  Physical Exam   GEN: No acute distress.  Bilateral orbital contusion.  Neck: No JVD. Cardiac: RRR, no murmurs, rubs, or gallops.  Respiratory: Clear to auscultation bilaterally.  GI: Soft, nontender, non-distended.   MS: No edema; No deformity. Neuro:  Alert and oriented x 3; Nonfocal.  Psych: Normal affect.  Labs    Chemistry Recent Labs  Lab 02/28/23 0547  03/01/23 0359  NA 136 141  K 3.7 3.8  CL 103 109  CO2 26 25  GLUCOSE 231* 117*  BUN 14 9  CREATININE 0.94 0.83  CALCIUM 8.3* 8.1*  PROT 5.9* 5.8*  ALBUMIN 3.7 3.5  AST 19 15  ALT 20 18  ALKPHOS 85 70  BILITOT 0.6 0.8  GFRNONAA >60 >60  ANIONGAP 7 7     Hematology Recent Labs  Lab 02/28/23 0427 03/01/23 0359  WBC 17.0* 8.0  RBC 4.14 3.65*  HGB 12.2 10.8*  HCT 37.6 33.0*  MCV 90.8 90.4  MCH 29.5 29.6  MCHC 32.4 32.7  RDW 12.3 12.6  PLT 285 220    Cardiac EnzymesNo results for input(s): "TROPONINI" in the last 168 hours. No results for input(s): "TROPIPOC" in the last 168 hours.   BNPNo results for input(s): "BNP", "PROBNP" in the last 168 hours.   DDimer No results for input(s): "DDIMER" in the last 168 hours.   Radiology    DG Chest Port 1 View  Result Date: 03/01/2023 IMPRESSION: No evidence of active cardiopulmonary process. Electronically Signed   By: Carey Bullocks M.D.   On: 03/01/2023 10:53   CT Angio Chest PE W and/or Wo Contrast  Result Date: 02/28/2023 IMPRESSION: 1. No  evidence of pulmonary embolus. 2. No acute cardiopulmonary disease. 3. No acute abdominal or pelvic pathology. 4.  Aortic Atherosclerosis (ICD10-I70.0). Electronically Signed   By: Elige Ko M.D.   On: 02/28/2023 08:12   CT ABDOMEN PELVIS W CONTRAST  Result Date: 02/28/2023 IMPRESSION: 1. No evidence of pulmonary embolus. 2. No acute cardiopulmonary disease. 3. No acute abdominal or pelvic pathology. 4.  Aortic Atherosclerosis (ICD10-I70.0). Electronically Signed   By: Elige Ko M.D.   On: 02/28/2023 08:12   CT HEAD WO CONTRAST ( )  Result Date: 02/28/2023 IMPRESSION: 1. No acute intracranial pathology. 2. No acute osseous injury of the cervical spine. 3. Subtle nondisplaced fracture along the right side of the bridge of the nose with overlying soft tissue laceration and soft tissue emphysema. Electronically Signed   By: Elige Ko M.D.   On: 02/28/2023 08:05   CT  Maxillofacial Wo Contrast  Result Date: 02/28/2023 IMPRESSION: 1. No acute intracranial pathology. 2. No acute osseous injury of the cervical spine. 3. Subtle nondisplaced fracture along the right side of the bridge of the nose with overlying soft tissue laceration and soft tissue emphysema. Electronically Signed   By: Elige Ko M.D.   On: 02/28/2023 08:05   CT Cervical Spine Wo Contrast  Result Date: 02/28/2023 IMPRESSION: 1. No acute intracranial pathology. 2. No acute osseous injury of the cervical spine. 3. Subtle nondisplaced fracture along the right side of the bridge of the nose with overlying soft tissue laceration and soft tissue emphysema. Electronically Signed   By: Elige Ko M.D.   On: 02/28/2023 08:05   DG Knee 2 Views Right  Result Date: 02/28/2023 IMPRESSION: 1. No acute finding. 2. 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 Electronically Signed   By: Tiburcio Pea M.D.   On: 02/28/2023 05:36   DG Knee 2 Views Left  Result Date: 02/28/2023 IMPRESSION: No acute finding. Electronically Signed   By: Tiburcio Pea M.D.   On: 02/28/2023 05:35    Cardiac Studies   2D echo 02/28/2023: 1. Left ventricular ejection fraction, by estimation, is 60 to 65%. The  left ventricle has normal function. The left ventricle has no regional  wall motion abnormalities. Left ventricular diastolic parameters are  consistent with Grade I diastolic  dysfunction (impaired relaxation).   2. Right ventricular systolic function is normal. The right ventricular  size is normal.   3. The mitral valve is normal in structure. No evidence of mitral valve  regurgitation.   4. The aortic valve is grossly normal. Aortic valve regurgitation is not  visualized.   5. The inferior vena cava is normal in size with greater than 50%  respiratory variability, suggesting right atrial pressure of 3 mmHg.   Patient Profile     57 y.o. female with history  of hypertension, hyperlipidemia, type 2 diabetes, hypothyroidism, osteoarthritis, gastroesophageal reflux disease, degenerative disc disease, anxiety, history of blood clots, who is being seen 03/01/2023 for the evaluation of syncope at the request of Dr. Alvester Morin.   Assessment & Plan    1. Syncope -Appears to be in the setting of abdominal discomfort, diarrhea middle of the night, concerning for vasovagal syncope, possibly exacerbated by weight loss and pharmacotherapy  -However, cannot exclude arrhythmia or pause at this time -Echo with preserved LVSF and normal wall motion -No evidence of sustained arrhythmia, prolonged pause, or high-grade AV block on telemetry  -Continue to monitor while admitted -Zio AT recommended at time  of discharge (timing unclear at this time) -No plan for ischemic workup at this time   2.  Minimally elevated troponin, nontrending -Likely supply/demand mismatch in the setting of syncope -Echo with preserved LVSF and normal wall motion  -No plan for ischemic workup at this time   3.  Essential hypertension -Significant weight loss over the past year, no change to lisinopril dosing as outpatient Reports blood pressure typically runs 116 systolic at home -Would likely resume lower dose ACEi when restarted  -Orthostatics negative this admission   4.  Trauma -Nondisplaced fracture of nose, sore ribs on right, bilateral knees sore -Secondary to syncope, fall x 2 -Scanning with CTs showing no acute fractures -Recommend icing   5.  Diabetes type 2 with hyperglycemia -Blood glucose elevated on arrival in the 200s -On sliding scale insulin -A1c 5.7  6. Atrial tachycardia: -Asymptomatic -Nonsustained -No indication for pharmacotherapy at this time  7. Normocytic anemia: -Possibly dilutional  -Monitor      For questions or updates, please contact [facility] Please consult [email or web address] for contact info under Cardiology/STEMI.    Signed, Eula Listen,  PA-C [facility] Pager: [phone] 03/01/2023, 12:06 PM

## 2023-03-01 NOTE — Progress Notes (Signed)
Occupational Therapy Evaluation Patient Details Name: Nicole Crane MRN: [id number] DOB: May 30, 1966 Today's Date: 03/01/2023   History of Present Illness Pt is a 57 y.o. female with PMH consisting of type 2 DM, HTN, obesity. Pt admitted to ED (6/19) with multiple syncopal episodes with reported fall and elevated troponin. Pt also mentioned needing to have a bowel movement with secondary syncopal event. MD assessment includes syncope, minimally elevated troponin likely secondary to supply/demand mismatch in the setting of syncope. CT scan 6/19 showed subtle nondisplaced fracture along the right side of the bridge of the nose with overlying soft tissue laceration and soft tissue emphysema.   Clinical Impression   Pt in room with spouse, pleasant and motivated to participate in OT eval. PTA, pt IND with ADL/IADL performance, recently retiring from the school systems. Pt educated on role and purpose of OT, she currently participates in outpatient PT for her knees. Pt primarily limited by pain and discomfort, appears to be at or very near functional baseline. Further acute OT needs not necessary, OT will sign off at this time and complete orders.      Recommendations for follow up therapy are one component of a multi-disciplinary discharge planning process, led by the attending physician.  Recommendations may be updated based on patient status, additional functional criteria and insurance authorization.   Assistance Recommended at Discharge PRN  Patient can return home with the following A little help with bathing/dressing/bathroom;Help with stairs or ramp for entrance;A little help with walking and/or transfers    Functional Status Assessment  Patient has not had a recent decline in their functional status  Equipment Recommendations  None recommended by OT       Precautions / Restrictions Precautions Precautions: None Restrictions Weight Bearing Restrictions: No      Mobility Bed  Mobility Overal bed mobility: Independent             General bed mobility comments: No assistance or cueing needed.    Transfers Overall transfer level: Modified independent Equipment used: Rolling walker (2 wheels)               General transfer comment:  (Stands from recliner IND, OT standby for physical assist)      Balance Overall balance assessment: Needs assistance   Sitting balance-Leahy Scale: Normal       Standing balance-Leahy Scale: Normal Standing balance comment: Pt tolerates static standing with no UE support or LOB, standby for safety d/t pain levels                           ADL either performed or assessed with clinical judgement   ADL Overall ADL's : Needs assistance/impaired                                       General ADL Comments: Pt primarily limited by pain for ADL performance. Pt has difficult time performing functional movements for UB/LB ADLs due to rib pain and discomfort. Cannot reach arms OH, and can only cross RLE over in figure four to don sock/shoe. Once pain and discomfort resolve, anticipate pt can perform ADLs at baseline.      Pertinent Vitals/Pain Pain Assessment Pain Assessment: 0-10 Pain Score: 7  Pain Location: Head/face, ribs, back Pain Intervention(s): Limited activity within patient's tolerance, Repositioned        Extremity/Trunk Assessment Upper Extremity Assessment  Upper Extremity Assessment: Overall WFL for tasks assessed (Movements limited by rib pain)   Lower Extremity Assessment Lower Extremity Assessment: Overall WFL for tasks assessed       Communication Communication Communication: No difficulties   Cognition Arousal/Alertness: Awake/alert Behavior During Therapy: WFL for tasks assessed/performed Overall Cognitive Status: Within Functional Limits for tasks assessed                                       General Comments  Pt limited by rib pain at  this time, reports ringing in ears and feeling of "fluid" in forehead            Home Living Family/patient expects to be discharged to:: Private residence Living Arrangements: Spouse/significant other;Children Available Help at Discharge: Family;Available PRN/intermittently Type of Home: House Home Access: Stairs to enter [employer] of Steps: 4 Entrance Stairs-Rails: Left Home Layout: One level     Bathroom Shower/Tub: Producer, television/film/video: Handicapped height Bathroom Accessibility: Yes   Home Equipment: Shower seat - built in;Toilet riser          Prior Functioning/Environment Prior Level of Function : Independent/Modified Independent             Mobility Comments: 1 fall last year, imbalance on stairs going outside ADLs Comments: IND with ADLs including driving, 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        OT Problem List: Pain;Decreased activity tolerance;Decreased strength       AM-PAC OT "6 Clicks" Daily Activity     Outcome Measure Help from another person eating meals?: None Help from another person taking care of personal grooming?: None Help from another person toileting, which includes using toliet, bedpan, or urinal?: A Little Help from another person bathing (including washing, rinsing, drying)?: A Little Help from another person to put on and taking off regular upper body clothing?: A Little Help from another person to put on and taking off regular lower body clothing?: A Little 6 Click Score: 20   End of Session Equipment Utilized During Treatment: Gait belt Nurse Communication: Mobility status;Other (comment) (Pain level, and pt's subjective report of ringing in ears)  Activity Tolerance: Patient limited by pain Patient left: with family/visitor present;in chair;with call bell/phone within reach  OT Visit Diagnosis: History of falling (Z91.81);Muscle weakness (generalized) (M62.81);Pain                Time:  9147-8295 OT Time Calculation (min): 24 min Charges:  OT General Charges $OT Visit: 1 Visit OT Evaluation $OT Eval Low Complexity: 1 Low  Nicole Milford L. Amoreena Crane, OTR/L  03/01/23, 12:07 PM

## 2023-03-01 NOTE — Evaluation (Addendum)
Physical Therapy Evaluation Patient Details Name: Nicole Crane MRN: [id number] DOB: [birth year] Today's Date: 03/01/2023  History of Present Illness  Pt is a 57 y.o. female with PMH consisting of type 2 DM, HTN, obesity. Pt admitted to ED (6/19) with multiple syncopal episodes with reported fall and elevated troponin. Pt also mentioned needing to have a bowel movement with secondary syncopal event. MD assessment includes syncope, minimally elevated troponin likely secondary to supply/demand mismatch in the setting of syncope.  Clinical Impression  Pt pleasant and motivated for therapy. Pt is independent with bed mobility/transfers. She was able to ambulate ~500 feet during the session; 1 x 50 feet with RW, the rest with no AD, just use of wall railing PRN. Pt demonstrated a slow, cautious cadence likely secondary to pain, but had no instances of LOB or buckling. She would have to stop walking every now and then for a few seconds due to a jolt of pain but was able to continue each time with no issues. Pt able to navigate 4 steps with no instances of instability. Vitals remained WNL throughout the session and she had no reports of dizziness/lightheadedness. Pt appears to be at or near functional baseline besides from being limited by pain, so further PT services are not necessary.     Recommendations for follow up therapy are one component of a multi-disciplinary discharge planning process, led by the attending physician.  Recommendations may be updated based on patient status, additional functional criteria and insurance authorization.  Follow Up Recommendations       Assistance Recommended at Discharge PRN  Patient can return home with the following  A little help with walking and/or transfers;Assistance with cooking/housework;Assist for transportation;Help with stairs or ramp for entrance    Equipment Recommendations None recommended by PT  Recommendations for Other Services        Functional Status Assessment Patient has not had a recent decline in their functional status.  Precautions / Restrictions Precautions Precautions: None Restrictions Weight Bearing Restrictions: No      Mobility  Bed Mobility Overal bed mobility: Independent             General bed mobility comments: No assistance or cueing needed.    Transfers Overall transfer level: Modified independent Equipment used: Rolling walker (2 wheels)               General transfer comment: Pt able to stand from EOB mod I, minimal use of RW.    Ambulation/Gait Ambulation/Gait assistance: Supervision Gait Distance (Feet): 1 x 50 feet RW, 1 x 200 ft no AD, 1 x 250 ft no AD Assistive device: Rolling walker (2 wheels), None Gait Pattern/deviations: WFL(Within Functional Limits) Gait velocity: decreased     General Gait Details: Symmetrical step length. Overall slow, guarded cadence but consistent. No instances of LOB, legs buckling.  Stairs Stairs: Yes Stairs assistance: Supervision Stair Management: One rail Left, Step to pattern, Forwards Number of Stairs: 4 General stair comments: Good eccentric/concentric control. Step-to pattern. No VC needed for sequencing.  Wheelchair Mobility    Modified Rankin (Stroke Patients Only)       Balance Overall balance assessment: Needs assistance   Sitting balance-Leahy Scale: Normal       Standing balance-Leahy Scale: Normal Standing balance comment: Pt able to tolerate static standing and walking with no UE support with no LOB  Pertinent Vitals/Pain Pain Assessment Pain Assessment: 0-10 Pain Score: 4  Pain Location: Head/face, ribs, back Pain Intervention(s): Monitored during session, Premedicated before session, Repositioned    Home Living Family/patient expects to be discharged to:: Private residence Living Arrangements: Spouse/significant other;Children Available Help at Discharge:  Family;Available PRN/intermittently Type of Home: House Home Access: Stairs to enter Entrance Stairs-Rails: Left Entrance Stairs-Number of Steps: 4   Home Layout: One level Home Equipment: Shower seat - built in;Toilet riser      Prior Function Prior Level of Function : Independent/Modified Independent                     Hand Dominance        Extremity/Trunk Assessment   Upper Extremity Assessment Upper Extremity Assessment: Overall WFL for tasks assessed    Lower Extremity Assessment Lower Extremity Assessment: Overall WFL for tasks assessed       Communication   Communication: No difficulties  Cognition Arousal/Alertness: Awake/alert Behavior During Therapy: WFL for tasks assessed/performed Overall Cognitive Status: Within Functional Limits for tasks assessed                                          General Comments      Exercises     Assessment/Plan    PT Assessment Patient does not need any further PT services  PT Problem List         PT Treatment Interventions      PT Goals (Current goals can be found in the Care Plan section)  Acute Rehab PT Goals Patient Stated Goal: Decrease pain, increase strength, improve walking PT Goal Formulation: With patient Time For Goal Achievement: 03/14/23 Potential to Achieve Goals: Good    Frequency       Co-evaluation               AM-PAC PT "6 Clicks" Mobility  Outcome Measure Help needed turning from your back to your side while in a flat bed without using bedrails?: None Help needed moving from lying on your back to sitting on the side of a flat bed without using bedrails?: None Help needed moving to and from a bed to a chair (including a wheelchair)?: None Help needed standing up from a chair using your arms (e.g., wheelchair or bedside chair)?: None Help needed to walk in hospital room?: None Help needed climbing 3-5 steps with a railing? : A Little 6 Click Score: 23     End of Session Equipment Utilized During Treatment: Gait belt Activity Tolerance: Patient tolerated treatment well Patient left: in chair;with family/visitor present;with chair alarm set;with call bell/phone within reach Nurse Communication: Mobility status PT Visit Diagnosis: Difficulty in walking, not elsewhere classified (R26.2);History of falling (Z91.81);Pain    Time: 0818-0900 PT Time Calculation (min) (ACUTE ONLY): 42 min   Charges:              Cena Benton, SPT 03/01/23, 10:39 AM This entire session was performed under direct supervision and direction of a licensed therapist/therapist assistant. I have personally read, edited and approve of the note as written.  Loran Senters, DPT

## 2023-03-01 NOTE — Progress Notes (Addendum)
  Progress Note   Patient: Nicole Crane ZOX:[id number] DOB: 05-07-1966 DOA: 02/28/2023     1 DOS: the patient was seen and examined on 03/01/2023    Subjective:  Patient seen and examined at bedside this morning in the presence of the husband and her son Additional history obtained from patient With a story that appears to be a vasovagal syncope Cardiologist planning on Zio patch administration at the time of discharge Denies nausea vomiting abdominal pain Did have some complaints of chest/rib pain and subsequently chest x-ray ordered. Have reviewed chest x-ray independently that did not show any fractures or any infiltrates   Brief hospital course: MARSELA COKLEY is a 57 y.o. female with medical history significant of obesity, postsurgical hypothyroidism, type 2 diabetes, hypertension, hyperlipidemia presenting with syncope and elevated troponin.  Presentation appears to be vasovagal in the setting of abdominal discomfort, diarrhea that have been in the middle of the night and after she has gotten something to drink.  She presented to the ER afebrile, hemodynamically stable.  Satting well on room air.  White count 17, hemoglobin 12.2, platelets 285, urinalysis stable, creatinine 0.94, troponin 12-30.  Imaging including CT head, maxillofacial CT, CT angio of the chest, CT of the abdomen and C-spine grossly stable apart from nondisplaced fracture of the right nasal bridge.   Assessment and Plan:  Syncope likely vasovagal Positive syncopal episode at home x 2 associated with bowel movements Also with concurrent mildly elevated troponin in the 30s Nonfocal neuroexam Differential diagnosis includes vasovagal versus orthostatic versus cardiac etiology Monitor orthostatic vitals I have reviewed the patient's echocardiogram report showing ejection fraction 60 to 65% Cardiologist on board and care discussed   Elevated troponin likely secondary to demand ischemia in the setting of  syncope Troponin 30s in the setting of syncopal event x 2 at home Continue telemetry monitoring Patient will have a Zio patch placed at the time of discharge Plan of care discussed with cardiologist We will continue to monitor for any new telemetry changes or chest pains   Nasal fracture Continue conservative management and pain meds as needed  Hypertension controlled Continue home antihypertensives   Hyperlipidemia Continue current statin therapy   Uncontrolled type 2 diabetes mellitus with hyperglycemia, without long-term current use of insulin (HCC) Continue current sliding scale insulin Monitor glucose level closely   Post-surgical hypothyroidism Continue levothyroxine and Cytomel       Advance Care Planning:   Code Status: Full Code    Consults: Cardiology    Family Communication: Husband at the bedside      Physical Exam: Patient seen and examined at bedside this morning Nasal bridge tenderness Heart sounds 1 and 2 present No abdominal tenderness Normal respiratory function Vitals:   02/28/23 1258 02/28/23 2330 03/01/23 0736 03/01/23 1202  BP: 127/63 128/62 (!) 145/73 139/68  Pulse: 92 73    Resp: 19 20 16 16   Temp: 98.2 F (36.8 C) 98.3 F (36.8 C) 98.6 F (37 C) 98.4 F (36.9 C)  TempSrc:   Oral Oral  SpO2: 100% 99% 99% 99%  Weight:      Height:        Data Reviewed: Have personally reviewed patient's laboratory results showing sodium 141 potassium 3.8 creatinine 0.8  Author: Loyce Dys, MD 03/01/2023 3:43 PM  For on call review [email or web address].

## 2023-03-02 ENCOUNTER — Inpatient Hospital Stay (HOSPITAL_COMMUNITY)
Admit: 2023-03-02 | Discharge: 2023-03-02 | Disposition: A | Discharge status: Home / Self Care | Payer: BC Managed Care – PPO | Attending: Physician Assistant | Admitting: Physician Assistant

## 2023-03-02 DIAGNOSIS — R55 Syncope and collapse: Secondary | ICD-10-CM | POA: Diagnosis not present

## 2023-03-02 LAB — CBC WITH DIFFERENTIAL/PLATELET
Abs Immature Granulocytes: 0.03 10*3/uL (ref 0.00–0.07)
Basophils Absolute: 0.1 10*3/uL (ref 0.0–0.1)
Basophils Relative: 1 %
Eosinophils Absolute: 0.3 10*3/uL (ref 0.0–0.5)
Eosinophils Relative: 4 %
HCT: 36 % (ref 36.0–46.0)
Hemoglobin: 11.8 g/dL — ABNORMAL LOW (ref 12.0–15.0)
Immature Granulocytes: 0 %
Lymphocytes Relative: 39 %
Lymphs Abs: 3.1 10*3/uL (ref 0.7–4.0)
MCH: 29.8 pg (ref 26.0–34.0)
MCHC: 32.8 g/dL (ref 30.0–36.0)
MCV: 90.9 fL (ref 80.0–100.0)
Monocytes Absolute: 0.4 10*3/uL (ref 0.1–1.0)
Monocytes Relative: 6 %
Neutro Abs: 3.9 10*3/uL (ref 1.7–7.7)
Neutrophils Relative %: 50 %
Platelets: 238 10*3/uL (ref 150–400)
RBC: 3.96 MIL/uL (ref 3.87–5.11)
RDW: 12.2 % (ref 11.5–15.5)
WBC: 7.8 10*3/uL (ref 4.0–10.5)
nRBC: 0 % (ref 0.0–0.2)

## 2023-03-02 LAB — GLUCOSE, CAPILLARY
Glucose-Capillary: 112 mg/dL — ABNORMAL HIGH (ref 70–99)
Glucose-Capillary: 118 mg/dL — ABNORMAL HIGH (ref 70–99)

## 2023-03-02 LAB — BASIC METABOLIC PANEL
Anion gap: 4 — ABNORMAL LOW (ref 5–15)
BUN: 11 mg/dL (ref 6–20)
CO2: 29 mmol/L (ref 22–32)
Calcium: 8.5 mg/dL — ABNORMAL LOW (ref 8.9–10.3)
Chloride: 106 mmol/L (ref 98–111)
Creatinine, Ser: 0.91 mg/dL (ref 0.44–1.00)
GFR, Estimated: >60 mL/min (ref >60)
Glucose, Bld: 127 mg/dL — ABNORMAL HIGH (ref 70–99)
Potassium: 3.8 mmol/L (ref 3.5–5.1)
Sodium: 139 mmol/L (ref 135–145)

## 2023-03-02 MED ORDER — TRAMADOL HCL 50 MG PO TABS: 50.0000 mg | ORAL_TABLET | Freq: Four times a day (QID) | ORAL | 0 refills | Status: AC | PRN

## 2023-03-02 NOTE — Discharge Summary (Signed)
Physician Discharge Summary   Patient: Nicole Crane MRN: [id number] DOB: 09/01/1966  Admit date:     02/28/2023  Discharge date: 03/02/23  Discharge Physician: Loyce Dys   PCP: Noberto Retort, MD    Discharge Diagnoses: Syncope likely vasovagal Elevated troponin likely secondary to demand ischemia in the setting of syncope Nasal fracture Hypertension controlled Hyperlipidemia Uncontrolled type 2 diabetes mellitus with hyperglycemia, without long-term current use of insulin [facility]) Post-surgical hypothyroidism       Hospital Course: Nicole Crane is a 57 y.o. female with medical history significant of obesity, postsurgical hypothyroidism, type 2 diabetes, hypertension, hyperlipidemia presenting with syncope and elevated troponin.  Presentation appears to be vasovagal in the setting of abdominal discomfort, diarrhea that have been in the middle of the night and after she has gotten something to drink.  She presented to the ER afebrile, hemodynamically stable.  Satting well on room air.  White count 17, hemoglobin 12.2, platelets 285, urinalysis stable, creatinine 0.94, troponin 12-30.  Imaging including CT head, maxillofacial CT, CT angio of the chest, CT of the abdomen and C-spine grossly stable apart from nondisplaced fracture of the right nasal bridge.  Patient was seen by cardiologist and echocardiogram reviewed to be normal.  Has been cleared for discharge after Zio patch placement.  Patient will follow-up with cardiologist as well as ENT   Consultants: Cardiology Procedures performed: None Disposition: Home Diet recommendation:  Discharge Diet Orders (From admission, onward)     Start     Ordered   03/02/23 0000  Diet - low sodium heart healthy        03/02/23 1058           Cardiac diet DISCHARGE MEDICATION: Allergies as of 03/02/2023       Reactions   Alcohol Hives, Shortness Of Breath, Swelling   Antihistamines, Diphenhydramine-type Hives   Avelox  [moxifloxacin Hcl In Nacl] Other (See Comments)   hallucinations.   Dilaudid [hydromorphone Hcl] Hives, Itching   Erythromycin Base Hives   Kiwi Extract Itching   Lips itch   Levofloxacin Other (See Comments)   Hallucinations.   Morphine And Codeine Hives, Itching   Naproxen Other (See Comments)   Severe acid reflux--possible ulcers   Other    Anything with alcohol that she has to injest.   Penicillins Swelling, Other (See Comments)   Welts Has patient had a PCN reaction causing immediate rash, facial/tongue/throat swelling, SOB or lightheadedness with hypotension: Yes--lips swelling Has patient had a PCN reaction causing severe rash involving mucus membranes or skin necrosis: No Has patient had a PCN reaction that required hospitalization: No Has patient had a PCN reaction occurring within the last 10 years: No If all of the above answers are "NO", then may proceed with Cephalosporin use.   Prednisone Other (See Comments)   Heart races with large doses/high dose.   Pregabalin Swelling, Other (See Comments)   Fluid retention caused weight gained 50 LBS of fluid in ~ 4 days   Statins Other (See Comments)   Muscle/joint aches & pains   Sulfa Antibiotics Nausea And Vomiting   Victoza [liraglutide] Other (See Comments)   Caused visual disturbances (visual impairments)   Xigduo Xr [dapagliflozin Pro-metformin Er]    Causes UTI's        Medication List     STOP taking these medications    Trulicity 4.5 MG/0.5ML Sopn Generic drug: Dulaglutide       TAKE these medications    albuterol 108 (  90 Base) MCG/ACT inhaler Commonly known as: VENTOLIN HFA Inhale 1-2 puffs into the lungs every 6 (six) hours as needed for wheezing or shortness of breath.   ALPRAZolam 0.5 MG tablet Commonly known as: XANAX Take 0.5 mg by mouth at bedtime.   atorvastatin 40 MG tablet Commonly known as: LIPITOR Take 40 mg by mouth every evening.   diclofenac Sodium 1 % Gel Commonly known as:  VOLTAREN Apply 2 g topically 4 (four) times daily.   FreeStyle Libre 3 Sensor Misc APPLY EVERY 14 DAYS AS DIRECTED BY YOUR DOCTOR   ibuprofen 800 MG tablet Commonly known as: ADVIL Take 800 mg by mouth at bedtime.   Insulin Pen Needle 31G X 5 MM Misc Use to inject insulin 4 times daily.   levothyroxine 75 MCG tablet Commonly known as: SYNTHROID Take 1 tablet (75 mcg total) by mouth daily.   lidocaine 5 % Commonly known as: LIDODERM Place 1 patch onto the skin daily.   liothyronine 5 MCG tablet Commonly known as: CYTOMEL TAKE 1 TABLET BY MOUTH DAILY   lisinopril 20 MG tablet Commonly known as: ZESTRIL Take 20 mg by mouth daily.   metFORMIN 500 MG 24 hr tablet Commonly known as: GLUCOPHAGE-XR TAKE FOUR TABLETS BY MOUTH DAILY; **MUST CALL MD FOR APPOINTMENT FOR FUTURE REFILLS   Mounjaro 5 MG/0.5ML Pen Generic drug: tirzepatide Inject 5 mg into the skin once a week.   OneTouch Delica Lancets 30G Misc 1 each by Does not apply route 3 (three) times daily. Use Onetouch Delica Lancets to check blood sugar three times daily.   OneTouch Verio Flex System w/Device Kit Use onetouch verio flex to check blood sugar three times daily.   OneTouch Verio test strip Generic drug: glucose blood USE TO CHECK BLOOD SUGAR 3 TIMES DAILY   pantoprazole 40 MG tablet Commonly known as: PROTONIX Take 40 mg by mouth every evening.   traMADol 50 MG tablet Commonly known as: ULTRAM Take 1 tablet (50 mg total) by mouth every 6 (six) hours as needed for moderate pain or severe pain.        Follow-up Information     Gollan, Tollie Pizza, MD. Schedule an appointment as soon as possible for a visit.   Specialty: Cardiology Contact information: [address] [place] [address] [place] [address] [phone]         Linus Salmons, MD. Schedule an appointment as soon as possible for a visit.   Specialty: Otolaryngology Contact information: [address] [place] [address] [phone]                Discharge Exam: Ceasar Mons Weights   02/28/23 1003 03/02/23 0500  Weight: 79.4 kg 81.4 kg   Patient seen and examined at bedside this morning Nasal bridge tenderness Heart sounds 1 and 2 present No abdominal tenderness Normal respiratory function  Condition at discharge: good Discharge time spent: 36 minutes  Signed: Loyce Dys, MD [facility] 03/02/2023

## 2023-03-16 ENCOUNTER — Other Ambulatory Visit (HOSPITAL_COMMUNITY): Payer: Self-pay

## 2023-03-21 NOTE — Addendum Note (Signed)
Encounter addended by: Oneida Arenas on: 03/21/2023 3:33 PM  Actions taken: Imaging Exam ended

## 2023-04-26 NOTE — Progress Notes (Signed)
Cardiology Office Note    Date:  04/30/2023   ID:  Nicole Crane, DOB [birth year], MRN [id number]  PCP:  Noberto Retort, MD  Cardiologist:  Julien Nordmann, MD  Electrophysiologist:  None   Chief Complaint: Hospital follow up  History of Present Illness:   Nicole Crane is a 57 y.o. female with history of coronary artery calcification noted on CT imaging, aortic atherosclerosis, syncope felt to be vasovagal, nonsustained PSVT, HTN, HLD, DM2, postoperative abdominal blood clots following SBO, osteoarthritis, degenerative disc disease, GERD, and anxiety who presents for hospital follow-up as outlined below.  She was admitted to the hospital on 02/28/2023 through 03/02/2023 with 2 separate syncopal episodes.  Her initial episode occurred after she woke around 2 AM to take her puppy out.  Following this she was sitting on the side of the bed, developed tunnel vision, abdominal discomfort, and got up to use the restroom with subsequent syncopal episode falling over an oversized recliner in the living room.  Upon coming to, she continued to have severe abdominal pain and went to the restroom where she fell to her knees and fell on the floor and was unresponsive.  Patient's husband reported she was out for approximately 1 minute to 1-1/2 minutes.  Upon coming to, she continued to have an urge to go to the restroom and ended up having diarrhea.  High-sensitivity troponin trended to 30.  EKG showed sinus rhythm without significant ST-T changes.  Echo showed an EF of 60 to 65%, no regional wall motion abnormalities, grade 1 diastolic dysfunction, normal RV systolic function and ventricular cavity size, no significant valvular abnormalities, and an estimated right atrial pressure of 3 mmHg.  CT of the head, maxillofacial, and cervical spine showed no acute intracranial pathology, no osseous injury of the cervical spine, with a noted subtle nondisplaced fracture along the right side of the bridge of  the nose.  CTA chest showed no evidence of PE with mild coronary calcification and mild thoracic aortic atherosclerosis.  No acute abdominal or pelvic pathology on CT imaging.  Syncope was concerning for vasovagal response in the setting of abdominal discomfort and diarrhea.  Outpatient cardiac monitoring showed a predominant rhythm of sinus with an average rate of 76 bpm (range 48 to 182 bpm), 3 episodes of SVT with the fastest and longest interval lasting 7 beats with a maximum rate of 182 bpm, rare atrial and ventricular ectopy, and no significant sustained arrhythmias, prolonged pauses, or evidence of high-grade AV block.  Patient triggered events were associated with sinus rhythm.  She comes in today and is without symptoms of angina or cardiac decompensation.  She reports 1 further syncopal episodes since hospital discharge that occurred after she discontinued cardiac monitor.  She decayed she had gotten up in the middle the night to urinate and upon completion and standing up, she suffered another syncopal episode.  No associated chest pain, palpitations, diaphoresis, nausea, or vomiting with this episode.  She reports her blood pressure obtained approximately 10 minutes after this was in the 90s over 40s.  She reports a T11-T12 compression fracture following this episode.  She has remained off lisinopril since hospital discharge.  She reports a history of whitecoat hypertension.  Labs independently reviewed: 02/2023 - potassium 3.8, BUN 11, serum creatinine 0.91, Hgb 11.8, PLT 238, albumin 3.5, AST/ALT normal, TC 142, TG 73, HDL 57, LDL 70, A1c 5.7, TSH 11.08  Past Medical History:  Diagnosis Date   Allergic rhinitis  Allergy    Anxiety    Bilateral leg edema    DDD (degenerative disc disease), lumbosacral    Diabetes mellitus without complication (HCC)    GERD (gastroesophageal reflux disease)    High cholesterol    Hx of blood clots    post op in abdomen after BSO   Hypertension     Hyperthyroidism    Multiple thyroid nodules    OA (osteoarthritis) of shoulder    Spinal stenosis    Tick bite 02/14/2018   [place] tick    Past Surgical History:  Procedure Laterality Date   ABDOMINAL HYSTERECTOMY     complete   BACK SURGERY     BONE MARROW BIOPSY     after BSO to check clotting   CHOLECYSTECTOMY     DILATION AND CURETTAGE OF UTERUS     ENDOMETRIAL ABLATION     EXPLORATORY LAPAROTOMY     BSO   MOUTH SURGERY     ROTATOR CUFF REPAIR     THYROIDECTOMY N/A 02/26/2018   Procedure: TOTAL THYROIDECTOMY;  Surgeon: Darnell Level, MD;  Location: [facility];  Service: General;  Laterality: N/A;    Current Medications: Current Meds  Medication Sig   albuterol (PROVENTIL HFA;VENTOLIN HFA) 108 (90 Base) MCG/ACT inhaler Inhale 1-2 puffs into the lungs every 6 (six) hours as needed for wheezing or shortness of breath.   ALPRAZolam (XANAX) 0.5 MG tablet Take 0.5 mg by mouth at bedtime.   atorvastatin (LIPITOR) 40 MG tablet Take 40 mg by mouth every evening.   Continuous Glucose Sensor (FREESTYLE LIBRE 3 SENSOR) MISC APPLY EVERY 14 DAYS AS DIRECTED BY YOUR DOCTOR   diclofenac Sodium (VOLTAREN) 1 % GEL Apply 2 g topically 4 (four) times daily.   ibuprofen (ADVIL,MOTRIN) 800 MG tablet Take 800 mg by mouth at bedtime.   levothyroxine (SYNTHROID) 75 MCG tablet Take 1 tablet (75 mcg total) by mouth daily.   lidocaine (LIDODERM) 5 % Place 1 patch onto the skin daily.   liothyronine (CYTOMEL) 5 MCG tablet TAKE 1 TABLET BY MOUTH DAILY   metFORMIN (GLUCOPHAGE-XR) 500 MG 24 hr tablet TAKE FOUR TABLETS BY MOUTH DAILY; **MUST CALL MD FOR APPOINTMENT FOR FUTURE REFILLS (Patient taking differently: Take 500 mg by mouth daily.)   metoprolol tartrate (LOPRESSOR) 50 MG tablet Take one tablet 2 hours before test   MOUNJARO 5 MG/0.5ML Pen Inject 5 mg into the skin once a week.   pantoprazole (PROTONIX) 40 MG tablet Take 40 mg by mouth every evening.   traMADol (ULTRAM) 50 MG tablet Take 1 tablet  (50 mg total) by mouth every 6 (six) hours as needed for moderate pain or severe pain.    Allergies:   Alcohol; Antihistamines, diphenhydramine-type; Avelox [moxifloxacin hcl in nacl]; Dilaudid [hydromorphone hcl]; Erythromycin base; Kiwi extract; Levofloxacin; Morphine and codeine; Naproxen; Other; Penicillins; Prednisone; Pregabalin; Statins; Sulfa antibiotics; Victoza [liraglutide]; and Xigduo xr [dapagliflozin pro-metformin er]   Social History   Socioeconomic History   Marital status: Married    Spouse name: Not on file   Number of children: Not on file   Years of education: Not on file   Highest education level: Not on file  Occupational History   Not on file  Tobacco Use   Smoking status: Never   Smokeless tobacco: Never  Vaping Use   Vaping status: Never Used  Substance and Sexual Activity   Alcohol use: No   Drug use: No   Sexual activity: Yes  Other Topics Concern  Not on file  Social History Narrative   Not on file   Social Determinants of Health   Financial Resource Strain: Not on file  Food Insecurity: No Food Insecurity (02/28/2023)   Hunger Vital Sign    Worried About Running Out of Food in the Last Year: Never true    Ran Out of Food in the Last Year: Never true  Transportation Needs: No Transportation Needs (02/28/2023)   PRAPARE - Administrator, Civil Service (Medical): No    Lack of Transportation (Non-Medical): No  Physical Activity: Not on file  Stress: Not on file  Social Connections: Not on file     Family History:  The patient's family history includes Breast cancer in her maternal grandmother and mother; Diabetes in her brother. There is no history of Thyroid disease, Colon cancer, Colon polyps, Esophageal cancer, Rectal cancer, or Stomach cancer.  ROS:   12-point review of systems is negative unless otherwise noted in the HPI.   EKGs/Labs/Other Studies Reviewed:    Studies reviewed were summarized above. The additional studies  were reviewed today:  Zio patch 02/2023: Normal sinus rhythm Patient had a min HR of 48 bpm, max HR of 182 bpm, and avg HR of 76 bpm.   3 Supraventricular Tachycardia runs occurred, the run with the fastest interval lasting 7 beats with a max rate of 182 bpm, the longest lasting 5 beats with an avg rate of 114 bpm.    Isolated SVEs were rare (<1.0%), SVE Couplets were rare (<1.0%), and SVE Triplets were rare (<1.0%). Isolated VEs were rare (<1.0%), and no VE Couplets or VE Triplets  were present.   Patient triggered events (3) associated with normal sinus rhythm __________  2D echo 02/28/2023: 1. Left ventricular ejection fraction, by estimation, is 60 to 65%. The  left ventricle has normal function. The left ventricle has no regional  wall motion abnormalities. Left ventricular diastolic parameters are  consistent with Grade I diastolic  dysfunction (impaired relaxation).   2. Right ventricular systolic function is normal. The right ventricular  size is normal.   3. The mitral valve is normal in structure. No evidence of mitral valve  regurgitation.   4. The aortic valve is grossly normal. Aortic valve regurgitation is not  visualized.   5. The inferior vena cava is normal in size with greater than 50%  respiratory variability, suggesting right atrial pressure of 3 mmHg.    EKG:  EKG is ordered today.  The EKG ordered today demonstrates NSR, 79 bpm, low voltage QRS, possible prior septal infarct versus lead placement, no acute ST-T changes  Recent Labs: 02/28/2023: TSH 11.080 03/01/2023: ALT 18 03/02/2023: BUN 11; Creatinine, Ser 0.91; Hemoglobin 11.8; Platelets 238; Potassium 3.8; Sodium 139  Recent Lipid Panel    Component Value Date/Time   CHOL 142 02/28/2023 1007   TRIG 73 02/28/2023 1007   HDL 57 02/28/2023 1007   CHOLHDL 2.5 02/28/2023 1007   VLDL 15 02/28/2023 1007   LDLCALC 70 02/28/2023 1007   LDLDIRECT 75.0 01/18/2021 0836    PHYSICAL EXAM:    VS:  BP (!)  140/80 (BP Location: Left Arm, Patient Position: Sitting, Cuff Size: Normal)   Pulse 79   Ht 5' 4.5" (1.638 m)   Wt 177 lb 9.6 oz (80.6 kg)   SpO2 99%   BMI 30.01 kg/m   BMI: Body mass index is 30.01 kg/m.  Physical Exam Vitals reviewed.  Constitutional:      Appearance: She  is well-developed.  HENT:     Head: Normocephalic and atraumatic.  Eyes:     General:        Right eye: No discharge.        Left eye: No discharge.  Neck:     Vascular: No JVD.  Cardiovascular:     Rate and Rhythm: Normal rate and regular rhythm.     Heart sounds: Normal heart sounds, S1 normal and S2 normal. Heart sounds not distant. No midsystolic click and no opening snap. No murmur heard.    No friction rub.  Pulmonary:     Effort: Pulmonary effort is normal. No respiratory distress.     Breath sounds: Normal breath sounds. No decreased breath sounds, wheezing or rales.  Chest:     Chest wall: No tenderness.  Abdominal:     General: There is no distension.  Musculoskeletal:     Cervical back: Normal range of motion.     Right lower leg: No edema.     Left lower leg: No edema.  Skin:    General: Skin is warm and dry.     Nails: There is no clubbing.  Neurological:     Mental Status: She is alert and oriented to person, place, and time.  Psychiatric:        Speech: Speech normal.        Behavior: Behavior normal.        Thought Content: Thought content normal.        Judgment: Judgment normal.     Wt Readings from Last 3 Encounters:  04/30/23 177 lb 9.6 oz (80.6 kg)  03/02/23 179 lb 7.3 oz (81.4 kg)  06/08/22 180 lb 3.2 oz (81.7 kg)     ASSESSMENT & PLAN:   History of syncope: Presumed to be vasovagal during recent admission in the setting of abdominal pain and diarrhea.  Since hospital discharge, she had 1 further syncopal episode associate with post micturition.  No evidence of significant structural cardiac abnormality on echo or significant sustained arrhythmia, high-grade AV block,  prolonged pause on cardiac monitoring.  Schedule carotid artery ultrasound.  Pursue ischemic evaluation as outlined below.  Allow for slightly permissive hypertension.  May need to consider ILR.  Elevated high-sensitivity troponin: Felt to be supply/demand ischemia secondary to vasovagal syncope.  Echo demonstrated preserved LV systolic function with normal wall motion.  No symptoms of angina associated with syncope.  Schedule coronary CTA to evaluate for ischemic heart disease contributing to syncopal episodes in the setting of elevated high-sensitivity troponin and coronary artery calcification.  Based on CT results, consider addition of aspirin (though patient with possible gastric ulcers).  Coronary artery calcification/aortic atherosclerosis/HLD: Coronary CTA plan as outlined above.  LDL 78 with normal AST/ALT in 02/2023.  Atorvastatin.  Nonsustained PSVT: Three brief episodes lasting up to 7 beats were noted on outpatient cardiac monitor without significant posttermination pause.  Unlikely to be contributing factor for patient's syncope.  Defer AV nodal blocking medication at this time.  HTN: Blood pressure is reasonably controlled in the office today.  She reports a history of whitecoat hypertension.  With presumed vasovagal/post micturition syncope, plan for slightly permissive blood pressure at this time   Disposition: F/u with Dr. Mariah Milling after testing.   Medication Adjustments/Labs and Tests Ordered: Current medicines are reviewed at length with the patient today.  Concerns regarding medicines are outlined above. Medication changes, Labs and Tests ordered today are summarized above and listed in the Patient Instructions accessible in  Encounters.   Signed, Nicole Listen, Nicole Crane 04/30/2023 12:35 PM     [facility] [place] [address] [place], [place] [address] [phone]

## 2023-04-30 ENCOUNTER — Ambulatory Visit: Payer: BC Managed Care – PPO | Attending: Physician Assistant | Admitting: Physician Assistant

## 2023-04-30 ENCOUNTER — Encounter: Payer: Self-pay | Admitting: Physician Assistant

## 2023-04-30 VITALS — BP 140/80 | HR 79 | Ht 64.5 in | Wt 177.6 lb

## 2023-04-30 DIAGNOSIS — I2510 Atherosclerotic heart disease of native coronary artery without angina pectoris: Secondary | ICD-10-CM | POA: Diagnosis not present

## 2023-04-30 DIAGNOSIS — R7989 Other specified abnormal findings of blood chemistry: Secondary | ICD-10-CM | POA: Diagnosis not present

## 2023-04-30 DIAGNOSIS — R55 Syncope and collapse: Secondary | ICD-10-CM

## 2023-04-30 DIAGNOSIS — I10 Essential (primary) hypertension: Secondary | ICD-10-CM

## 2023-04-30 DIAGNOSIS — I700 Atherosclerosis of aorta: Secondary | ICD-10-CM | POA: Diagnosis not present

## 2023-04-30 DIAGNOSIS — E785 Hyperlipidemia, unspecified: Secondary | ICD-10-CM

## 2023-04-30 DIAGNOSIS — I2584 Coronary atherosclerosis due to calcified coronary lesion: Secondary | ICD-10-CM

## 2023-04-30 DIAGNOSIS — I471 Supraventricular tachycardia, unspecified: Secondary | ICD-10-CM

## 2023-04-30 MED ORDER — METOPROLOL TARTRATE 50 MG PO TABS: ORAL_TABLET | ORAL | 0 refills | Status: DC

## 2023-04-30 NOTE — Patient Instructions (Signed)
Medication Instructions:  Your Physician recommend you continue on your current medication as directed.     *If you need a refill on your cardiac medications before your next appointment, please call your pharmacy*   Lab Work: None  If you have labs (blood work) drawn today and your tests are completely normal, you will receive your results only by: MyChart Message (if you have MyChart) OR A paper copy in the mail If you have any lab test that is abnormal or we need to change your treatment, we will call you to review the results.   Testing/Procedures:  Your physician has requested that you have a carotid duplex. This test is an ultrasound of the carotid arteries in your neck. It looks at blood flow through these arteries that supply the brain with blood.   Allow one hour for this exam.  There are no restrictions or special instructions.  This will take place at [address] [address], [place] [address]     Your cardiac CT will be scheduled at one of the below locations:   [facility] [address] [place], [place] [address] [phone]  OR   [facility] [address] [place], [place] [address] [phone]   If scheduled at [facility] or [facility], please arrive 15 mins early for check-in and test prep.  There is spacious parking and easy access to the radiology department from the [facility] entrance. Please enter here and check-in with the desk attendant.   Please follow these instructions carefully (unless otherwise directed):  An IV will be required for this test and Nitroglycerin will be given.   On the Night Before the Test: Be sure to Drink plenty of water. Do not consume any caffeinated/decaffeinated beverages or chocolate 12 hours prior to your test. Do not take any antihistamines 12 hours prior  to your test.  On the Day of the Test: Drink plenty of water until 1 hour prior to the test. Do not eat any food 1 hour prior to test. You may take your regular medications prior to the test.  Take metoprolol (Lopressor) two hours prior to test. If you take Furosemide/Hydrochlorothiazide/Spironolactone, please HOLD on the morning of the test. FEMALES- please wear underwire-free bra if available, avoid dresses & tight clothing  After the Test: Drink plenty of water. After receiving IV contrast, you may experience a mild flushed feeling. This is normal. On occasion, you may experience a mild rash up to 24 hours after the test. This is not dangerous. If this occurs, you can take Benadryl 25 mg and increase your fluid intake. If you experience trouble breathing, this can be serious. If it is severe call 911 IMMEDIATELY. If it is mild, please call our office. If you take any of these medications: Glipizide/Metformin, Avandament, Glucavance, please do not take 48 hours after completing test unless otherwise instructed.  We will call to schedule your test 2-4 weeks out understanding that some insurance companies will need an authorization prior to the service being performed.   For more information and frequently asked questions, please visit our website : [email or web address]  For non-scheduling related questions, please contact the cardiac imaging nurse navigator should you have any questions/concerns: Cardiac Imaging Nurse Navigators Direct Office Dial: [phone]   For scheduling needs, including cancellations and rescheduling, please call [place], [phone].    Follow-Up: At [facility], you and your  health needs are our priority.  As part of our continuing mission to provide you with exceptional heart care, we have created designated Provider Care Teams.  These Care Teams include your primary Cardiologist (physician) and Advanced Practice Providers (APPs -   Physician Assistants and Nurse Practitioners) who all work together to provide you with the care you need, when you need it.  We recommend signing up for the patient portal called "MyChart".  Sign up information is provided on this After Visit Summary.  MyChart is used to connect with patients for Virtual Visits (Telemedicine).  Patients are able to view lab/test results, encounter notes, upcoming appointments, etc.  Non-urgent messages can be sent to your provider as well.   To learn more about what you can do with MyChart, go to [email or web address].    Your next appointment:   6 month(s)  Provider:   You may see Julien Nordmann, MD or one of the following Advanced Practice Providers on your designated Care Team:

## 2023-05-13 ENCOUNTER — Other Ambulatory Visit: Payer: Self-pay | Admitting: Endocrinology

## 2023-05-13 DIAGNOSIS — E890 Postprocedural hypothyroidism: Secondary | ICD-10-CM

## 2023-05-13 DIAGNOSIS — E1169 Type 2 diabetes mellitus with other specified complication: Secondary | ICD-10-CM

## 2023-05-18 ENCOUNTER — Other Ambulatory Visit: Payer: Self-pay | Admitting: Unknown Physician Specialty

## 2023-05-21 ENCOUNTER — Telehealth: Payer: Self-pay | Admitting: Cardiovascular Disease

## 2023-05-21 NOTE — Telephone Encounter (Signed)
Dayna , patient's chart was reviewed for preoperative cardiac evaluation.  He/she was seen by you on 04/30/2023 and according to protocol, we request that you comment on cardiac risk for upcoming procedure since office visit was less than 2 months ago. You ordered a cardiac CTA, scheduled for 05/24/2023. Do you want to wait to weigh in until after this is read?    Please route your response to p cv div preop.  Thank you, Samara Deist

## 2023-05-21 NOTE — Telephone Encounter (Signed)
   Pre-operative Risk Assessment    Patient Name: TYNECIA CORTIJO  DOB: 1966/08/11 MRN: [id number]      Request for Surgical Clearance    Procedure:   Septoplasty, bilateral inferior turbinate reduction  Date of Surgery:  Clearance 06/15/23                                 Surgeon:  Dr Lonny Prude Group or Practice Name:  [facility] Phone number:  [phone] Fax number:  [phone]   Type of Clearance Requested:   - Medical    Type of Anesthesia:  Not Indicated   Additional requests/questions:    Courtney Heys   05/21/2023, 4:21 PM

## 2023-05-21 NOTE — Telephone Encounter (Signed)
I think this might have been meant for Eula Listen since I have not evaluated patient before. Will forward, thanks!

## 2023-05-23 ENCOUNTER — Telehealth (HOSPITAL_COMMUNITY): Payer: Self-pay | Admitting: Emergency Medicine

## 2023-05-23 NOTE — Telephone Encounter (Signed)
Reaching out to patient to offer assistance regarding upcoming cardiac imaging study; pt verbalizes understanding of appt date/time, parking situation and where to check in, pre-test NPO status and medications ordered, and verified current allergies; name and call back number provided for further questions should they arise Sara Wallace RN Navigator Cardiac Imaging [facility] [phone] office [phone] cell 

## 2023-05-24 ENCOUNTER — Ambulatory Visit: Payer: BC Managed Care – PPO | Attending: Physician Assistant

## 2023-05-24 ENCOUNTER — Ambulatory Visit
Admission: RE | Admit: 2023-05-24 | Discharge: 2023-05-24 | Disposition: A | Discharge status: Home / Self Care | Payer: BC Managed Care – PPO | Source: Ambulatory Visit | Attending: Physician Assistant | Admitting: Physician Assistant

## 2023-05-24 DIAGNOSIS — R7989 Other specified abnormal findings of blood chemistry: Secondary | ICD-10-CM | POA: Diagnosis present

## 2023-05-24 DIAGNOSIS — I2510 Atherosclerotic heart disease of native coronary artery without angina pectoris: Secondary | ICD-10-CM | POA: Insufficient documentation

## 2023-05-24 DIAGNOSIS — I2584 Coronary atherosclerosis due to calcified coronary lesion: Secondary | ICD-10-CM | POA: Insufficient documentation

## 2023-05-24 DIAGNOSIS — R55 Syncope and collapse: Secondary | ICD-10-CM | POA: Insufficient documentation

## 2023-05-24 LAB — POCT I-STAT CREATININE: Creatinine, Ser: 0.9 mg/dL (ref 0.44–1.00)

## 2023-05-24 MED ORDER — SODIUM CHLORIDE 0.9 % IV BOLUS
150.0000 mL | Freq: Once | INTRAVENOUS | Status: AC
  Administered 2023-05-24: 150 mL via INTRAVENOUS

## 2023-05-24 MED ORDER — IOHEXOL 350 MG/ML SOLN
100.0000 mL | Freq: Once | INTRAVENOUS | Status: AC | PRN
  Administered 2023-05-24: 100 mL via INTRAVENOUS

## 2023-05-24 MED ORDER — METOPROLOL TARTRATE 5 MG/5ML IV SOLN
10.0000 mg | Freq: Once | INTRAVENOUS | Status: AC | PRN
  Administered 2023-05-24: 10 mg via INTRAVENOUS

## 2023-05-24 MED ORDER — NITROGLYCERIN 0.4 MG SL SUBL
0.8000 mg | SUBLINGUAL_TABLET | Freq: Once | SUBLINGUAL | Status: AC
  Administered 2023-05-24: 0.8 mg via SUBLINGUAL

## 2023-05-24 NOTE — Telephone Encounter (Signed)
Returned call to [facility].  She has been made aware pt is having a cardiac ct, once that is done, resulted, and if cleared, the provide will let them know.  She thanked me for the call back.

## 2023-05-24 NOTE — Progress Notes (Signed)
Patient tolerated procedure well. Ambulate w/o difficulty. Denies light headedness or being dizzy. Drinking fluids provided. Encouraged to drink extra water today and reasoning explained. Verbalized understanding. All questions answered. ABC intact. No further needs. Discharge from procedure area w/o issues.

## 2023-05-24 NOTE — Telephone Encounter (Signed)
[facility] is calling to get update on clearance.

## 2023-05-25 ENCOUNTER — Other Ambulatory Visit: Payer: Self-pay

## 2023-05-25 MED ORDER — ASPIRIN 81 MG PO TBEC: 81.0000 mg | DELAYED_RELEASE_TABLET | Freq: Every day | ORAL | Status: DC

## 2023-05-25 NOTE — Telephone Encounter (Signed)
Patient Name: Nicole Crane  DOB: [birth year] MRN: [id number]  Primary Cardiologist: Julien Nordmann, MD  Chart reviewed as part of pre-operative protocol coverage. Given past medical history and time since last visit, based on ACC/AHA guidelines, Nicole Crane is at acceptable risk for the planned procedure without further cardiovascular testing. Recenet coronary CTA showed nonobstructive CAD with recommendation for ongoing medical management.   The patient was advised that if she develops new symptoms prior to surgery to contact our office to arrange for a follow-up visit, and she verbalized understanding.  I will route this recommendation to the requesting party via Epic fax function and remove from pre-op pool.  Please call with questions.  Joylene Grapes, NP 05/25/2023, 1:25 PM

## 2023-05-25 NOTE — Telephone Encounter (Signed)
Coronary CTA showed nonobstructive CAD with recommendation for ongoing medical management.  If patient has not had any new symptoms of angina or cardiac decompensation they may proceed with noncardiac procedure without further testing at a class I risk with an estimated rate of 3.9% for adverse cardiac effect in the perioperative timeframe.

## 2023-07-18 ENCOUNTER — Encounter: Payer: Self-pay | Admitting: Unknown Physician Specialty

## 2023-07-18 NOTE — Anesthesia Preprocedure Evaluation (Addendum)
Anesthesia Evaluation  Patient identified by MRN, date of birth, ID band Patient awake    Reviewed: Allergy & Precautions, H&P , NPO status , Patient's Chart, lab work & pertinent test results  Airway Mallampati: I  TM Distance: <3 FB Neck ROM: Full    Dental no notable dental hx.  Upper permanent bridge:   Pulmonary neg pulmonary ROS   Pulmonary exam normal breath sounds clear to auscultation       Cardiovascular hypertension, Normal cardiovascular exam Rhythm:Regular Rate:Normal  FINDINGS:  Aorta: Normal size.  No calcifications.  No dissection.   Aortic Valve:  Trileaflet.  No calcifications.   Coronary Arteries:  Normal coronary origin.  Right dominance.   RCA is a dominant artery. There is calcified plaque proximally  causing mild stenosis (25-49%).   Left main gives rise to LAD and LCX arteries. LM has no disease.   LAD has calcified plaque proximally causing mild stenosis (25%).   LCX is a non-dominant artery.  There is no plaque.   Other findings:   Normal pulmonary vein drainage into the left atrium.   Normal left atrial appendage without a thrombus.   Normal size of the pulmonary artery.   IMPRESSION:  1. Coronary calcium score of 184. This was 96th percentile for age  and sex matched control.   2. Normal coronary origin with right dominance.   3. Mild proximal LAD and RCA stenosis (25-49%).   4. CAD-RADS 2. Mild non-obstructive CAD (25-49%). Consider  preventive therapy and risk factor modification.   Electronically Signed:  By: Debbe Odea M.D.  On: 05/24/2023 15:11    02-28-23    1. Left ventricular ejection fraction, by estimation, is 60 to 65%. The  left ventricle has normal function. The left ventricle has no regional  wall motion abnormalities. Left ventricular diastolic parameters are  consistent with Grade I diastolic  dysfunction (impaired relaxation).   2. Right ventricular  systolic function is normal. The right ventricular  size is normal.   3. The mitral valve is normal in structure. No evidence of mitral valve  regurgitation.   4. The aortic valve is grossly normal. Aortic valve regurgitation is not  visualized.   5. The inferior vena cava is normal in size with greater than 50%  respiratory variability, suggesting right atrial pressure of 3 mmHg.       Neuro/Psych   Anxiety     negative neurological ROS  negative psych ROS   GI/Hepatic Neg liver ROS,GERD  ,,  Endo/Other  diabetesHypothyroidism Hyperthyroidism   Renal/GU negative Renal ROS  negative genitourinary   Musculoskeletal  (+) Arthritis ,    Abdominal   Peds negative pediatric ROS (+)  Hematology negative hematology ROS (+)   Anesthesia Other Findings Patient requests lidocaine local prior to IV start. That is fine.   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), lumbosacral Hyperthyroidism Multiple thyroid nodules  Spinal stenosis Anxiety  GERD (gastroesophageal reflux disease) Allergic rhinitis  Bilateral leg edema Hx of blood clots  Tick bite Allergy Grade  I diastolic dysfunction Post-surgical hypothyroidism  Vasovagal syncope    Reproductive/Obstetrics negative OB ROS                             Anesthesia Physical Anesthesia Plan  ASA: 3  Anesthesia Plan: General ETT   Post-op Pain Management:  Induction: Intravenous  PONV Risk Score and Plan:   Airway Management Planned: Oral ETT  Additional Equipment:   Intra-op Plan:   Post-operative Plan: Extubation in OR  Informed Consent: I have reviewed the patients History and Physical, chart, labs and discussed the procedure including the risks, benefits and alternatives for the proposed anesthesia with the patient or authorized representative  who has indicated his/her understanding and acceptance.     Dental Advisory Given  Plan Discussed with: Anesthesiologist, CRNA and Surgeon  Anesthesia Plan Comments: (Patient consented for risks of anesthesia including but not limited to:  - adverse reactions to medications - damage to eyes, teeth, lips or other oral mucosa - nerve damage due to positioning  - sore throat or hoarseness - Damage to heart, brain, nerves, lungs, other parts of body or loss of life  Patient voiced understanding and assent.)       Anesthesia Quick Evaluation

## 2023-08-24 ENCOUNTER — Encounter: Payer: Self-pay | Admitting: Unknown Physician Specialty

## 2023-08-24 ENCOUNTER — Other Ambulatory Visit: Payer: Self-pay

## 2023-08-27 NOTE — Discharge Instructions (Signed)
[facility] ENDOSCOPIC SINUS SURGERY [facility]  What is Functional Endoscopic Sinus Surgery?  The Surgery involves making the natural openings of the sinuses larger by removing the bony partitions that separate the sinuses from the nasal cavity.  The natural sinus lining is preserved as much as possible to allow the sinuses to resume normal function after the surgery.  In some patients nasal polyps (excessively swollen lining of the sinuses) may be removed to relieve obstruction of the sinus openings.  The surgery is performed through the nose using lighted scopes, which eliminates the need for incisions on the face.  A septoplasty is a different procedure which is sometimes performed with sinus surgery.  It involves straightening the boy partition that separates the two sides of your nose.  A crooked or deviated septum may need repair if is obstructing the sinuses or nasal airflow.  Turbinate reduction is also often performed during sinus surgery.  The turbinates are bony proturberances from the side walls of the nose which swell and can obstruct the nose in patients with sinus and allergy problems.  Their size can be surgically reduced to help relieve nasal obstruction.  What Can Sinus Surgery Do For Me?  Sinus surgery can reduce the frequency of sinus infections requiring antibiotic treatment.  This can provide improvement in nasal congestion, post-nasal drainage, facial pressure and nasal obstruction.  Surgery will NOT prevent you from ever having an infection again, so it usually only for patients who get infections 4 or more times yearly requiring antibiotics, or for infections that do not clear with antibiotics.  It will not cure nasal allergies, so patients with allergies may still require medication to treat their allergies after surgery. Surgery may improve headaches related to sinusitis, however, some people will continue to  require medication to control sinus headaches related to allergies.  Surgery will do nothing for other forms of headache (migraine, tension or cluster).  What Are the Risks of Endoscopic Sinus Surgery?  Current techniques allow surgery to be performed safely with little risk, however, there are rare complications that patients should be aware of.  Because the sinuses are located around the eyes, there is risk of eye injury, including blindness, though again, this would be quite rare. This is usually a result of bleeding behind the eye during surgery, which can effect vision, though there are treatments to protect the vision and prevent permanent injury. More serious complications would include bleeding inside the brain cavity or damage to the brain.This happens when the fluid around the brain leaks out into the sinus cavity.  Again, all of these complications are uncommon, and spinal fluid leaks can be safely managed surgically if they occur.  The most common complication of sinus surgery is bleeding from the nose, which may require packing or cauterization of the nose.  Patients with polyps may experience recurrence of the polyps that would require revision surgery.  Alterations of sense of smell or injury to the tear ducts are also rare complications.   What is the Surgery Like, and what is the Recovery?  The Surgery usually takes a couple of hours to perform, and is usually performed under a general anesthetic (completely asleep).  Patients are usually discharged home after a couple of hours.  Sometimes during surgery it is necessary to pack the nose to control bleeding, and the packing is left in place for 24 - 48 hours, and removed by your surgeon.  If   a septoplasty was performed during the procedure, there is often a splint placed which must be removed after 5-7 days.   Discomfort: Pain is usually mild to moderate, and can be controlled by prescription pain medication or acetaminophen (Tylenol).   Aspirin, Ibuprofen (Advil, Motrin), or Naprosyn (Aleve) should be avoided, as they can cause increased bleeding.  Most patients feel sinus pressure like they have a bad head cold for several days.  Sleeping with your head elevated can help reduce swelling and facial pressure, as can ice packs over the face.  A humidifier may be helpful to keep the mucous and blood from drying in the nose.   Diet: There are no specific diet restrictions, however, you should generally start with clear liquids and a light diet of bland foods because the anesthetic can cause some nausea.  Advance your diet depending on how your stomach feels.  Taking your pain medication with food will often help reduce stomach upset which pain medications can cause.  Nasal Saline Irrigation: It is important to remove blood clots and dried mucous from the nose as it is healing.  This is done by having you irrigate the nose at least 3 - 4 times daily with a salt water solution.  We recommend using NeilMed Sinus Rinse (available at the drug store).  Fill the squeeze bottle with the solution, bend over a sink, and insert the tip of the squeeze bottle into the nose  of an inch.  Point the tip of the squeeze bottle towards the inside corner of the eye on the same side your irrigating.  Squeeze the bottle and gently irrigate the nose.  If you bend forward as you do this, most of the fluid will flow back out of the nose, instead of down your throat.   The solution should be warm, near body temperature, when you irrigate.   Each time you irrigate, you should use a full squeeze bottle.   Note that if you are instructed to use Nasal Steroid Sprays at any time after your surgery, irrigate with saline BEFORE using the steroid spray, so you do not wash it all out of the nose. Another product, Nasal Saline Gel (such as AYR Nasal Saline Gel) can be applied in each nostril 3 - 4 times daily to moisture the nose and reduce scabbing or crusting.  Bleeding:   Bloody drainage from the nose can be expected for several days, and patients are instructed to irrigate their nose frequently with salt water to help remove mucous and blood clots.  The drainage may be dark red or brown, though some fresh blood may be seen intermittently, especially after irrigation.  Do not blow you nose, as bleeding may occur. If you must sneeze, keep your mouth open to allow air to escape through your mouth.  If heavy bleeding occurs: Irrigate the nose with saline to rinse out clots, then spray the nose 3 - 4 times with Afrin Nasal Decongestant Spray.  The spray will constrict the blood vessels to slow bleeding.  Pinch the lower half of your nose shut to apply pressure, and lay down with your head elevated.  Ice packs over the nose may help as well. If bleeding persists despite these measures, you should notify your doctor.  Do not use the Afrin routinely to control nasal congestion after surgery, as it can result in worsening congestion and may affect healing.     Activity: Return to work varies among patients. Most patients will be out   of work at least 5 - 7 days to recover.  Patient may return to work after they are off of narcotic pain medication, and feeling well enough to perform the functions of their job.  Patients must avoid heavy lifting (over 10 pounds) or strenuous physical for 2 weeks after surgery, so your employer may need to assign you to light duty, or keep you out of work longer if light duty is not possible.  NOTE: you should not drive, operate dangerous machinery, do any mentally demanding tasks or make any important legal or financial decisions while on narcotic pain medication and recovering from the general anesthetic.    Call Your Doctor Immediately if You Have Any of the Following: Bleeding that you cannot control with the above measures Loss of vision, double vision, bulging of the eye or black eyes. Fever over 101 degrees Neck stiffness with severe headache,  fever, nausea and change in mental state. You are always encouraged to call anytime with concerns, however, please call with requests for pain medication refills during office hours.  Office Endoscopy: During follow-up visits your doctor will remove any packing or splints that may have been placed and evaluate and clean your sinuses endoscopically.  Topical anesthetic will be used to make this as comfortable as possible, though you may want to take your pain medication prior to the visit.  How often this will need to be done varies from patient to patient.  After complete recovery from the surgery, you may need follow-up endoscopy from time to time, particularly if there is concern of recurrent infection or nasal polyps.  

## 2023-09-07 ENCOUNTER — Ambulatory Visit: Payer: BC Managed Care – PPO | Admitting: Anesthesiology

## 2023-09-07 ENCOUNTER — Other Ambulatory Visit: Payer: Self-pay

## 2023-09-07 ENCOUNTER — Ambulatory Visit
Admission: RE | Admit: 2023-09-07 | Discharge: 2023-09-07 | Disposition: A | Discharge status: Home / Self Care | Payer: BC Managed Care – PPO | Attending: Unknown Physician Specialty | Admitting: Unknown Physician Specialty

## 2023-09-07 ENCOUNTER — Encounter
Admission: RE | Disposition: A | Discharge status: Home / Self Care | Payer: Self-pay | Source: Home / Self Care | Attending: Unknown Physician Specialty

## 2023-09-07 ENCOUNTER — Encounter: Payer: Self-pay | Admitting: Unknown Physician Specialty

## 2023-09-07 DIAGNOSIS — I2510 Atherosclerotic heart disease of native coronary artery without angina pectoris: Secondary | ICD-10-CM | POA: Insufficient documentation

## 2023-09-07 DIAGNOSIS — K219 Gastro-esophageal reflux disease without esophagitis: Secondary | ICD-10-CM | POA: Diagnosis not present

## 2023-09-07 DIAGNOSIS — E119 Type 2 diabetes mellitus without complications: Secondary | ICD-10-CM | POA: Diagnosis not present

## 2023-09-07 DIAGNOSIS — I10 Essential (primary) hypertension: Secondary | ICD-10-CM | POA: Diagnosis not present

## 2023-09-07 DIAGNOSIS — J343 Hypertrophy of nasal turbinates: Secondary | ICD-10-CM | POA: Insufficient documentation

## 2023-09-07 DIAGNOSIS — J3489 Other specified disorders of nose and nasal sinuses: Secondary | ICD-10-CM | POA: Diagnosis present

## 2023-09-07 HISTORY — PX: SEPTOPLASTY: SHX2393

## 2023-09-07 HISTORY — DX: Other ill-defined heart diseases: I51.89

## 2023-09-07 HISTORY — PX: NASAL TURBINATE REDUCTION: SHX2072

## 2023-09-07 HISTORY — DX: Postprocedural hypothyroidism: E89.0

## 2023-09-07 HISTORY — DX: Syncope and collapse: R55

## 2023-09-07 LAB — GLUCOSE, CAPILLARY: Glucose-Capillary: 175 mg/dL — ABNORMAL HIGH (ref 70–99)

## 2023-09-07 SURGERY — SEPTOPLASTY, NOSE
Anesthesia: General | Laterality: Bilateral

## 2023-09-07 MED ORDER — ONDANSETRON HCL 4 MG/2ML IJ SOLN
INTRAMUSCULAR | Status: DC | PRN
  Administered 2023-09-07: 4 mg via INTRAVENOUS

## 2023-09-07 MED ORDER — PROPOFOL 10 MG/ML IV BOLUS
INTRAVENOUS | Status: DC | PRN
  Administered 2023-09-07: 180 mg via INTRAVENOUS

## 2023-09-07 MED ORDER — FENTANYL CITRATE (PF) 100 MCG/2ML IJ SOLN
INTRAMUSCULAR | Status: DC | PRN
  Administered 2023-09-07 (×2): 100 ug via INTRAVENOUS

## 2023-09-07 MED ORDER — SUCCINYLCHOLINE CHLORIDE 200 MG/10ML IV SOSY
PREFILLED_SYRINGE | INTRAVENOUS | Status: AC
  Filled 2023-09-07: qty 10

## 2023-09-07 MED ORDER — ACETAMINOPHEN 10 MG/ML IV SOLN
INTRAVENOUS | Status: AC
  Filled 2023-09-07: qty 100

## 2023-09-07 MED ORDER — SODIUM CHLORIDE 0.9 % IV SOLN: INTRAVENOUS | Status: DC

## 2023-09-07 MED ORDER — LIDOCAINE-EPINEPHRINE 1 %-1:100000 IJ SOLN
INTRAMUSCULAR | Status: DC | PRN
  Administered 2023-09-07: 13 mL

## 2023-09-07 MED ORDER — MIDAZOLAM HCL 2 MG/2ML IJ SOLN
INTRAMUSCULAR | Status: AC
  Filled 2023-09-07: qty 2

## 2023-09-07 MED ORDER — LIDOCAINE HCL (PF) 2 % IJ SOLN
INTRAMUSCULAR | Status: AC
  Filled 2023-09-07: qty 5

## 2023-09-07 MED ORDER — DEXAMETHASONE SODIUM PHOSPHATE 4 MG/ML IJ SOLN
INTRAMUSCULAR | Status: AC
  Filled 2023-09-07: qty 3

## 2023-09-07 MED ORDER — LIDOCAINE HCL (CARDIAC) PF 100 MG/5ML IV SOSY
PREFILLED_SYRINGE | INTRAVENOUS | Status: DC | PRN
  Administered 2023-09-07: 100 mg via INTRAVENOUS

## 2023-09-07 MED ORDER — FENTANYL CITRATE (PF) 100 MCG/2ML IJ SOLN
INTRAMUSCULAR | Status: AC
  Filled 2023-09-07: qty 2

## 2023-09-07 MED ORDER — CLINDAMYCIN HCL 300 MG PO CAPS: 300.0000 mg | ORAL_CAPSULE | Freq: Three times a day (TID) | ORAL | 0 refills | Status: AC

## 2023-09-07 MED ORDER — PHENYLEPHRINE HCL 0.5 % NA SOLN
NASAL | Status: DC | PRN
  Administered 2023-09-07: 30 mL

## 2023-09-07 MED ORDER — OXYMETAZOLINE HCL 0.05 % NA SOLN
NASAL | Status: AC
  Filled 2023-09-07: qty 30

## 2023-09-07 MED ORDER — FENTANYL CITRATE PF 50 MCG/ML IJ SOSY
25.0000 ug | PREFILLED_SYRINGE | INTRAMUSCULAR | Status: DC | PRN
  Administered 2023-09-07: 25 ug via INTRAVENOUS

## 2023-09-07 MED ORDER — DEXAMETHASONE SODIUM PHOSPHATE 4 MG/ML IJ SOLN
INTRAMUSCULAR | Status: DC | PRN
  Administered 2023-09-07: 8 mg via INTRAVENOUS

## 2023-09-07 MED ORDER — GLYCOPYRROLATE 0.2 MG/ML IJ SOLN
INTRAMUSCULAR | Status: DC | PRN
  Administered 2023-09-07: .2 mg via INTRAVENOUS

## 2023-09-07 MED ORDER — NALOXONE HCL 0.4 MG/ML IJ SOLN
INTRAMUSCULAR | Status: DC | PRN
  Administered 2023-09-07: .2 mg via INTRAVENOUS

## 2023-09-07 MED ORDER — ONDANSETRON HCL 4 MG/2ML IJ SOLN
INTRAMUSCULAR | Status: AC
  Filled 2023-09-07: qty 2

## 2023-09-07 MED ORDER — GLYCOPYRROLATE 0.2 MG/ML IJ SOLN
INTRAMUSCULAR | Status: AC
  Filled 2023-09-07: qty 1

## 2023-09-07 MED ORDER — ACETAMINOPHEN 10 MG/ML IV SOLN
1000.0000 mg | Freq: Once | INTRAVENOUS | Status: AC
  Administered 2023-09-07: 1000 mg via INTRAVENOUS

## 2023-09-07 MED ORDER — SUCCINYLCHOLINE CHLORIDE 200 MG/10ML IV SOSY
PREFILLED_SYRINGE | INTRAVENOUS | Status: DC | PRN
  Administered 2023-09-07: 100 mg via INTRAVENOUS

## 2023-09-07 MED ORDER — MIDAZOLAM HCL 5 MG/5ML IJ SOLN
INTRAMUSCULAR | Status: DC | PRN
  Administered 2023-09-07: 2 mg via INTRAVENOUS

## 2023-09-07 MED ORDER — EPHEDRINE 5 MG/ML INJ
INTRAVENOUS | Status: AC
Start: 2023-09-07 — End: ?
  Filled 2023-09-07: qty 5

## 2023-09-07 MED ORDER — OXYMETAZOLINE HCL 0.05 % NA SOLN
6.0000 | Freq: Every day | NASAL | Status: AC
  Administered 2023-09-07: 6 via NASAL

## 2023-09-07 MED ORDER — EPHEDRINE SULFATE (PRESSORS) 50 MG/ML IJ SOLN
INTRAMUSCULAR | Status: DC | PRN
  Administered 2023-09-07: 5 mg via INTRAVENOUS
  Administered 2023-09-07: 10 mg via INTRAVENOUS

## 2023-09-07 MED ORDER — HYDROCODONE-ACETAMINOPHEN 5-325 MG PO TABS: 1.0000 | ORAL_TABLET | Freq: Four times a day (QID) | ORAL | 0 refills | Status: AC | PRN

## 2023-09-07 SURGICAL SUPPLY — 26 items
COAG SUCTION FOOTSWITCH 10FR (SUCTIONS) ×1 IMPLANT
DRAPE HEAD BAR (DRAPES) ×1 IMPLANT
DRESSING NASL FOAM PST OP SINU (MISCELLANEOUS) IMPLANT
DRSG NASAL FOAM POST OP SINU (MISCELLANEOUS) ×2
ELECT REM PT RETURN 9FT ADLT (ELECTROSURGICAL) ×1
ELECTRODE REM PT RTRN 9FT ADLT (ELECTROSURGICAL) ×1 IMPLANT
GAUZE SPONGE 4X4 12PLY STRL (GAUZE/BANDAGES/DRESSINGS) IMPLANT
GLOVE BIO SURGEON STRL SZ7.5 (GLOVE) ×2 IMPLANT
HANDLE YANKAUER SUCT BULB TIP (MISCELLANEOUS) ×1 IMPLANT
KIT TURNOVER KIT A (KITS) ×1 IMPLANT
NDL HYPO 25GX1X1/2 BEV (NEEDLE) ×1 IMPLANT
NEEDLE HYPO 25GX1X1/2 BEV (NEEDLE) ×1 IMPLANT
PACK ENT CUSTOM (PACKS) ×1 IMPLANT
SPLINT NASAL SEPTAL BLV .25 LG (MISCELLANEOUS) IMPLANT
SPLINT NASAL SEPTAL BLV .50 ST (MISCELLANEOUS) IMPLANT
SPONGE NEURO XRAY DETECT 1X3 (DISPOSABLE) ×1 IMPLANT
STRAP BODY AND KNEE 60X3 (MISCELLANEOUS) ×1 IMPLANT
SUT CHROMIC 3-0 KS 27XMFL CR (SUTURE) ×1
SUT CHROMIC 5-0 P2 18XMFL CR (SUTURE)
SUT ETHILON 3-0 KS 30 BLK (SUTURE) ×1 IMPLANT
SUT PLAIN GUT 4-0 (SUTURE) IMPLANT
SUTURE CHRMC 3-0 KS 27XMFL CR (SUTURE) ×1 IMPLANT
SUTURE CHRMC 5-0 P2 18XMF CR (SUTURE) IMPLANT
SYR 10ML LL (SYRINGE) ×1 IMPLANT
TOWEL OR 17X26 4PK STRL BLUE (TOWEL DISPOSABLE) ×1 IMPLANT
WATER STERILE IRR 250ML POUR (IV SOLUTION) ×1 IMPLANT

## 2023-09-07 NOTE — Anesthesia Procedure Notes (Signed)
Procedure Name: Intubation Date/Time: 09/07/2023 11:24 AM  Performed by: Domenic Moras, CRNAPre-anesthesia Checklist: Patient identified, Emergency Drugs available, Suction available and Patient being monitored Patient Re-evaluated:Patient Re-evaluated prior to induction Oxygen Delivery Method: Circle system utilized Preoxygenation: Pre-oxygenation with 100% oxygen Induction Type: IV induction Ventilation: Mask ventilation without difficulty Laryngoscope Size: McGrath and 4 Grade View: Grade I Tube type: Oral Tube size: 7.0 mm Number of attempts: 1 Airway Equipment and Method: Stylet and Oral airway Placement Confirmation: ETT inserted through vocal cords under direct vision, positive ETCO2 and breath sounds checked- equal and bilateral Secured at: 20 cm Tube secured with: Tape Dental Injury: Teeth and Oropharynx as per pre-operative assessment

## 2023-09-07 NOTE — Transfer of Care (Signed)
Immediate Anesthesia Transfer of Care Note  Patient: Nicole Crane  Procedure(s) Performed: SEPTOPLASTY (Bilateral) SUBMUCOSAL RESECTION OF NASAL TURBINATES (Bilateral)  Patient Location: PACU  Anesthesia Type: General ETT  Level of Consciousness: awake, alert  and patient cooperative  Airway and Oxygen Therapy: Patient Spontanous Breathing and Patient connected to supplemental oxygen  Post-op Assessment: Post-op Vital signs reviewed, Patient's Cardiovascular Status Stable, Respiratory Function Stable, Patent Airway and No signs of Nausea or vomiting  Post-op Vital Signs: Reviewed and stable  Complications: No notable events documented.

## 2023-09-07 NOTE — Op Note (Signed)
PREOPERATIVE DIAGNOSIS:  Chronic nasal obstruction.  POSTOPERATIVE DIAGNOSIS:  Chronic nasal obstruction.  SURGEON:  Davina Poke, M.D.  NAME OF PROCEDURE:  Nasal septoplasty. Submucous resection of inferior turbinates.  OPERATIVE FINDINGS:  Severe nasal septal deformity, hypertrophy of the inferior turbinates.   DESCRIPTION OF THE PROCEDURE:  Nicole Crane was identified in the holding area and taken to the operating room and placed in the supine position.  After general endotracheal anesthesia was induced, the table was turned 45 degrees and the patient was placed in a semi-Fowler position.  The nose was then topically anesthetized with Lidocaine, cotton pledgets were placed within each nostril. After approximately 5 minutes, this was removed at which time a local anesthetic of 1% Lidocaine 1:100,000 units of Epinephrine was used to inject the inferior turbinates in the nasal septum. A total of 13 ml was used. Examination of the nose showed a severe left nasal septal deformity and tremendous hypertrophied inferior turbinate.  Beginning on the right hand side a hemitransfixion incision was then created on the leading edge of the septum on the right.  A subperichondrial plane was elevated posteriorly on the left and taken back to the perpendicular plate of the ethmoid where subperiosteal plane was elevated posteriorly on the left. A large septal spur was identified on the left hand side impacting on the inferior turbinate.  An inferior rim of cartilage was removed anteriorly with care taken to leave an anterior strut to prevent nasal collapse. With this strut removed the perpendicular plate of the ethmoid was separated from the quadrangular cartilage. The large septal spur was removed.  The septum was then replaced in the midline. Reinspection through each nostril showed excellent reduction of the septal deformity. A left posterior inferior fenestration was then created to allow hematoma  drainage.  With the septoplasty completed, beginning on the left-hand side, a 15 blade was used to incise along the inferior edge of the inferior turbinate. A superior laterally based flap was then elevated. The underlying conchal bone of mucosa was excised using Knight scissors. The flap was then laid back over the turbinate stump and cauterized using suction cautery. In a similar fashion the submucous resection was performed on the right.  With the submucous resection completed bilaterally and no active bleeding, the hemitransfixion incision was then closed using two interrupted 3-0 chromic sutures.  Plastic nasal septal splints were placed within each nostril and affixed to the septum using a 3-0 nylon suture. Stammberger was then used beneath each inferior turbinate for hemostasis.    The patient tolerated the procedure well, was returned to anesthesia, extubated in the operating room, and taken to the recovery room in stable condition.    CULTURES:  None.  SPECIMENS:  None.  ESTIMATED BLOOD LOSS:  25 cc.  Davina Poke  09/07/2023  12:01 PM

## 2023-09-07 NOTE — H&P (Signed)
The patient's history has been reviewed, patient examined, no change in status, stable for surgery.  Questions were answered to the patients satisfaction.  

## 2023-09-17 NOTE — Anesthesia Postprocedure Evaluation (Signed)
 Anesthesia Post Note  Patient: Nicole Crane  Procedure(s) Performed: SEPTOPLASTY (Bilateral) SUBMUCOSAL RESECTION OF NASAL TURBINATES (Bilateral)  Patient location during evaluation: PACU Anesthesia Type: General Level of consciousness: awake and alert Pain management: pain level controlled Vital Signs Assessment: post-procedure vital signs reviewed and stable Respiratory status: spontaneous breathing, nonlabored ventilation, respiratory function stable and patient connected to nasal cannula oxygen Cardiovascular status: blood pressure returned to baseline and stable Postop Assessment: no apparent nausea or vomiting Anesthetic complications: no   No notable events documented.   Last Vitals:  Vitals:   09/07/23 1245 09/07/23 1301  BP: (!) 160/66 (!) 170/73  Pulse: 81 86  Resp: 14 15  Temp:  36.6 C  SpO2: 94% 97%    Last Pain:  Vitals:   09/10/23 0857  TempSrc:   PainSc: 4                  [name] [name]

## 2024-02-26 LAB — LAB REPORT - SCANNED
A1c: 6.2
Creatinine, POC: 17 mg/dL
Microalb Creat Ratio: <41.2
Microalbumin, Urine: <0.7

## 2024-03-05 ENCOUNTER — Encounter: Payer: Self-pay | Admitting: *Deleted

## 2024-03-09 NOTE — Progress Notes (Deleted)
 Cardiology Office Note  Date:  03/09/2024   ID:  Nicole Crane, DOB Dec 30, 1965, MRN [id number]  PCP:  Arloa Elsie SAUNDERS, MD   No chief complaint on file.   HPI:  Nicole Crane is a 58 y.o. female with history of  coronary artery calcification noted on CT, aortic atherosclerosis,  syncope felt to be vasovagal,  nonsustained PSVT,  HTN, HLD, DM2,  postoperative abdominal blood clots following SBO,  osteoarthritis, degenerative disc disease, GERD, and anxiety  who presents for follow-up of her history of syncope  Last seen by myself in the hospital March 01, 2019 for   hospital on 02/28/2023 through 03/02/2023 with 2 separate syncopal episodes felt secondary to abdominal pain/diarrhea, vasovagal  Echo showed an EF of 60 to 65%, no regional wall motion abnormalities, grade 1 diastolic dysfunction, normal RV systolic function and ventricular cavity size, no significant valvular abnormalities, and an estimated right atrial pressure of 3 mmHg.   CTA chest showed no evidence of PE with mild coronary calcification and mild thoracic aortic atherosclerosis.      Outpatient cardiac  sinus rhythm with an average rate of 76 bpm (range 48 to 182 bpm), 3 episodes of SVT with the fastest and longest interval lasting 7 beats with a maximum rate of 182 bpm, rare atrial and ventricular ectopy, and no significant sustained arrhythmias, prolonged pauses, or evidence of high-grade AV block.  Patient triggered events were associated with sinus rhythm.   On last clinic visit reported 1 further syncopal episodes since hospital discharge that occurred after she discontinued cardiac monitor.   gotten up in the middle the night to urinate and upon completion and standing up, she suffered another syncopal episode.   blood pressure obtained approximately 10 minutes after this was in the 90s over 40s.   She reports a T11-T12 compression fracture following this episode.   She has remained off lisinopril  since  hospital discharge.      PMH:   has a past medical history of Allergic rhinitis, Allergy, Anxiety, Bilateral leg edema, DDD (degenerative disc disease), lumbosacral, Diabetes mellitus without complication (HCC), GERD (gastroesophageal reflux disease), Grade I diastolic dysfunction, High cholesterol, blood clots, Hypertension, Hyperthyroidism, Multiple thyroid  nodules, OA (osteoarthritis) of shoulder, Post-surgical hypothyroidism, Spinal stenosis, Tick bite (02/14/2018), and Vasovagal syncope.  PSH:    Past Surgical History:  Procedure Laterality Date   ABDOMINAL HYSTERECTOMY     complete   BACK SURGERY     BONE MARROW BIOPSY     after BSO to check clotting   CHOLECYSTECTOMY     DILATION AND CURETTAGE OF UTERUS     ENDOMETRIAL ABLATION     EXPLORATORY LAPAROTOMY     BSO   MOUTH SURGERY     NASAL TURBINATE REDUCTION Bilateral 09/07/2023   Procedure: SUBMUCOSAL RESECTION OF NASAL TURBINATES;  Surgeon: Herminio Miu, MD;  Location: [facility];  Service: ENT;  Laterality: Bilateral;  Diabetic   ROTATOR CUFF REPAIR     SEPTOPLASTY Bilateral 09/07/2023   Procedure: SEPTOPLASTY;  Surgeon: Herminio Miu, MD;  Location: [facility];  Service: ENT;  Laterality: Bilateral;   THYROIDECTOMY N/A 02/26/2018   Procedure: TOTAL THYROIDECTOMY;  Surgeon: Eletha Boas, MD;  Location: [facility];  Service: General;  Laterality: N/A;    Current Outpatient Medications  Medication Sig Dispense Refill   albuterol  (PROVENTIL  HFA;VENTOLIN  HFA) 108 (90 Base) MCG/ACT inhaler Inhale 1-2 puffs into the lungs every 6 (six) hours as needed for wheezing or shortness of breath.  ALPRAZolam  (XANAX ) 0.5 MG tablet Take 0.5 mg by mouth at bedtime.     aspirin  EC 81 MG tablet Take 1 tablet (81 mg total) by mouth daily. Swallow whole.     atorvastatin  (LIPITOR) 40 MG tablet Take 40 mg by mouth every evening.     Blood Glucose Monitoring Suppl (ONETOUCH VERIO FLEX SYSTEM) w/Device KIT Use onetouch verio  flex to check blood sugar three times daily. (Patient not taking: Reported on 04/30/2023) 1 kit 0   clindamycin  (CLEOCIN ) 300 MG capsule Take 1 capsule (300 mg total) by mouth 3 (three) times daily. 30 capsule 0   Continuous Glucose Sensor (FREESTYLE LIBRE 3 SENSOR) MISC APPLY EVERY 14 DAYS AS DIRECTED BY YOUR DOCTOR 1 each 3   diclofenac Sodium (VOLTAREN) 1 % GEL Apply 2 g topically 4 (four) times daily. (Patient not taking: Reported on 07/18/2023)     HYDROcodone -acetaminophen  (NORCO) 5-325 MG tablet Take 1-2 tablets by mouth every 6 (six) hours as needed for moderate pain (pain score 4-6). 40 tablet 0   ibuprofen (ADVIL,MOTRIN) 800 MG tablet Take 800 mg by mouth at bedtime.     Insulin  Pen Needle 31G X 5 MM MISC Use to inject insulin  4 times daily. 200 each 2   levothyroxine  (SYNTHROID ) 75 MCG tablet Take 1 tablet (75 mcg total) by mouth daily. 90 tablet 3   lidocaine  (LIDODERM ) 5 % Place 1 patch onto the skin daily. (Patient not taking: Reported on 07/18/2023)     liothyronine  (CYTOMEL ) 5 MCG tablet TAKE 1 TABLET BY MOUTH DAILY 90 tablet 0   lisinopril  (PRINIVIL ,ZESTRIL ) 20 MG tablet Take 20 mg by mouth daily. (Patient not taking: Reported on 07/18/2023)     metFORMIN  (GLUCOPHAGE -XR) 500 MG 24 hr tablet TAKE FOUR TABLETS BY MOUTH DAILY; **MUST CALL MD FOR APPOINTMENT FOR FUTURE REFILLS (Patient taking differently: Take 500 mg by mouth daily.) 60 tablet 0   MOUNJARO  5 MG/0.5ML Pen Inject 5 mg into the skin once a week.     OneTouch Delica Lancets 30G MISC 1 each by Does not apply route 3 (three) times daily. Use Onetouch Delica Lancets to check blood sugar three times daily. (Patient not taking: Reported on 04/30/2023) 100 each 3   ONETOUCH VERIO test strip USE TO CHECK BLOOD SUGAR 3 TIMES DAILY (Patient not taking: Reported on 04/30/2023) 200 strip 2   pantoprazole  (PROTONIX ) 40 MG tablet Take 40 mg by mouth every evening.     traMADol  (ULTRAM ) 50 MG tablet Take 1 tablet (50 mg total) by mouth every 6  (six) hours as needed for moderate pain or severe pain. (Patient not taking: Reported on 07/18/2023) 12 tablet 0   No current facility-administered medications for this visit.     Allergies:   Alcohol; Antihistamines, diphenhydramine-type; Avelox [moxifloxacin hcl in nacl]; Dilaudid [hydromorphone hcl]; Erythromycin base; Kiwi extract; Levofloxacin; Morphine and codeine; Naproxen; Other; Penicillins; Prednisone ; Pregabalin; Statins; Sulfa antibiotics; Victoza [liraglutide]; Xigduo  xr [dapagliflozin  pro-metformin  er]; and Tamiflu [oseltamivir]   Social History:  The patient  reports that she has never smoked. She has never used smokeless tobacco. She reports that she does not drink alcohol and does not use drugs.   Family History:   family history includes Breast cancer in her maternal grandmother and mother; Diabetes in her brother.    Review of Systems: ROS   PHYSICAL EXAM: VS:  There were no vitals taken for this visit. , BMI There is no height or weight on file to calculate BMI. GEN: Well  nourished, well developed, in no acute distress HEENT: normal Neck: no JVD, carotid bruits, or masses Cardiac: RRR; no murmurs, rubs, or gallops,no edema  Respiratory:  clear to auscultation bilaterally, normal work of breathing GI: soft, nontender, nondistended, + BS MS: no deformity or atrophy Skin: warm and dry, no rash Neuro:  Strength and sensation are intact Psych: euthymic mood, full affect    Recent Labs: 05/24/2023: Creatinine, Ser 0.90    Lipid Panel Lab Results  Component Value Date   CHOL 142 02/28/2023   HDL 57 02/28/2023   LDLCALC 70 02/28/2023   TRIG 73 02/28/2023      Wt Readings from Last 3 Encounters:  09/07/23 181 lb 11.2 oz (82.4 kg)  04/30/23 177 lb 9.6 oz (80.6 kg)  03/02/23 179 lb 7.3 oz (81.4 kg)       ASSESSMENT AND PLAN:  Problem List Items Addressed This Visit   None    Disposition:   F/U  12 months   Total encounter time more than 30  minutes  Greater than 50% was spent in counseling and coordination of care with the patient    Signed, Velinda Lunger, M.D., Ph.D. [facility] [place], [place] [phone]

## 2024-03-10 ENCOUNTER — Ambulatory Visit: Payer: Self-pay | Admitting: Cardiovascular Disease

## 2024-03-10 DIAGNOSIS — I700 Atherosclerosis of aorta: Secondary | ICD-10-CM

## 2024-03-10 DIAGNOSIS — E1165 Type 2 diabetes mellitus with hyperglycemia: Secondary | ICD-10-CM

## 2024-03-10 DIAGNOSIS — I2510 Atherosclerotic heart disease of native coronary artery without angina pectoris: Secondary | ICD-10-CM

## 2024-03-10 DIAGNOSIS — E782 Mixed hyperlipidemia: Secondary | ICD-10-CM

## 2024-03-10 DIAGNOSIS — I471 Supraventricular tachycardia, unspecified: Secondary | ICD-10-CM

## 2024-03-10 DIAGNOSIS — R55 Syncope and collapse: Secondary | ICD-10-CM

## 2024-04-08 ENCOUNTER — Other Ambulatory Visit: Payer: Self-pay

## 2024-05-08 NOTE — Progress Notes (Unsigned)
 Cardiology Office Note  Date:  05/09/2024   ID:  Nicole Crane, DOB 14-Dec-1965, MRN [id number]  PCP:  Arloa Elsie SAUNDERS, MD   Chief Complaint  Patient presents with   6 month follow up     Doing well.     HPI:  Nicole Crane a 58 y.o. femalewith past medical history of: aortic atherosclerosis, syncope felt to be vasovagal,  nonsustained PSVT,  HTN,  HLD,  DM2,  CTA: Coronary calcium  score of 184.  Mild proximal LAD and RCA stenosis (25-49%).  postoperative abdominal blood clots following SBO, osteoarthritis, degenerative disc disease, GERD, and anxiety who  Presents for follow-up of her syncope, coronary artery disease  Cardiac CTA September 2024   2 prior episodes of syncope  admitted to the hospital on 02/28/2023 through 03/02/2023  First episode vasovagal syncope in the setting of GI distress, diarrhea  falling over  in the living room.   Upon coming to, in the setting of severe abdominal pain and went to the restroom where she fell to her knees and fell on the floor and was unresponsive.   out for approximately 1 minute to 1-1/2 minutes.   On waking she had diarrhea.    Echo showed an EF of 60 to 65%,   X-ray detailing nondisplaced fracture along the right side of the bridge of the nose, requiring surgery  Syncope consistent with vasovagal response in the setting of abdominal discomfort and diarrhea.   Outpatient cardiac monitoring with no significant arrhythmia  Patient triggered events were associated with sinus rhythm.    1 further syncopal episodes since hospital discharge that occurred after she had gotten up in the middle the night to urinate and upon completion and standing up, she suffered another syncopal episode.    T11-T12 compression fracture following this episode.  She has remained off lisinopril  since hospital discharge.    history of whitecoat hypertension.  She has prescription for lisinopril  5 but has not started  EKG personally  reviewed by myself on todays visit EKG Interpretation Date/Time:  Friday May 09 2024 16:03:47 EDT Ventricular Rate:  82 PR Interval:  170 QRS Duration:  88 QT Interval:  404 QTC Calculation: 472 R Axis:   19  Text Interpretation: Normal sinus rhythm Normal ECG When compared with ECG of 30-Apr-2023 10:12, Criteria for Septal infarct are no longer Present Confirmed by Perla Lye [phone]) on 05/09/2024 4:10:18 PM    PMH:   has a past medical history of Allergic rhinitis, Allergy, Anxiety, Bilateral leg edema, DDD (degenerative disc disease), lumbosacral, Diabetes mellitus without complication (HCC), GERD (gastroesophageal reflux disease), Grade I diastolic dysfunction, High cholesterol, blood clots, Hypertension, Hyperthyroidism, Multiple thyroid  nodules, OA (osteoarthritis) of shoulder, Post-surgical hypothyroidism, Spinal stenosis, Tick bite (02/14/2018), and Vasovagal syncope.  PSH:    Past Surgical History:  Procedure Laterality Date   ABDOMINAL HYSTERECTOMY     complete   BACK SURGERY     BONE MARROW BIOPSY     after BSO to check clotting   CHOLECYSTECTOMY     DILATION AND CURETTAGE OF UTERUS     ENDOMETRIAL ABLATION     EXPLORATORY LAPAROTOMY     BSO   MOUTH SURGERY     NASAL TURBINATE REDUCTION Bilateral 09/07/2023   Procedure: SUBMUCOSAL RESECTION OF NASAL TURBINATES;  Surgeon: Herminio Miu, MD;  Location: [facility];  Service: ENT;  Laterality: Bilateral;  Diabetic   ROTATOR CUFF REPAIR     SEPTOPLASTY Bilateral 09/07/2023   Procedure: SEPTOPLASTY;  Surgeon: Herminio Miu, MD;  Location: [facility];  Service: ENT;  Laterality: Bilateral;   THYROIDECTOMY N/A 02/26/2018   Procedure: TOTAL THYROIDECTOMY;  Surgeon: Eletha Boas, MD;  Location: [facility];  Service: General;  Laterality: N/A;    Current Outpatient Medications  Medication Sig Dispense Refill   albuterol  (PROVENTIL  HFA;VENTOLIN  HFA) 108 (90 Base) MCG/ACT inhaler Inhale 1-2 puffs into  the lungs every 6 (six) hours as needed for wheezing or shortness of breath.     ALPRAZolam  (XANAX ) 0.5 MG tablet Take 0.5 mg by mouth at bedtime.     atorvastatin  (LIPITOR) 40 MG tablet Take 40 mg by mouth every evening.     diclofenac Sodium (VOLTAREN) 1 % GEL Apply 2 g topically 4 (four) times daily.     ibuprofen (ADVIL,MOTRIN) 800 MG tablet Take 800 mg by mouth at bedtime.     levothyroxine  (SYNTHROID ) 75 MCG tablet Take 1 tablet (75 mcg total) by mouth daily. 90 tablet 3   lidocaine  (LIDODERM ) 5 % Place 1 patch onto the skin daily.     liothyronine  (CYTOMEL ) 5 MCG tablet TAKE 1 TABLET BY MOUTH DAILY 90 tablet 0   metFORMIN  (GLUCOPHAGE -XR) 500 MG 24 hr tablet TAKE FOUR TABLETS BY MOUTH DAILY; **MUST CALL MD FOR APPOINTMENT FOR FUTURE REFILLS 60 tablet 0   MOUNJARO  5 MG/0.5ML Pen Inject 5 mg into the skin once a week.     ONETOUCH VERIO test strip USE TO CHECK BLOOD SUGAR 3 TIMES DAILY 200 strip 2   pantoprazole  (PROTONIX ) 40 MG tablet Take 40 mg by mouth every evening.     Vitamin D, Ergocalciferol, (DRISDOL) 1.25 MG (50000 UNIT) CAPS capsule Take 50,000 Units by mouth every 7 (seven) days.     clindamycin  (CLEOCIN ) 300 MG capsule Take 1 capsule (300 mg total) by mouth 3 (three) times daily. 30 capsule 0   Continuous Glucose Sensor (FREESTYLE LIBRE 3 SENSOR) MISC APPLY EVERY 14 DAYS AS DIRECTED BY YOUR DOCTOR (Patient not taking: Reported on 05/09/2024) 1 each 3   HYDROcodone -acetaminophen  (NORCO) 5-325 MG tablet Take 1-2 tablets by mouth every 6 (six) hours as needed for moderate pain (pain score 4-6). 40 tablet 0   OneTouch Delica Lancets 30G MISC 1 each by Does not apply route 3 (three) times daily. Use Onetouch Delica Lancets to check blood sugar three times daily. (Patient not taking: Reported on 04/30/2023) 100 each 3   traMADol  (ULTRAM ) 50 MG tablet Take 1 tablet (50 mg total) by mouth every 6 (six) hours as needed for moderate pain or severe pain. (Patient not taking: Reported on  07/18/2023) 12 tablet 0   No current facility-administered medications for this visit.    Allergies:   Alcohol; Antihistamines, diphenhydramine-type; Avelox [moxifloxacin hcl in nacl]; Dapagliflozin  pro-metformin  er; Dilaudid [hydromorphone hcl]; Erythromycin base; Kiwi extract; Levofloxacin; Liraglutide; Morphine and codeine; Naproxen; Other; Penicillin v; Penicillins; Pravastatin; Prednisone ; Pregabalin; Simvastatin; Statins; Sulfa antibiotics; Amoxicillin; Oseltamivir; Penicillin v potassium; and Sulfamethoxazole   Social History:  The patient  reports that she has never smoked. She has never used smokeless tobacco. She reports that she does not drink alcohol and does not use drugs.   Family History:   family history includes Breast cancer in her maternal grandmother and mother; Diabetes in her brother; Heart attack in her brother; Heart attack (age of onset: 21) in her paternal grandmother; Heart attack (age of onset: 68) in her maternal grandfather; Heart disease in her brother; Heart failure in her brother.    Review  of Systems: Review of Systems  Constitutional: Negative.   HENT: Negative.    Respiratory: Negative.    Cardiovascular: Negative.   Gastrointestinal: Negative.   Musculoskeletal: Negative.   Neurological: Negative.   Psychiatric/Behavioral: Negative.    All other systems reviewed and are negative.   PHYSICAL EXAM: VS:  BP (!) 140/84 (BP Location: Left Arm, Patient Position: Sitting, Cuff Size: Normal)   Pulse 81   Ht 5' 4.5 (1.638 m)   Wt 180 lb 8 oz (81.9 kg)   SpO2 98%   BMI 30.50 kg/m  , BMI Body mass index is 30.5 kg/m. GEN: Well nourished, well developed, in no acute distress HEENT: normal Neck: no JVD, carotid bruits, or masses Cardiac: RRR; no murmurs, rubs, or gallops,no edema  Respiratory:  clear to auscultation bilaterally, normal work of breathing GI: soft, nontender, nondistended, + BS MS: no deformity or atrophy Skin: warm and dry, no  rash Neuro:  Strength and sensation are intact Psych: euthymic mood, full affect   Recent Labs: 05/24/2023: Creatinine, Ser 0.90    Lipid Panel Lab Results  Component Value Date   CHOL 142 02/28/2023   HDL 57 02/28/2023   LDLCALC 70 02/28/2023   TRIG 73 02/28/2023      Wt Readings from Last 3 Encounters:  05/09/24 180 lb 8 oz (81.9 kg)  09/07/23 181 lb 11.2 oz (82.4 kg)  04/30/23 177 lb 9.6 oz (80.6 kg)     ASSESSMENT AND PLAN:  Problem List Items Addressed This Visit     Uncontrolled type 2 diabetes mellitus with hyperglycemia, without long-term current use of insulin  (HCC)   Other Visit Diagnoses       Syncope and collapse    -  Primary   Relevant Orders   EKG 12-Lead (Completed)     Coronary artery calcification       Relevant Orders   EKG 12-Lead (Completed)     Aortic atherosclerosis (HCC)       Relevant Orders   EKG 12-Lead (Completed)     Hyperlipidemia LDL goal <70         Essential hypertension       Relevant Orders   EKG 12-Lead (Completed)     PSVT (paroxysmal supraventricular tachycardia) (HCC)       Relevant Orders   EKG 12-Lead (Completed)      Syncope Prior episodes in the setting of GI distress, post micturition consistent with vasovagal syncope -Stressed the importance of hydration, will avoid over aggressive management of blood pressure - For now we have recommended she hold off on lisinopril   Aortic atherosclerosis Continue Lipitor 40 daily, aspirin  81 daily  Coronary artery disease with stable angina Currently with no symptoms of angina. No further workup at this time. Continue current medication regimen.  Hyperlipidemia Cholesterol is at goal on the current lipid regimen. No changes to the medications were made.  Diabetes type 2 A1c June 2024 5.7 Managed by primary care  Signed, Velinda Lunger, M.D., Ph.D. [facility] [place], [place] [phone]

## 2024-05-09 ENCOUNTER — Encounter: Payer: Self-pay | Admitting: Cardiovascular Disease

## 2024-05-09 ENCOUNTER — Ambulatory Visit: Attending: Cardiovascular Disease | Admitting: Cardiovascular Disease

## 2024-05-09 VITALS — BP 140/84 | HR 81 | Ht 64.5 in | Wt 180.5 lb

## 2024-05-09 DIAGNOSIS — I700 Atherosclerosis of aorta: Secondary | ICD-10-CM | POA: Diagnosis not present

## 2024-05-09 DIAGNOSIS — E785 Hyperlipidemia, unspecified: Secondary | ICD-10-CM | POA: Diagnosis not present

## 2024-05-09 DIAGNOSIS — I471 Supraventricular tachycardia, unspecified: Secondary | ICD-10-CM

## 2024-05-09 DIAGNOSIS — I2510 Atherosclerotic heart disease of native coronary artery without angina pectoris: Secondary | ICD-10-CM

## 2024-05-09 DIAGNOSIS — R55 Syncope and collapse: Secondary | ICD-10-CM | POA: Diagnosis not present

## 2024-05-09 DIAGNOSIS — E1165 Type 2 diabetes mellitus with hyperglycemia: Secondary | ICD-10-CM

## 2024-05-09 DIAGNOSIS — I10 Essential (primary) hypertension: Secondary | ICD-10-CM

## 2024-05-09 NOTE — Patient Instructions (Signed)
